# Patient Record
Sex: Male | Born: 1950
Health system: Southern US, Community
[De-identification: ages and names within clinical notes are randomized; demographics above are authoritative.]

## PROBLEM LIST (undated history)

## (undated) DIAGNOSIS — B20 Human immunodeficiency virus [HIV] disease: Secondary | ICD-10-CM

## (undated) DIAGNOSIS — E119 Type 2 diabetes mellitus without complications: Secondary | ICD-10-CM

## (undated) DIAGNOSIS — E7800 Pure hypercholesterolemia, unspecified: Secondary | ICD-10-CM

## (undated) DIAGNOSIS — I4891 Unspecified atrial fibrillation: Secondary | ICD-10-CM

## (undated) DIAGNOSIS — I10 Essential (primary) hypertension: Secondary | ICD-10-CM

## (undated) DIAGNOSIS — Z21 Asymptomatic human immunodeficiency virus [HIV] infection status: Secondary | ICD-10-CM

## (undated) DIAGNOSIS — I2510 Atherosclerotic heart disease of native coronary artery without angina pectoris: Secondary | ICD-10-CM

## (undated) HISTORY — PX: CIRCUMCISION: SUR203

## (undated) HISTORY — PX: CARDIAC SURGERY: SHX584

## (undated) HISTORY — PX: CORONARY ARTERY BYPASS GRAFT: SHX141

## (undated) HISTORY — DX: Unspecified atrial fibrillation: I48.91

## (undated) HISTORY — DX: Atherosclerotic heart disease of native coronary artery without angina pectoris: I25.10

---

## 1999-06-02 ENCOUNTER — Encounter (INDEPENDENT_AMBULATORY_CARE_PROVIDER_SITE_OTHER): Payer: Self-pay | Admitting: *Deleted

## 1999-06-02 ENCOUNTER — Inpatient Hospital Stay (HOSPITAL_COMMUNITY): Admission: EM | Admit: 1999-06-02 | Discharge: 1999-06-16 | Payer: Self-pay | Admitting: *Deleted

## 1999-06-02 LAB — CONVERTED CEMR LAB
CD4 Count: 40 microliters
CD4 T Cell Abs: 40

## 1999-06-03 ENCOUNTER — Encounter: Payer: Self-pay | Admitting: *Deleted

## 1999-06-03 ENCOUNTER — Encounter: Payer: Self-pay | Admitting: Thoracic Surgery (Cardiothoracic Vascular Surgery)

## 1999-06-04 ENCOUNTER — Encounter: Payer: Self-pay | Admitting: Thoracic Surgery (Cardiothoracic Vascular Surgery)

## 1999-06-05 ENCOUNTER — Encounter: Payer: Self-pay | Admitting: Thoracic Surgery (Cardiothoracic Vascular Surgery)

## 1999-06-07 ENCOUNTER — Encounter: Payer: Self-pay | Admitting: Thoracic Surgery (Cardiothoracic Vascular Surgery)

## 1999-06-09 ENCOUNTER — Encounter (HOSPITAL_COMMUNITY): Payer: Self-pay | Admitting: Dentistry

## 1999-06-09 ENCOUNTER — Encounter: Payer: Self-pay | Admitting: Thoracic Surgery (Cardiothoracic Vascular Surgery)

## 1999-06-12 ENCOUNTER — Encounter: Payer: Self-pay | Admitting: Cardiothoracic Surgery

## 1999-06-15 ENCOUNTER — Encounter: Payer: Self-pay | Admitting: Internal Medicine

## 1999-07-01 ENCOUNTER — Encounter (HOSPITAL_COMMUNITY): Admission: RE | Admit: 1999-07-01 | Discharge: 1999-09-29 | Payer: Self-pay | Admitting: Dentistry

## 1999-08-02 ENCOUNTER — Encounter: Admission: RE | Admit: 1999-08-02 | Discharge: 1999-08-02 | Payer: Self-pay | Admitting: Infectious Diseases

## 1999-08-02 ENCOUNTER — Ambulatory Visit (HOSPITAL_COMMUNITY): Admission: RE | Admit: 1999-08-02 | Discharge: 1999-08-02 | Payer: Self-pay | Admitting: Infectious Diseases

## 1999-08-18 ENCOUNTER — Encounter: Admission: RE | Admit: 1999-08-18 | Discharge: 1999-08-18 | Payer: Self-pay | Admitting: Infectious Diseases

## 1999-09-29 ENCOUNTER — Encounter: Admission: RE | Admit: 1999-09-29 | Discharge: 1999-09-29 | Payer: Self-pay | Admitting: Infectious Diseases

## 1999-09-29 ENCOUNTER — Ambulatory Visit (HOSPITAL_COMMUNITY): Admission: RE | Admit: 1999-09-29 | Discharge: 1999-09-29 | Payer: Self-pay | Admitting: Infectious Diseases

## 1999-12-01 ENCOUNTER — Encounter: Admission: RE | Admit: 1999-12-01 | Discharge: 1999-12-01 | Payer: Self-pay | Admitting: Infectious Diseases

## 2000-01-06 ENCOUNTER — Encounter: Admission: RE | Admit: 2000-01-06 | Discharge: 2000-01-06 | Payer: Self-pay | Admitting: Infectious Diseases

## 2000-01-16 ENCOUNTER — Ambulatory Visit (HOSPITAL_COMMUNITY): Admission: RE | Admit: 2000-01-16 | Discharge: 2000-01-16 | Payer: Self-pay | Admitting: Infectious Diseases

## 2000-01-16 ENCOUNTER — Encounter: Admission: RE | Admit: 2000-01-16 | Discharge: 2000-01-16 | Payer: Self-pay | Admitting: Infectious Diseases

## 2000-02-09 ENCOUNTER — Encounter: Admission: RE | Admit: 2000-02-09 | Discharge: 2000-02-09 | Payer: Self-pay | Admitting: Infectious Diseases

## 2000-06-19 ENCOUNTER — Encounter: Admission: RE | Admit: 2000-06-19 | Discharge: 2000-06-19 | Payer: Self-pay | Admitting: Infectious Diseases

## 2000-06-19 ENCOUNTER — Ambulatory Visit (HOSPITAL_COMMUNITY): Admission: RE | Admit: 2000-06-19 | Discharge: 2000-06-19 | Payer: Self-pay | Admitting: Infectious Diseases

## 2000-07-03 ENCOUNTER — Encounter: Admission: RE | Admit: 2000-07-03 | Discharge: 2000-07-03 | Payer: Self-pay | Admitting: Infectious Diseases

## 2000-07-05 ENCOUNTER — Encounter: Admission: RE | Admit: 2000-07-05 | Discharge: 2000-07-05 | Payer: Self-pay | Admitting: Infectious Diseases

## 2000-09-20 ENCOUNTER — Encounter: Admission: RE | Admit: 2000-09-20 | Discharge: 2000-09-20 | Payer: Self-pay | Admitting: Infectious Diseases

## 2000-09-20 ENCOUNTER — Ambulatory Visit (HOSPITAL_COMMUNITY): Admission: RE | Admit: 2000-09-20 | Discharge: 2000-09-20 | Payer: Self-pay | Admitting: Infectious Diseases

## 2000-10-11 ENCOUNTER — Encounter: Admission: RE | Admit: 2000-10-11 | Discharge: 2000-10-11 | Payer: Self-pay | Admitting: Infectious Diseases

## 2001-02-07 ENCOUNTER — Encounter: Admission: RE | Admit: 2001-02-07 | Discharge: 2001-02-07 | Payer: Self-pay | Admitting: Infectious Diseases

## 2001-03-21 ENCOUNTER — Ambulatory Visit (HOSPITAL_COMMUNITY): Admission: RE | Admit: 2001-03-21 | Discharge: 2001-03-21 | Payer: Self-pay | Admitting: Infectious Diseases

## 2001-03-21 ENCOUNTER — Encounter: Admission: RE | Admit: 2001-03-21 | Discharge: 2001-03-21 | Payer: Self-pay | Admitting: Infectious Diseases

## 2001-04-04 ENCOUNTER — Encounter: Admission: RE | Admit: 2001-04-04 | Discharge: 2001-04-04 | Payer: Self-pay | Admitting: Infectious Diseases

## 2001-06-20 ENCOUNTER — Encounter: Admission: RE | Admit: 2001-06-20 | Discharge: 2001-06-20 | Payer: Self-pay | Admitting: Internal Medicine

## 2001-06-20 ENCOUNTER — Ambulatory Visit (HOSPITAL_COMMUNITY): Admission: RE | Admit: 2001-06-20 | Discharge: 2001-06-20 | Payer: Self-pay | Admitting: Infectious Diseases

## 2002-04-16 ENCOUNTER — Encounter: Admission: RE | Admit: 2002-04-16 | Discharge: 2002-04-16 | Payer: Self-pay | Admitting: Infectious Diseases

## 2002-05-01 ENCOUNTER — Encounter: Admission: RE | Admit: 2002-05-01 | Discharge: 2002-05-01 | Payer: Self-pay | Admitting: Infectious Diseases

## 2002-10-09 ENCOUNTER — Encounter: Admission: RE | Admit: 2002-10-09 | Discharge: 2002-10-09 | Payer: Self-pay | Admitting: Infectious Diseases

## 2002-10-09 ENCOUNTER — Encounter: Payer: Self-pay | Admitting: Infectious Diseases

## 2002-10-09 ENCOUNTER — Ambulatory Visit (HOSPITAL_COMMUNITY): Admission: RE | Admit: 2002-10-09 | Discharge: 2002-10-09 | Payer: Self-pay | Admitting: Infectious Diseases

## 2003-03-13 ENCOUNTER — Encounter: Admission: RE | Admit: 2003-03-13 | Discharge: 2003-03-13 | Payer: Self-pay | Admitting: Infectious Diseases

## 2003-03-27 ENCOUNTER — Encounter: Admission: RE | Admit: 2003-03-27 | Discharge: 2003-03-27 | Payer: Self-pay | Admitting: Infectious Diseases

## 2003-06-25 ENCOUNTER — Encounter: Admission: RE | Admit: 2003-06-25 | Discharge: 2003-06-25 | Payer: Self-pay | Admitting: Infectious Diseases

## 2003-06-25 ENCOUNTER — Ambulatory Visit (HOSPITAL_COMMUNITY): Admission: RE | Admit: 2003-06-25 | Discharge: 2003-06-25 | Payer: Self-pay | Admitting: Infectious Diseases

## 2003-07-09 ENCOUNTER — Encounter: Admission: RE | Admit: 2003-07-09 | Discharge: 2003-07-09 | Payer: Self-pay | Admitting: Infectious Diseases

## 2003-10-08 ENCOUNTER — Ambulatory Visit (HOSPITAL_COMMUNITY): Admission: RE | Admit: 2003-10-08 | Discharge: 2003-10-08 | Payer: Self-pay | Admitting: Infectious Diseases

## 2003-10-08 ENCOUNTER — Encounter: Admission: RE | Admit: 2003-10-08 | Discharge: 2003-10-08 | Payer: Self-pay | Admitting: Infectious Diseases

## 2004-04-07 ENCOUNTER — Ambulatory Visit (HOSPITAL_COMMUNITY): Admission: RE | Admit: 2004-04-07 | Discharge: 2004-04-07 | Payer: Self-pay | Admitting: Infectious Diseases

## 2004-04-07 ENCOUNTER — Ambulatory Visit: Payer: Self-pay | Admitting: Internal Medicine

## 2004-04-21 ENCOUNTER — Ambulatory Visit: Payer: Self-pay | Admitting: Infectious Diseases

## 2004-07-11 ENCOUNTER — Ambulatory Visit: Payer: Self-pay | Admitting: Infectious Diseases

## 2004-08-08 ENCOUNTER — Ambulatory Visit: Payer: Self-pay | Admitting: Infectious Diseases

## 2004-08-08 ENCOUNTER — Ambulatory Visit (HOSPITAL_COMMUNITY): Admission: RE | Admit: 2004-08-08 | Discharge: 2004-08-08 | Payer: Self-pay | Admitting: Infectious Diseases

## 2004-09-01 ENCOUNTER — Ambulatory Visit: Payer: Self-pay | Admitting: Infectious Diseases

## 2004-12-16 ENCOUNTER — Ambulatory Visit (HOSPITAL_COMMUNITY): Admission: RE | Admit: 2004-12-16 | Discharge: 2004-12-16 | Payer: Self-pay | Admitting: Infectious Diseases

## 2004-12-16 ENCOUNTER — Ambulatory Visit: Payer: Self-pay | Admitting: Infectious Diseases

## 2005-02-02 ENCOUNTER — Ambulatory Visit: Payer: Self-pay | Admitting: Infectious Diseases

## 2005-05-03 ENCOUNTER — Ambulatory Visit: Payer: Self-pay | Admitting: Infectious Diseases

## 2005-05-03 ENCOUNTER — Encounter: Admission: RE | Admit: 2005-05-03 | Discharge: 2005-05-03 | Payer: Self-pay | Admitting: Infectious Diseases

## 2005-05-18 ENCOUNTER — Ambulatory Visit: Payer: Self-pay | Admitting: Infectious Diseases

## 2005-08-09 ENCOUNTER — Encounter (INDEPENDENT_AMBULATORY_CARE_PROVIDER_SITE_OTHER): Payer: Self-pay | Admitting: *Deleted

## 2005-08-09 ENCOUNTER — Ambulatory Visit: Payer: Self-pay | Admitting: Infectious Diseases

## 2005-08-09 ENCOUNTER — Encounter: Admission: RE | Admit: 2005-08-09 | Discharge: 2005-08-09 | Payer: Self-pay | Admitting: Infectious Diseases

## 2005-10-19 ENCOUNTER — Ambulatory Visit: Payer: Self-pay | Admitting: Infectious Diseases

## 2005-11-09 ENCOUNTER — Encounter: Admission: RE | Admit: 2005-11-09 | Discharge: 2005-11-09 | Payer: Self-pay | Admitting: Infectious Diseases

## 2005-11-09 ENCOUNTER — Encounter (INDEPENDENT_AMBULATORY_CARE_PROVIDER_SITE_OTHER): Payer: Self-pay | Admitting: *Deleted

## 2005-11-09 ENCOUNTER — Ambulatory Visit: Payer: Self-pay | Admitting: Infectious Diseases

## 2005-11-09 LAB — CONVERTED CEMR LAB: CD4 Count: 440 microliters

## 2005-11-30 ENCOUNTER — Ambulatory Visit: Payer: Self-pay | Admitting: Infectious Diseases

## 2006-03-03 DIAGNOSIS — B182 Chronic viral hepatitis C: Secondary | ICD-10-CM | POA: Insufficient documentation

## 2006-03-03 DIAGNOSIS — I2510 Atherosclerotic heart disease of native coronary artery without angina pectoris: Secondary | ICD-10-CM

## 2006-03-03 DIAGNOSIS — B20 Human immunodeficiency virus [HIV] disease: Secondary | ICD-10-CM

## 2006-03-03 DIAGNOSIS — E119 Type 2 diabetes mellitus without complications: Secondary | ICD-10-CM | POA: Insufficient documentation

## 2006-03-03 DIAGNOSIS — F17200 Nicotine dependence, unspecified, uncomplicated: Secondary | ICD-10-CM

## 2006-03-14 ENCOUNTER — Encounter: Payer: Self-pay | Admitting: Infectious Diseases

## 2006-03-27 ENCOUNTER — Encounter (INDEPENDENT_AMBULATORY_CARE_PROVIDER_SITE_OTHER): Payer: Self-pay | Admitting: *Deleted

## 2006-03-27 ENCOUNTER — Ambulatory Visit: Payer: Self-pay | Admitting: Infectious Diseases

## 2006-03-27 ENCOUNTER — Encounter: Admission: RE | Admit: 2006-03-27 | Discharge: 2006-03-27 | Payer: Self-pay | Admitting: Infectious Diseases

## 2006-03-27 LAB — CONVERTED CEMR LAB
Basophils Absolute: 0 10*3/uL (ref 0.0–0.1)
CD4 Count: 310 microliters
CO2: 27 meq/L (ref 19–32)
Calcium: 9.2 mg/dL (ref 8.4–10.5)
Glucose, Bld: 152 mg/dL — ABNORMAL HIGH (ref 70–99)
HCT: 39.7 % (ref 39.0–52.0)
Hemoglobin, Urine: NEGATIVE
Hemoglobin: 14 g/dL (ref 13.0–17.0)
Leukocytes, UA: NEGATIVE
Lymphocytes Relative: 40 % (ref 12–46)
Lymphs Abs: 1 10*3/uL (ref 0.7–3.3)
MCHC: 35.3 g/dL (ref 30.0–36.0)
MCV: 112.5 fL — ABNORMAL HIGH (ref 78.0–100.0)
Monocytes Absolute: 0.2 10*3/uL (ref 0.2–0.7)
Neutrophils Relative %: 38 % — ABNORMAL LOW (ref 43–77)
Platelets: 144 10*3/uL — ABNORMAL LOW (ref 150–400)
Potassium: 4.1 meq/L (ref 3.5–5.3)
Protein, ur: NEGATIVE mg/dL
RBC: 3.53 M/uL — ABNORMAL LOW (ref 4.22–5.81)
RDW: 13.6 % (ref 11.5–14.0)
Sodium: 141 meq/L (ref 135–145)
Specific Gravity, Urine: 1.025 (ref 1.005–1.03)
Total Bilirubin: 0.8 mg/dL (ref 0.3–1.2)
Total Protein: 7.4 g/dL (ref 6.0–8.3)
Urine Glucose: NEGATIVE mg/dL
Urobilinogen, UA: 0.2 (ref 0.0–1.0)
VLDL: 26 mg/dL (ref 0–40)

## 2006-04-12 ENCOUNTER — Ambulatory Visit: Payer: Self-pay | Admitting: Infectious Diseases

## 2006-04-16 ENCOUNTER — Encounter (INDEPENDENT_AMBULATORY_CARE_PROVIDER_SITE_OTHER): Payer: Self-pay | Admitting: *Deleted

## 2006-04-16 LAB — CONVERTED CEMR LAB

## 2006-04-29 ENCOUNTER — Encounter (INDEPENDENT_AMBULATORY_CARE_PROVIDER_SITE_OTHER): Payer: Self-pay | Admitting: *Deleted

## 2006-05-02 ENCOUNTER — Telehealth: Payer: Self-pay | Admitting: Infectious Diseases

## 2006-06-29 ENCOUNTER — Encounter: Payer: Self-pay | Admitting: Infectious Diseases

## 2006-09-06 ENCOUNTER — Encounter (INDEPENDENT_AMBULATORY_CARE_PROVIDER_SITE_OTHER): Payer: Self-pay | Admitting: *Deleted

## 2006-11-06 ENCOUNTER — Telehealth: Payer: Self-pay | Admitting: Infectious Diseases

## 2006-12-20 ENCOUNTER — Telehealth: Payer: Self-pay | Admitting: Infectious Diseases

## 2007-01-24 ENCOUNTER — Encounter: Payer: Self-pay | Admitting: Infectious Diseases

## 2007-01-29 ENCOUNTER — Encounter: Payer: Self-pay | Admitting: Infectious Diseases

## 2007-02-20 ENCOUNTER — Encounter (INDEPENDENT_AMBULATORY_CARE_PROVIDER_SITE_OTHER): Payer: Self-pay | Admitting: *Deleted

## 2007-05-15 ENCOUNTER — Encounter (INDEPENDENT_AMBULATORY_CARE_PROVIDER_SITE_OTHER): Payer: Self-pay | Admitting: *Deleted

## 2007-05-15 ENCOUNTER — Ambulatory Visit: Payer: Self-pay | Admitting: Infectious Diseases

## 2007-05-15 ENCOUNTER — Encounter: Admission: RE | Admit: 2007-05-15 | Discharge: 2007-05-15 | Payer: Self-pay | Admitting: Infectious Diseases

## 2007-05-15 LAB — CONVERTED CEMR LAB
Alkaline Phosphatase: 59 units/L (ref 39–117)
Basophils Absolute: 0 10*3/uL (ref 0.0–0.1)
Basophils Relative: 0 % (ref 0–1)
Bilirubin Urine: NEGATIVE
Chloride: 107 meq/L (ref 96–112)
Eosinophils Relative: 3 % (ref 0–5)
HDL: 74 mg/dL (ref >39)
Hemoglobin, Urine: NEGATIVE
Hemoglobin: 12 g/dL — ABNORMAL LOW (ref 13.0–17.0)
Ketones, ur: NEGATIVE mg/dL
LDL Cholesterol: 69 mg/dL (ref 0–99)
Lymphocytes Relative: 34 % (ref 12–46)
Lymphs Abs: 0.9 10*3/uL (ref 0.7–4.0)
MCV: 113.3 fL — ABNORMAL HIGH (ref 78.0–100.0)
Platelets: 108 10*3/uL — ABNORMAL LOW (ref 150–400)
Potassium: 4.1 meq/L (ref 3.5–5.3)
Protein, ur: NEGATIVE mg/dL
RBC: 3.01 M/uL — ABNORMAL LOW (ref 4.22–5.81)
RDW: 13.6 % (ref 11.5–15.5)
Sodium: 141 meq/L (ref 135–145)
Total Bilirubin: 0.9 mg/dL (ref 0.3–1.2)
Triglycerides: 29 mg/dL (ref <150)
Urobilinogen, UA: 1 (ref 0.0–1.0)
VLDL: 6 mg/dL (ref 0–40)
WBC: 2.6 10*3/uL — ABNORMAL LOW (ref 4.0–10.5)

## 2007-06-04 ENCOUNTER — Encounter (INDEPENDENT_AMBULATORY_CARE_PROVIDER_SITE_OTHER): Payer: Self-pay | Admitting: *Deleted

## 2007-07-16 ENCOUNTER — Telehealth: Payer: Self-pay | Admitting: Infectious Diseases

## 2007-07-17 ENCOUNTER — Telehealth: Payer: Self-pay | Admitting: Infectious Diseases

## 2007-09-11 ENCOUNTER — Encounter: Payer: Self-pay | Admitting: Infectious Diseases

## 2007-10-24 ENCOUNTER — Ambulatory Visit: Payer: Self-pay | Admitting: Infectious Diseases

## 2007-10-24 LAB — CONVERTED CEMR LAB
AST: 38 units/L — ABNORMAL HIGH (ref 0–37)
Albumin: 4.1 g/dL (ref 3.5–5.2)
Alkaline Phosphatase: 54 units/L (ref 39–117)
BUN: 21 mg/dL (ref 6–23)
Basophils Absolute: 0 10*3/uL (ref 0.0–0.1)
CO2: 27 meq/L (ref 19–32)
Calcium: 9.4 mg/dL (ref 8.4–10.5)
Cholesterol: 182 mg/dL (ref 0–200)
Creatinine, Ser: 1.02 mg/dL (ref 0.40–1.50)
Eosinophils Absolute: 0.1 10*3/uL (ref 0.0–0.7)
Eosinophils Relative: 5 % (ref 0–5)
Glucose, Bld: 82 mg/dL (ref 70–99)
LDL Cholesterol: 71 mg/dL (ref 0–99)
Leukocytes, UA: NEGATIVE
Lymphocytes Relative: 46 % (ref 12–46)
MCHC: 36.1 g/dL — ABNORMAL HIGH (ref 30.0–36.0)
MCV: 112.5 fL — ABNORMAL HIGH (ref 78.0–100.0)
Monocytes Absolute: 0.3 10*3/uL (ref 0.1–1.0)
Neutro Abs: 0.9 10*3/uL — ABNORMAL LOW (ref 1.7–7.7)
Protein, ur: NEGATIVE mg/dL
Sodium: 141 meq/L (ref 135–145)
Total Bilirubin: 0.7 mg/dL (ref 0.3–1.2)
Total Protein: 7.3 g/dL (ref 6.0–8.3)
VLDL: 15 mg/dL (ref 0–40)
WBC: 2.5 10*3/uL — ABNORMAL LOW (ref 4.0–10.5)
pH: 6 (ref 5.0–8.0)

## 2008-02-19 ENCOUNTER — Encounter: Payer: Self-pay | Admitting: Infectious Diseases

## 2008-02-26 ENCOUNTER — Encounter: Payer: Self-pay | Admitting: Infectious Diseases

## 2008-05-06 ENCOUNTER — Encounter (INDEPENDENT_AMBULATORY_CARE_PROVIDER_SITE_OTHER): Payer: Self-pay | Admitting: *Deleted

## 2008-06-02 ENCOUNTER — Encounter (INDEPENDENT_AMBULATORY_CARE_PROVIDER_SITE_OTHER): Payer: Self-pay | Admitting: *Deleted

## 2008-06-05 ENCOUNTER — Encounter: Payer: Self-pay | Admitting: Infectious Diseases

## 2008-06-23 ENCOUNTER — Telehealth: Payer: Self-pay | Admitting: Infectious Diseases

## 2008-06-29 ENCOUNTER — Encounter: Payer: Self-pay | Admitting: Infectious Diseases

## 2008-08-20 ENCOUNTER — Ambulatory Visit: Payer: Self-pay | Admitting: Infectious Diseases

## 2008-08-20 LAB — CONVERTED CEMR LAB
Alkaline Phosphatase: 64 units/L (ref 39–117)
Basophils Absolute: 0 10*3/uL (ref 0.0–0.1)
Basophils Relative: 0 % (ref 0–1)
Calcium: 8.9 mg/dL (ref 8.4–10.5)
Chloride: 107 meq/L (ref 96–112)
Cholesterol: 177 mg/dL (ref 0–200)
Eosinophils Absolute: 0.2 10*3/uL (ref 0.0–0.7)
HCT: 38.2 % — ABNORMAL LOW (ref 39.0–52.0)
HDL: 97 mg/dL (ref >39)
HIV 1 RNA Quant: <48 copies/mL (ref <48)
LDL Cholesterol: 50 mg/dL (ref 0–99)
Lymphocytes Relative: 33 % (ref 12–46)
Lymphs Abs: 0.8 10*3/uL (ref 0.7–4.0)
Monocytes Absolute: 0.2 10*3/uL (ref 0.1–1.0)
Monocytes Relative: 8 % (ref 3–12)
Neutro Abs: 1.3 10*3/uL — ABNORMAL LOW (ref 1.7–7.7)
Neutrophils Relative %: 52 % (ref 43–77)
RBC: 3.45 M/uL — ABNORMAL LOW (ref 4.22–5.81)
RDW: 13.8 % (ref 11.5–15.5)
Total CHOL/HDL Ratio: 1.8

## 2008-09-03 ENCOUNTER — Ambulatory Visit: Payer: Self-pay | Admitting: Infectious Diseases

## 2008-12-16 ENCOUNTER — Telehealth (INDEPENDENT_AMBULATORY_CARE_PROVIDER_SITE_OTHER): Payer: Self-pay | Admitting: *Deleted

## 2008-12-16 ENCOUNTER — Encounter: Payer: Self-pay | Admitting: Infectious Diseases

## 2008-12-18 ENCOUNTER — Encounter: Payer: Self-pay | Admitting: Infectious Diseases

## 2008-12-28 ENCOUNTER — Encounter: Payer: Self-pay | Admitting: Infectious Diseases

## 2009-03-24 ENCOUNTER — Ambulatory Visit: Payer: Self-pay | Admitting: Infectious Diseases

## 2009-03-24 LAB — CONVERTED CEMR LAB
Albumin: 3.8 g/dL (ref 3.5–5.2)
Alkaline Phosphatase: 65 units/L (ref 39–117)
Basophils Absolute: 0 10*3/uL (ref 0.0–0.1)
Basophils Relative: 1 % (ref 0–1)
Eosinophils Absolute: 0.1 10*3/uL (ref 0.0–0.7)
HCT: 38.9 % — ABNORMAL LOW (ref 39.0–52.0)
HIV 1 RNA Quant: 376 copies/mL — ABNORMAL HIGH (ref <48)
HIV-1 RNA Quant, Log: 2.58 — ABNORMAL HIGH (ref <1.68)
Lymphocytes Relative: 35 % (ref 12–46)
Lymphs Abs: 1.5 10*3/uL (ref 0.7–4.0)
MCHC: 35.5 g/dL (ref 30.0–36.0)
MCV: 112.1 fL — ABNORMAL HIGH (ref >=78.0)
Monocytes Absolute: 0.5 10*3/uL (ref 0.1–1.0)
Monocytes Relative: 11 % (ref 3–12)
Neutro Abs: 2.1 10*3/uL (ref 1.7–7.7)
Platelets: 137 10*3/uL — ABNORMAL LOW (ref 150–400)
RDW: 13.4 % (ref 11.5–15.5)
WBC: 4.1 10*3/uL (ref 4.0–10.5)

## 2009-04-27 ENCOUNTER — Encounter (INDEPENDENT_AMBULATORY_CARE_PROVIDER_SITE_OTHER): Payer: Self-pay | Admitting: *Deleted

## 2009-06-29 ENCOUNTER — Encounter: Payer: Self-pay | Admitting: Infectious Diseases

## 2009-07-22 ENCOUNTER — Telehealth (INDEPENDENT_AMBULATORY_CARE_PROVIDER_SITE_OTHER): Payer: Self-pay | Admitting: *Deleted

## 2009-07-27 ENCOUNTER — Telehealth (INDEPENDENT_AMBULATORY_CARE_PROVIDER_SITE_OTHER): Payer: Self-pay | Admitting: *Deleted

## 2009-08-14 ENCOUNTER — Encounter: Payer: Self-pay | Admitting: Infectious Diseases

## 2009-09-03 ENCOUNTER — Encounter: Payer: Self-pay | Admitting: Infectious Diseases

## 2009-09-14 ENCOUNTER — Ambulatory Visit: Payer: Self-pay | Admitting: Infectious Diseases

## 2009-09-14 LAB — CONVERTED CEMR LAB
Albumin: 4.1 g/dL (ref 3.5–5.2)
Basophils Relative: 0 % (ref 0–1)
Calcium: 9.2 mg/dL (ref 8.4–10.5)
LDL Cholesterol: 81 mg/dL (ref 0–99)
Lymphocytes Relative: 50 % — ABNORMAL HIGH (ref 12–46)
Monocytes Relative: 13 % — ABNORMAL HIGH (ref 3–12)
Neutro Abs: 0.7 10*3/uL — ABNORMAL LOW (ref 1.7–7.7)
Potassium: 4.1 meq/L (ref 3.5–5.3)
Sodium: 140 meq/L (ref 135–145)
Total Bilirubin: 0.8 mg/dL (ref 0.3–1.2)
Total CHOL/HDL Ratio: 2
Total Protein: 7.2 g/dL (ref 6.0–8.3)
Triglycerides: 89 mg/dL (ref <150)
VLDL: 18 mg/dL (ref 0–40)
WBC: 2.4 10*3/uL — ABNORMAL LOW (ref 4.0–10.5)

## 2009-09-21 ENCOUNTER — Emergency Department (HOSPITAL_COMMUNITY): Admission: EM | Admit: 2009-09-21 | Discharge: 2009-09-21 | Payer: Self-pay | Admitting: Emergency Medicine

## 2009-11-17 ENCOUNTER — Encounter: Payer: Self-pay | Admitting: Infectious Diseases

## 2009-11-25 ENCOUNTER — Ambulatory Visit: Payer: Self-pay | Admitting: Infectious Diseases

## 2009-12-07 ENCOUNTER — Encounter (INDEPENDENT_AMBULATORY_CARE_PROVIDER_SITE_OTHER): Payer: Self-pay | Admitting: *Deleted

## 2010-01-05 ENCOUNTER — Telehealth: Payer: Self-pay | Admitting: Infectious Diseases

## 2010-01-05 ENCOUNTER — Telehealth (INDEPENDENT_AMBULATORY_CARE_PROVIDER_SITE_OTHER): Payer: Self-pay | Admitting: *Deleted

## 2010-01-06 ENCOUNTER — Telehealth (INDEPENDENT_AMBULATORY_CARE_PROVIDER_SITE_OTHER): Payer: Self-pay | Admitting: *Deleted

## 2010-01-11 ENCOUNTER — Encounter (INDEPENDENT_AMBULATORY_CARE_PROVIDER_SITE_OTHER): Payer: Self-pay | Admitting: *Deleted

## 2010-01-27 ENCOUNTER — Telehealth (INDEPENDENT_AMBULATORY_CARE_PROVIDER_SITE_OTHER): Payer: Self-pay | Admitting: *Deleted

## 2010-01-27 ENCOUNTER — Ambulatory Visit: Payer: Self-pay | Admitting: Infectious Diseases

## 2010-03-22 NOTE — Progress Notes (Signed)
Summary: pt. requesting "diabetic shoe" referral, needs foot exam & A1C  Phone Note Call from Patient Call back at Home Phone [phone]   Caller: Patient Call For: Lina Sayre MD Reason for Call: Talk to Nurse Summary of Call: Requesting "diabetic shoes" form to be completed by Dr. Maurice March.  Form from [employer] indicates that the pt. needs a diabetic foot exam that indicates the needed for the form.  RN attempted to call the pt., no answer and no way to leave a message. Jennet Maduro RN  January 05, 2010 12:33 PM RN phoned pt.  Pt. made appt to come to see the RN for a diabetic foot exam.  Needs to have A1C drawn and medication review during visit. Jennet Maduro RN  January 05, 2010 2:38 PM

## 2010-03-22 NOTE — Progress Notes (Signed)
Summary: refill/mld  Phone Note Refill Request Message from:  Fax from Pharmacy  Refills Requested: Medication #1:  METOPROLOL TARTRATE 50 MG TABS two times a day   Last Refilled: 07/09/2009 Received a fax refill request from [facility].   Next Appointment Scheduled: None scheduled Initial call taken by: Paulo Fruit  BS,CPht II,MPH,  July 27, 2009 8:52 AM    Prescriptions: METOPROLOL TARTRATE 50 MG TABS (METOPROLOL TARTRATE) two times a day  #60 x 1   Entered by:   Paulo Fruit  BS,CPht II,MPH   Authorized by:   Lina Sayre MD   Signed by:   Paulo Fruit  BS,CPht II,MPH on 07/27/2009   Method used:   Electronically to        [facility] (retail)       [address]       [place], [place]  [address]       Ph: [phone]       Fax: [phone]   RxID:   [id number]  Paulo Fruit  BS,CPht II,MPH  July 27, 2009 8:53 AM

## 2010-03-22 NOTE — Miscellaneous (Signed)
Summary: [facility]: Diabetes Testing Supplies  [facility]: Diabetes Testing Supplies   Imported By: Florinda Marker 12/08/2009 10:47:57  _____________________________________________________________________  External Attachment:    Type:   Image     Comment:   External Document

## 2010-03-22 NOTE — Miscellaneous (Signed)
  Clinical Lists Changes  Observations: Added new observation of YEARAIDSPOS: 2006  (01/11/2010 16:25) Added new observation of HIV STATUS: CDC-defined AIDS  (01/11/2010 16:25)

## 2010-03-22 NOTE — Progress Notes (Signed)
Summary: Pt. needs to come to 11/22 appt. for Diabetic foot exam  Phone Note Call from Patient Call back at Home Phone [phone]   Caller: Patient Details for Reason: [phone] Summary of Call: Patient states that he is returning a phone call concerning paperwork that was to be filled out. phone number left was [phone] Initial call taken by: Jimmy Footman, CMA,  January 06, 2010 12:11 PM  Follow-up for Phone Call        Pt. needs to come to the scheduled appt. on 01/11/10 to have a diabetic foot assessment completed prior to the paperwork being completed.  Jennet Maduro RN  January 07, 2010 11:18 AM

## 2010-03-22 NOTE — Medication Information (Signed)
Summary: [facility]: RX  [facility]: RX   Imported By: Florinda Marker 08/26/2009 15:14:04  _____________________________________________________________________  External Attachment:    Type:   Image     Comment:   External Document

## 2010-03-22 NOTE — Progress Notes (Signed)
Summary: refill  Phone Note From Pharmacy   Reason for Call: Needs renewal Initial call taken by: Jennet Maduro RN,  January 05, 2010 10:54 AM    Prescriptions: VIAGRA 100 MG  TABS (SILDENAFIL CITRATE)   #1 x 11   Entered by:   Jennet Maduro RN   Authorized by:   Lina Sayre MD   Signed by:   Jennet Maduro RN on 01/05/2010   Method used:   Telephoned to ...       [facility] (retail)       [address]       [place], [place]  [address]       Ph: [phone]       Fax: [phone]   RxID:   [id number]

## 2010-03-22 NOTE — Medication Information (Signed)
Summary: [facility]: RX  [facility]: RX   Imported By: Florinda Marker 07/06/2009 15:33:38  _____________________________________________________________________  External Attachment:    Type:   Image     Comment:   External Document

## 2010-03-22 NOTE — Miscellaneous (Signed)
Summary: medications updated  Clinical Lists Changes  Medications: Added new medication of VERAPAMIL HCL CR 200 MG [id number] (VERAPAMIL HCL) Take 1 capsule by mouth once a day

## 2010-03-22 NOTE — Miscellaneous (Signed)
Summary: Orders Update - labs  Clinical Lists Changes  Orders: Added new Test order of T-Hgb A1C (in-house) [phone]) - Signed Added new Test order of T-Lipid Profile ([id number]) - Signed Added new Test order of T-CBC w/Diff ([id number]) - Signed Added new Test order of T-CD4SP [facility]) (CD4SP) - Signed Added new Test order of T-Comprehensive Metabolic Panel [phone]) - Signed Added new Test order of T-HIV Viral Load [phone]) - Signed Added new Test order of T-RPR (Syphilis) [phone]) - Signed     Process Orders Check Orders Results:     [facility]: Check successful Order queued for requisitioning for [facility]: September 03, 2009 11:59 AM  Tests Sent for requisitioning (September 03, 2009 11:59 AM):     09/07/2009: [facility] -- T-Lipid Profile [phone] (signed)     09/07/2009: [facility] -- T-CBC w/Diff [[id number]] (signed)     09/07/2009: [facility] -- T-Comprehensive Metabolic Panel [[id number]] (signed)     09/07/2009: [facility] -- T-HIV Viral Load [phone] (signed)     09/07/2009: [facility] -- T-RPR (Syphilis) [phone] (signed)   Appended Document: Orders Update - labs  Laboratory Results   Blood Tests   Date/Time Received: Mariea Clonts  September 14, 2009 3:42 PM  Date/Time Reported: Mariea Clonts  September 14, 2009 3:42 PM   HGBA1C: 5.5%   (Normal Range: Non-Diabetic - 3-6%   Control Diabetic - 6-8%)

## 2010-03-22 NOTE — Progress Notes (Signed)
Summary: refill/mld  Phone Note Call from Patient   Caller: Patient Reason for Call: Refill Medication Summary of Call: patient request refills on combivir to [facility] [place]  adap/spap Initial call taken by: Paulo Fruit  BS,CPht II,MPH,  July 22, 2009 8:49 AM    Prescriptions: COMBIVIR 150-300 MG  TABS (LAMIVUDINE-ZIDOVUDINE) Take 1 tablet by mouth two times a day  #60 x 11   Entered by:   Paulo Fruit  BS,CPht II,MPH   Authorized by:   Lina Sayre MD   Signed by:   Paulo Fruit  BS,CPht II,MPH on 07/22/2009   Method used:   Electronically to        [facility] (retail)       [address]       [place], [place]  [address]       Ph: [phone]       Fax: [phone]   RxID:   [id number]  Paulo Fruit  BS,CPht II,MPH  July 22, 2009 8:50 AM

## 2010-03-22 NOTE — Progress Notes (Signed)
Summary: Diabetic Foot Exam needed for Diabetic Shoe application  Phone Note Outgoing Call   Call placed by: Jennet Maduro RN,  January 27, 2010 9:30 AM Call placed to: Patient Action Taken: Phone Call Completed, Appt scheduled Summary of Call: Pt. needs a Diabetic Foot Exam completed for his application for Diabetic Shoes.  Appt. made for Tuesday, Dec. 13 @ 4:00 pm.  Jennet Maduro RN  January 27, 2010 9:32 AM

## 2010-03-22 NOTE — Miscellaneous (Signed)
Summary: clinical update/ryan white N[place]ADAP appr til 05/21/10  Clinical Lists Changes  Observations: Added new observation of AIDSDAP: Yes 2011 (04/27/2009 12:28)

## 2010-03-22 NOTE — Assessment & Plan Note (Signed)
Summary: Anthony Ferguson   CC:  f/u .  Preventive Screening-Counseling & Management  Alcohol-Tobacco     Alcohol drinks/day: 0     Smoking Status: current     Smoking Cessation Counseling: yes     Smoke Cessation Stage: precontemplative     Packs/Day: 0.5     Year Started: 1967  Caffeine-Diet-Exercise     Caffeine use/day: no     Does Patient Exercise: yes     Type of exercise: walking, weights     Exercise (avg: min/session): >60     Times/week: 7   Current Allergies (reviewed today): No known allergies  Vital Signs:  Patient profile:   60 year old male Height:      75 inches (190.50 cm) Weight:      184.25 pounds (83.75 kg) BMI:     23.11 Temp:     98.6 degrees F (37.00 degrees C) oral Pulse rate:   64 / minute BP sitting:   159 / 92  (left arm)  Vitals Entered By: Starleen Arms CMA (November 25, 2009 11:31 AM) CC: f/u  Is Patient Diabetic? Yes Did you bring your meter with you today? No Pain Assessment Patient in pain? no      Nutritional Status BMI of 19 -24 = normal Nutritional Status Detail nl  Does patient need assistance? Functional Status Self care Ambulation Normal    Other Orders: Influenza Vaccine MCR ([address]) Est. Patient Level III ([address]) Future Orders: T-Comprehensive Metabolic Panel ([id number]) ... 03/28/2010 T-CBC w/Diff ([id number]) ... 03/28/2010 T-CD4SP ([facility]) (CD4SP) ... 03/28/2010 T-HIV Viral Load [phone]) ... 03/28/2010 T-Hgb A1C (in-house) [phone]) ... 03/28/2010  Patient Instructions: 1)  Please schedule a follow-up appointment in 4 months. 2)  Be sure to return for lab work one (1) week before your next appointment as scheduled.   Influenza Vaccine    Vaccine Type: Fluvax MCR    Site: right deltoid    Mfr: Novartis    Dose: 0.5 ml    Route: IM    Given by: Kathi Simpers CMA(AAMA)    Exp. Date: 05/22/2010    Lot #: [id number]    VIS given: 09/14/09 version given November 25, 2009.  Flu Vaccine Consent Questions    Do  you have a history of severe allergic reactions to this vaccine? no    Any prior history of allergic reactions to egg and/or gelatin? no    Do you have a sensitivity to the preservative Thimersol? no    Do you have a past history of Guillan-Barre Syndrome? no    Do you currently have an acute febrile illness? no    Have you ever had a severe reaction to latex? no    Vaccine information given and explained to patient? yes   Process Orders Check Orders Results:     [facility]: Check successful Tests Sent for requisitioning (December 01, 2009 4:50 PM):     03/28/2010: [facility] -- T-Comprehensive Metabolic Panel [[id number]] (signed)     03/28/2010: [facility] -- T-CBC w/Diff [[id number]] (signed)     03/28/2010: [facility] -- T-HIV Viral Load [phone] (signed)

## 2010-03-31 ENCOUNTER — Emergency Department (HOSPITAL_COMMUNITY)
Admission: EM | Admit: 2010-03-31 | Discharge: 2010-03-31 | Disposition: A | Discharge status: Home / Self Care | Payer: Medicare HMO | Attending: Emergency Medicine | Admitting: Emergency Medicine

## 2010-03-31 ENCOUNTER — Emergency Department (HOSPITAL_COMMUNITY): Payer: Medicare HMO

## 2010-03-31 DIAGNOSIS — X58XXXA Exposure to other specified factors, initial encounter: Secondary | ICD-10-CM | POA: Insufficient documentation

## 2010-03-31 DIAGNOSIS — I2510 Atherosclerotic heart disease of native coronary artery without angina pectoris: Secondary | ICD-10-CM | POA: Insufficient documentation

## 2010-03-31 DIAGNOSIS — S335XXA Sprain of ligaments of lumbar spine, initial encounter: Secondary | ICD-10-CM | POA: Insufficient documentation

## 2010-03-31 DIAGNOSIS — E119 Type 2 diabetes mellitus without complications: Secondary | ICD-10-CM | POA: Insufficient documentation

## 2010-03-31 DIAGNOSIS — I10 Essential (primary) hypertension: Secondary | ICD-10-CM | POA: Insufficient documentation

## 2010-03-31 DIAGNOSIS — Z79899 Other long term (current) drug therapy: Secondary | ICD-10-CM | POA: Insufficient documentation

## 2010-03-31 DIAGNOSIS — Z21 Asymptomatic human immunodeficiency virus [HIV] infection status: Secondary | ICD-10-CM | POA: Insufficient documentation

## 2010-04-23 ENCOUNTER — Emergency Department (HOSPITAL_COMMUNITY)
Admission: EM | Admit: 2010-04-23 | Discharge: 2010-04-24 | Disposition: A | Discharge status: Home / Self Care | Payer: Medicare HMO | Attending: Emergency Medicine | Admitting: Emergency Medicine

## 2010-04-23 ENCOUNTER — Emergency Department (HOSPITAL_COMMUNITY): Payer: Medicare HMO

## 2010-04-23 DIAGNOSIS — I4891 Unspecified atrial fibrillation: Secondary | ICD-10-CM | POA: Insufficient documentation

## 2010-04-23 DIAGNOSIS — Z79899 Other long term (current) drug therapy: Secondary | ICD-10-CM | POA: Insufficient documentation

## 2010-04-23 DIAGNOSIS — F17200 Nicotine dependence, unspecified, uncomplicated: Secondary | ICD-10-CM | POA: Insufficient documentation

## 2010-04-23 DIAGNOSIS — E119 Type 2 diabetes mellitus without complications: Secondary | ICD-10-CM | POA: Insufficient documentation

## 2010-04-23 DIAGNOSIS — I2510 Atherosclerotic heart disease of native coronary artery without angina pectoris: Secondary | ICD-10-CM | POA: Insufficient documentation

## 2010-04-23 DIAGNOSIS — I10 Essential (primary) hypertension: Secondary | ICD-10-CM | POA: Insufficient documentation

## 2010-04-23 DIAGNOSIS — F1420 Cocaine dependence, uncomplicated: Secondary | ICD-10-CM | POA: Insufficient documentation

## 2010-04-23 LAB — POCT CARDIAC MARKERS: Myoglobin, poc: 321 ng/mL (ref 12–200)

## 2010-04-23 LAB — CBC
HCT: 41.4 % (ref 39.0–52.0)
Hemoglobin: 15.2 g/dL (ref 13.0–17.0)
MCHC: 36.7 g/dL — ABNORMAL HIGH (ref 30.0–36.0)
MCV: 108.1 fL — ABNORMAL HIGH (ref 78.0–100.0)
RBC: 3.83 MIL/uL — ABNORMAL LOW (ref 4.22–5.81)
RDW: 13 % (ref 11.5–15.5)
WBC: 4.7 10*3/uL (ref 4.0–10.5)

## 2010-04-23 LAB — BASIC METABOLIC PANEL
CO2: 24 mEq/L (ref 19–32)
Calcium: 9.7 mg/dL (ref 8.4–10.5)
Chloride: 99 mEq/L (ref 96–112)
Creatinine, Ser: 1.09 mg/dL (ref 0.4–1.5)
GFR calc non Af Amer: >60 mL/min (ref >60)
Glucose, Bld: 182 mg/dL — ABNORMAL HIGH (ref 70–99)
Potassium: 3.7 mEq/L (ref 3.5–5.1)

## 2010-04-23 LAB — RAPID URINE DRUG SCREEN, HOSP PERFORMED
Barbiturates: NOT DETECTED
Benzodiazepines: NOT DETECTED
Opiates: NOT DETECTED

## 2010-04-23 LAB — DIFFERENTIAL
Basophils Absolute: 0 10*3/uL (ref 0.0–0.1)
Lymphocytes Relative: 34 % (ref 12–46)
Lymphs Abs: 1.6 10*3/uL (ref 0.7–4.0)
Monocytes Relative: 13 % — ABNORMAL HIGH (ref 3–12)

## 2010-04-27 ENCOUNTER — Encounter (INDEPENDENT_AMBULATORY_CARE_PROVIDER_SITE_OTHER): Payer: Self-pay | Admitting: *Deleted

## 2010-05-03 NOTE — Miscellaneous (Signed)
  Clinical Lists Changes  Observations: Added new observation of AIDSDAP: PENDING APPROVAL 2012 (04/27/2010 15:36) Added new observation of PCTFPL: 106.08  (04/27/2010 15:36) Added new observation of HOUSEINCOME: [address]  (04/27/2010 15:36) Added new observation of FINASSESSDT: 04/27/2010  (04/27/2010 15:36)

## 2010-05-07 LAB — T-HELPER CELL (CD4) - (RCID CLINIC ONLY): CD4 % Helper T Cell: 37 % (ref 33–55)

## 2010-05-11 LAB — T-HELPER CELL (CD4) - (RCID CLINIC ONLY): CD4 T Cell Abs: 530 uL (ref 400–2700)

## 2010-05-24 ENCOUNTER — Other Ambulatory Visit (INDEPENDENT_AMBULATORY_CARE_PROVIDER_SITE_OTHER): Payer: Medicare Other | Admitting: *Deleted

## 2010-05-24 ENCOUNTER — Other Ambulatory Visit: Payer: Self-pay | Admitting: *Deleted

## 2010-05-24 DIAGNOSIS — I2510 Atherosclerotic heart disease of native coronary artery without angina pectoris: Secondary | ICD-10-CM

## 2010-05-24 MED ORDER — PRAVASTATIN SODIUM 20 MG PO TABS: 20.0000 mg | ORAL_TABLET | Freq: Every day | ORAL | Status: DC

## 2010-05-24 MED ORDER — VERAPAMIL HCL 200 MG PO CP24: 200.0000 mg | ORAL_CAPSULE | Freq: Every day | ORAL | Status: DC

## 2010-05-29 LAB — T-HELPER CELL (CD4) - (RCID CLINIC ONLY): CD4 T Cell Abs: 330 uL — ABNORMAL LOW (ref 400–2700)

## 2010-06-02 ENCOUNTER — Other Ambulatory Visit (INDEPENDENT_AMBULATORY_CARE_PROVIDER_SITE_OTHER): Payer: Medicare Other | Admitting: *Deleted

## 2010-06-02 DIAGNOSIS — I2510 Atherosclerotic heart disease of native coronary artery without angina pectoris: Secondary | ICD-10-CM

## 2010-06-02 MED ORDER — VERAPAMIL HCL 200 MG PO CP24: 200.0000 mg | ORAL_CAPSULE | Freq: Every day | ORAL | Status: DC

## 2010-07-05 ENCOUNTER — Other Ambulatory Visit (INDEPENDENT_AMBULATORY_CARE_PROVIDER_SITE_OTHER): Payer: Medicare Other | Admitting: Infectious Diseases

## 2010-07-05 DIAGNOSIS — B20 Human immunodeficiency virus [HIV] disease: Secondary | ICD-10-CM

## 2010-07-08 NOTE — Consult Note (Signed)
[place]  Patient:    Anthony Ferguson, Anthony Ferguson                     MRN: [id number] Proc. Date: 06/06/99 Adm. Date:  [id number] Attending:  Charlett Lango CC:         Salvatore Decent. Dorris Fetch, M.D.                          Consultation Report  DATE OF BIRTH:  [birth year]  REFERRING PHYSICIANS:  Salvatore Decent. Dorris Fetch, M.D.  HISTORY OF PRESENT ILLNESS:  Anthony Ferguson is a 60 year old black male referred by Salvatore Decent. Dorris Fetch, M.D. for a dental consultation. The patient was admitted with acute MI and subsequently underwent a coronary artery bypass graft on June 03, 1999, with Salvatore Decent. Dorris Fetch, M.D. The patient has experienced a fever of unknown origin, and dental consultation was requested to evaluate the patients dentition as a source of the fever of unknown origin.  PAST MEDICAL HISTORY:  1. Coronary artery disease.     a. History of acute MI and reason for this admission on June 02, 1999.     b. 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 Dorris Fetch, M.D.  2. Diabetes mellitus type 2.  3. History of hypertension.  ALLERGIES/ADVERSE DRUG REACTIONS:  None known.  CURRENT MEDICATIONS: (Per MAR).  1. Regular insulin per sliding scale.  2. Glucotrol XL 10 mg daily.  3. Dulcolax 10 mg daily.  4. Docusate sodium 200 mg daily.  5. Lasix 40 mg daily.  6. Lopressor 25 mg every 12 hours.  7. Chlorhexidine rinse 30 ml twice daily.  8. Enteric-coated aspirin 325 mg daily.  9. K-Dur 20 mEq daily. 10. Vancomycin 1.5 g IV every 12 hours. 11. Cipro IV every 12 hours. 12. Flagyl 500 mg IV every 8 hours.  SOCIAL HISTORY:  The patient with a history of smoking one half pack per day. The patient drinks beer primarily on the weekends.  FAMILY HISTORY:  Noncontributory.  FUNCTIONAL ASSESSMENT:  The patient was independent for all ADLs prior to this admission.  REVIEW OF SYSTEMS:  (Reviewed from the  history and physical/chart - this admission).  DENTAL HISTORY:  CHIEF COMPLAINT:  Dental consultation was requested for the evaluation of dentition as a source of the fever of unknown origin.  HISTORY OF PRESENT ILLNESS:  The patient was admitted with acute MI and subsequently underwent a coronary artery bypass graft heart surgery with Viviann Spare C. Dorris Fetch, M.D. The patient has developed a fever of unknown origin, and consultation was requested to rule out the dentition as a source of the fever of unknown origin.  The patient currently denies toothaches, swellings, or abscess formation. The patient last saw a dentist "several years ago" by patient report. The patient does note that he has "bad teeth".  DENTAL EXAMINATION:  GENERAL:  The patient is a tall, well-developed, well-nourished, black male in no acute distress.  HEAD/NECK:  There is no palpable lymphadenopathy. The patient denies acute TMJ symptoms at this time.  INTRAORAL:  The patient has normal saliva. There is no evidence of soft tissue pathology noted. The patient has bilateral mandibular lingual tori present.  PERIODONTAL:  The patient has chronic periodontal disease with plaque and calculus accumulation/accretions, gingival recession, and bone loss.  DENTITION:  There are multiple missing teeth and root segments which  are noted. I would need a panoramic x-ray and dental x-rays to identify the exact tooth numbers present/missing.  DENTAL CARIES:  There are multiple dental caries which are noted.  ENDODONTIC:  There is no history of acute pulpitis symptoms. I would need dental x-rays/panoramic x-ray to rule out periapical pathology.  CROWN AND BRIDGE:  There is no history of crown or bridge restorations.  PROSTHODONTIC:  The patient denies the presence of dentures.  OCCLUSION:  The patient has a poor occlusal scheme secondary to multiple missing teeth, multiple root segments, multiple dental caries, lack  of replacement of the missing teeth with dental prostheses, as well as supraeruption and drifting of the unopposed teeth into the edentulous areas.  RADIOGRAPHIC INTERPRETATION:  (I suggest a panoramic x-ray as soon as the patient can stand to allow the x-ray to be taken in radiology.)  ASSESSMENT:  1. Fever of unknown origin with a need to rule out dental etiology. The     patient currently has no history of toothaches, swellings, or abscess     formation. There is no evidence of intraoral purulence. There is no     palpable lymphadenopathy. Although dental etiology is still possible,     acute dental origin is doubted at this time. I will obtain a panoramic     x-ray to evaluate the dentition more closely and rule out periapical     pathology.  2. Plaque and calculus accumulation/accretions.  3. Rampant dental caries.  4. Chronic periodontal disease with bone loss.  5. Gingival recession.  6. Multiple missing teeth.  7. Multiple root segments.  8. Supraeruption and drifting of the unopposed teeth into the edentulous     areas.  9. Lack of replacement of the missing teeth with dental prostheses. 10. Poor occlusal scheme secondary to #6 through 9 as above. 11. Presence of bilateral mandibular lingual tori.  PLAN/RECOMMENDATIONS:  1. We will obtain a panoramic x-ray as soon as the patient can stand and     allow the panoramics to be taken in the department of radiology. They     will need to rule out impacted teeth, retained root tips, and dental     pathology.  2. Discussion of findings with Viviann Spare C. Dorris Fetch, M.D. concerning the     possibility of dental etiology for the fever of unknown origin. We will     then discuss the ability/stability of the patient to undergo dental     procedures as indicated.  3. Continue the chlorhexidine rinses as prescribed.  4. Discussion with the patient for the need for oral hygiene and with a     subsequent need for dental treatment in the  future.   5. We will discuss the risks, benefits, and complications of various     treatment options with the patient as indicated. DD:  06/09/99 TD:  06/09/99 Job: [id number]

## 2010-07-08 NOTE — Op Note (Signed)
[place]. [facility]  Patient:    WAYLYN, TENBRINK                     MRN: [id number] Proc. Date: 06/02/60 Adm. Date:  [id number] Attending:  Darlin Priestly CC:         Madaline Savage, M.D.                           Operative Report  PREOPERATIVE DIAGNOSIS:  Three vessel coronary disease, status post myocardial infarction, and retained undeployed intracoronary stent.  POSTOPERATIVE DIAGNOSIS:  Three vessel coronary disease, status post myocardial  infarction, and retained undeployed intracoronary stent.  PROCEDURE:  Median sternotomy, extracorporeal circulation, removal of stent from proximal right coronary artery, coronary artery bypass grafting x 6 (LIMA to LAD, sequential saphenous vein graft to first and second diagonal, sequential saphenous vein graft to OM1 and posterolateral branch of RCA, saphenous vein graft to posterior descending).  SURGEON:  Salvatore Decent. Dorris Fetch, M.D.  ASSISTANT:  Areta Haber, P.A.  ANESTHESIA:  General.  FINDINGS:  Undeployed stent in the proximal right coronary, heavily diseased and calcified proximal right coronary artery, heavily calcified proximal left-sided  coronary artery as well.  All coronaries diffusely diseased, but good targets inside anastomoses.  CLINICAL NOTE:  The patient is a 60 year old diabetic gentleman who presented with an acute evolving inferior myocardial infarction.  He was taken emergently to the cath lab by Madaline Savage, M.D.  PTCA was performed of the right coronary artery.  There was some immediate restenosis and an attempt was made to place a  stent.  This was a very tortuous vessel and the stent became dislodged from the  catheter and was retained in the proximal right coronary between two very tortuous portions of the vessel.  It was felt that the risks of perforation or other injury to the coronary from trying to extract the stent percutaneously out weighed  the  benefits in attempting that.  The patient was treated with heparin and intravenous 2B3A inhibitors to prevent thrombosis around the stent.  The patient was referred for coronary artery bypass grafting because of severe three vessel coronary disease.  The indications, risks, benefits, and alternatives of the procedures were discussed in detail with the patient.  He understood the urgent nature of the procedure secondary to the retained stent, and an attempt would be made to extract the stent if possible, but no guarantee that that would be feasible.  The patient understood the risks and agreed to proceed.  DESCRIPTION OF PROCEDURE:  The patient was brought to the preoperative holding rea on June 03, 1999.  Lines were placed to monitor arterial, central venous, and pulmonary arterial pressure.  EKG leads were placed for continuous telemetry. he patient was taken to the operating room, anesthetized, and intubated.  A Foley catheter was placed.  Intravenous antibiotics were administered.  The chest, abdomen, and legs were prepped and draped in the usual fashion.  A median sternotomy was performed.  Simultaneously, an incision was made in the  medial aspect of the right leg, and the greater saphenous vein was harvested from the ankle to the mid thigh.  The left internal mammary artery was harvested in he standard fashion.  It was a good quality target vessel with many intrapleural adhesions.  The patient was fully heparinized prior to dividing the distal end f the mammary artery.  There was good flow  through the cut end of the vessel. The mammary was placed in a papaverine soaked sponge and placed into the left pleural space.  The pericardium was opened.  The ascending aorta was palpated.  There was no palpable atherosclerotic disease and it was a normal size.  The aorta was cannulated via concentric 2-0 Ethibond non-pledgeted pursestring sutures.  A dual stage venous  cannula was placed via pursestring suture in the right atrial appendage.  Cardiopulmonary bypass was instituted, and the patient was cooled to 32 degrees Celsius.  The coronary arteries were inspected and the anastomotic sites were chosen.  The conduits were inspected and cut to length.  A foam pad was placed in the pericardium to protect the left phrenic nerve.  A temperature probe was placed in the myocardial septum, and a cardioplegia cannula was placed in the ascending aorta.  The aorta was cross-clamped.  The left ventricle was emptied via the aortic root vent.  Cardiac arrest then was achieved with a combination of cold antegrade blood cardioplegia and topical ice saline.  650 cc of cardioplegia was administered. The myocardial septal temperature was 10 degrees Celsius.  The right coronary artery was identified proximally in the area between the two  very tortuous portions of the artery was identified.  An arteriotomy was made at the distal extent of this.  This was a heavily calcified vessel in this region.  The stent was easily seen and was easily extracted from the artery at this location.   The arteriotomy then was closed with a running 6-0 Prolene suture.  After giving additional cardioplegia, a vein graft was placed end-to-side to the posterior descending branch of the right coronary artery.  The posterior descending was a 1.5 mm vessel.  It was good quality at the site of the anastomosis. There was a tight stenosis just proximal to the site of the anastomosis.  The vein graft was relatively large caliber, but of good quality.  The anastomosis was performed with a running 7-0 Prolene suture.  There was good flow through the anastomosis. Cardioplegia was administered down the vein graft and there was good hemostasis of the anastomosis.  Next, a reverse saphenous vein graft was placed sequentially to the anterolateral first obtuse marginal branch.  This was a  large vessel which bifurcated the majority of the anterolateral wall.  There was calcification proximally.  It was partially intramyocardial at the site where the anastomosis was performed.  A  side-to-side anastomosis was performed to this vessel off a large branch of the  vein graft using a running 7-0 Prolene suture.  There was good flow through this vein graft at the completion of this anastomosis.  The distal end of the same segment of saphenous vein then was anastomosed end-to-side from the posterolateral branch of the right coronary.  The posterolateral branch was a dominant vessel along the posterolateral wall.  The vein graft was of fair quality at its distal end.  The anastomosis was performed with a running 7-0 Prolene suture.  It was probed proximally and distally prior to tying the suture to ensure patency.  At the completion of this anastomosis, cardioplegia was administered down the vein grafts, and could be seen in both target vessels, and there was good hemostasis at both  anastomoses.  There was some bleeding from the myocardium at the site of the OM1 anastomosis.  Next, a reverse saphenous vein graft was placed sequentially to the first and second diagonal branches.  These arose as a common  trunk and it bifurcated. There was a tight stenosis at the bifurcation and into the first diagonal branch which ran parallel to the LAD.  The second diagonal branch ran parallel for a short distance and then branched laterally.  _____ anastomoses were performed with a running 7-0 Prolene suture, and a side-to-side anastomosis was performed to the  first diagonal and end-to-side to the second diagonal.  Again, there was good flow through the vein graft and there was good hemostasis of both anastomoses when cardioplegia was infused.  Next, the left internal mammary artery was brought through a window in the pericardium anterior to the left phrenic nerve.  The distal end  was spatulated n preparation for the anastomosis.  It was anastomosed end-to-side to the distal AD using a running 8-0 Prolene suture.  The mammary was 2 mm good quality conduit ith excellent flow.  The LAD was a 1.8 mm good quality vessel at the site of the anastomosis.  However, there was some palpable disease proximally and distally o the anastomosis.  A 1.5 mm probe did pass in both directions.  At the completion of the mammary to LAD anastomosis, the bulldog clamp was removed from the mammary artery, and immediate and rapid septal rewarming was noted.  Lidocaine was administered.  The anastomosis was inspected for hemostasis which was excellent. The mammary and pedicle was tacked to the epicardial surface of the heart with  6-0 Prolene sutures.  The aortic cross clamp was removed.  Total cross clamp time was 81 minutes.  A single defibrillation at 20 joules was required.  The partial occlusion clamp was placed on the ascending aorta.  The cardioplegia cannula was removed.  The vein  grafts were cut to length and the proximal anastomoses were performed with 4.4 m punch aortotomies with running 6-0 Prolene sutures.  At the completion of the final proximal anastomosis and before tying the suture, the patient was placed in Trendelenburg position.  As the partial clamp was removed, air was allowed to vent prior to tying the suture.  Air was then aspirated from each of the vein grafts. The bulldog clamps were removed and flow was restored.  All proximal and distal  anastomoses were inspected for hemostasis.  Epicardial pacing wires were placed on the right ventricle and right atrium.  After the patient had been rewarmed to  37 degrees Celsius, he was weaned from cardiopulmonary bypass in sinus rhythm with no inotropic support.  Total bypass time was 140 minutes.  A test dose of Protamine was administered and was well-tolerated.  The atrial and aortic cannulae were  removed.  There was good hemostasis at both cannulation sites. The remainder of Protamine was administered without incident.  The chest was irrigated with 1 L of warm normal saline containing 1 g of vancomycin.  The pericardium was closed with interrupted 3-0 silk sutures.  A left pleural and two mediastinal chest tubes were placed through separate subcostal incisions.  The sternum was closed with interrupted stainless steel wires.  The pectoralis fascia was closed with running #1 Vicryl suture.  Both the chest and the legs, the skin and subcutaneous tissue was closed with a running 2-0 Vicryl suture, and the skin was closed with a 3-0 Vicryl subcuticular suture.  All sponge, needle, and instrument counts were correct at the end of the procedure, and there were no intraoperative complications. DD:  06/03/99 TD:  06/03/99 Job: [id number]

## 2010-07-08 NOTE — Discharge Summary (Signed)
[place]. [facility]  Patient:    Anthony Ferguson, Anthony Ferguson                     MRN: [id number] Adm. Date:  [id number] Disc. Date: [id number] Attending:  Phifer, Harriett Sine [place] Dictator:   Dara Hoyer, M.D. CC:         Alvester Morin, M.D.             Fransisco Hertz, M.D.             Salvatore Decent Dorris Fetch, M.D.             Cindra Eves, D.D.S.                           Discharge Summary  DATE OF BIRTH: 1951/02/15  DISCHARGE DIAGNOSES:  1. Acute inferior myocardial infarction, status post coronary artery bypass     graft x 6.  2. Fever of unknown origin.  3. Human immunodeficiency virus positive.  4. Hepatitis C positive.  5. Diabetes mellitus.  6. Hypertension.  7. Chronic alcohol abuse.  8. Lower extremity swelling.  9. Lumbar degenerative joint disease. 10. Poor dental hygiene.  DISCHARGE MEDICATIONS:  1. Combivir 1 tablet p.o. q.d.  2. Septa DS 1 tablet p.o. q.d.  3. Nevirapine 1 tablet p.o. q.d. x 2 weeks and then 1 tablet p.o. b.i.d.  4. Metoprolol 1 tablet p.o. b.i.d.  5. Lasix 1 tablet p.o. q.d.  6. Glucotrol XL 1 tablet p.o. q.a.m.  7. K-Dur 20 mEq 1 tablet p.o. q.d.  8. Chlorhexidine mouthwash, rinse with 1 tablespoonful (15 ml) b.i.d.  HOSPITAL FOLLOW-UPS:  1. Follow up at [facility] with Dr. Lina Sayre on Jul 01, 1999.  2. Dental Medicine Clinic follow-up (will call patient for appointment time).  3. Cardiovascular and thoracic surgeons follow-up on Wednesday, Jul 06, 1999.  4. The patient was instructed to go to the [facility]     the day after discharge and have his prescriptions filled at a cost of $3     per prescription.  5. The patient is instructed to call the [facility] or     alcohol and drug services for substance abuse counseling appointment.  6. The patient is encouraged to call Alcoholics Anonymous and attend     meetings.  7. The patient is instructed to hold  anti-retroviral therapy until social     work contacts the patient regarding funding for these prescriptions.  An     application for ADAP has already been completed by social work.  8. Hold K-Dur until his follow-up appointment with Dr. Lina Sayre, who will     recheck the potassium on follow-up and reinstate K-Dur therapy as-needed.  OPERATION/PROCEDURE:  1. On June 02, 1999 the patient underwent percutaneous transluminal coronary     angioplasty with attempted stent placement in the right coronary artery.  2. On June 03, 1999 the patient underwent coronary artery bypass graft x 6.  3. On June 09, 1999 the patient underwent chest CT which was negative for     infiltrate and showing substernal air and bibasilar atelectasis consistent     with status post sternotomy.  4. On June 15, 1999 the patient underwent CT of the abdomen and pelvis,     negative for lymphadenopathy.  CONSULTATIONS:  1. Salvatore Decent Dorris Fetch, M.D. (cardiothoracic surgery).  2. Fransisco Hertz,  M.D. (infectious disease).  3. Cindra Eves, D.D.S. (dentistry).  HISTORY OF PRESENT ILLNESS: This patient is a 60 year old black male with no prior history of coronary artery disease who developed chest pain the day of admission at 9 a.m.  The patient took aspirin at home at that time. Associated symptoms included diaphoresis, with no nausea and vomiting or shortness of breath.  He described his left anterior chest as aching and coming and going in intervals.  The patient presented to [facility] Department with ST elevations in 2, 3, and AVF, with reciprocal changes in anterior leads.  The patient was given IV heparin and nitroglycerin and integrelin, and then transported to [facility] for acute catheterization.  IV morphine 2 mg was also given at that time.  PAST MEDICAL HISTORY:  1. Diabetes.  2. Hypertension.  3. Tobacco abuse.  ALLERGIES: No known drug allergies.  FAMILY  HISTORY: Also significant for coronary artery disease.  SOCIAL HISTORY: Significant for tobacco use and chronic alcohol use for over 40 years.  MEDICATIONS: The patient reported being on Vioxx and some hypertension and diabetes medications which were unknown to him at the time.  PHYSICAL EXAMINATION:  VITAL SIGNS: Vital signs at [facility] included a blood pressure of 177/114, pulse 111, respiratory rate 20, and temperature 98.1 degrees.  GENERAL: Alert, slender black male, with pale dry skin.  NECK: No carotid bruits or JVD appreciated.  HEART: Without gallops or rubs.  CHEST: No rales were heard on lung examination.  EXTREMITIES: Without edema, with 3+ distal pulses.  ABDOMEN: Soft, with no organ enlargement.  LABORATORY DATA: Initial cardiac enzymes were measured at a CK of 166, CK-MB of 10.1, and troponin of 0.22.  Enzymes peaked on the evening of admission at CK of 1770, CK-MB of 144, and troponin I at 21.47.  Admission WBC was 3.5.  HOSPITAL COURSE: #1 - ACUTE INFERIOR MYOCARDIAL INFARCTION: The patient was taken for emergent catheterization on the day of admission and seen with a 100% proximal RCA occlusion and approximately 70% occlusion of the left anterior descending artery.  Attempt to place a stent in the RCA was unsuccessful secondary to tortuous anatomy and the undeployed stent was left in place.  The patient was taken to coronary artery bypass graft surgery on June 03, 1999 and had six bypasses placed.  The patients postoperative course from cardiologic perspective went well.  The patient tolerated rehabilitation well and was ambulating at the time of discharge freely and on a regular basis with no recurrent of chest pain, shortness of breath, or diaphoresis.  The patient was placed on metoprolol 25 mg q.12h.  #2 - FEVER OF UNKNOWN ORIGIN: The patient developed an increase of his  temperature within hours postoperatively on June 04, 1999 to a temperature  of 101 degrees.  The patient was started on vancomycin at that time.  The day after surgery his WBC was noted at 4.4 and suspicion was raised that the etiology may be secondary to the patients poor dental hygiene.  Cleocin was added to his antibiotic regimen.  The patient continued to spike fevers up to 103 degrees and on June 06, 1999 had Cipro and Flagyl added on to his antibiotic regimen.  Sputum cultures were sent on June 07, 1999, which were negative.  Blood cultures drawn on June 05, 1999 also showed no growth. Infectious disease was consulted on June 08, 1999 and a chest CT was obtained to evaluate the substernal wound as well as  ordering repeat blood cultures and an HIV test.  The CT of the chest showed no substernal abscess or infectious process.  The blood cultures grew out one out of two Staphylococcal species which was believed to be a contamination.  The HIV test proved to be positive, both by ELISA and confirmatory western blot.  A hepatitis and pancreatitis panel drawn at that time also showed that the patient was hepatitis C positive.  The chest CT obtained on June 08, 1999 also showed no pneumonia as well as the usual effusions and atelectasis as well as substernal air seen in a post sternotomy patient.  In light of the patients HIV positive status investigation of his continuing fevers was directed toward HIV related diseases.  The patients CD4 count was measured at 40.  Cultures were drawn for acid-fast bacilli and abdominal CT was obtained to evaluation for lymphadenopathy.  No lymphadenopathy was seen on either chest or abdominal CT, therefore lymphoma as etiologic condition for his fever or unknown origin was ruled out.  Suspicion of drug fever also arose, and once the likelihood of bacterial source ruled out Cipro and Flagyl were discontinued on June 11, 1999 and vancomycin was discontinued on June 12, 1999.  From June 13, 1999 until the time of the patients  discharge he remained afebrile.  Laboratories pending at the time of his discharge as part of this work-up include a blood culture drawn on June 15, 1999, a CMV, EIA, cryoglobulins, and Toxoplasma IgG and IgM.  The patient was also assayed for Cryptococcal antigen, which was negative.  #3 - HUMAN IMMUNODEFICIENCY VIRUS POSITIVE: The patient denied any history of intravenous drug use or other risk factors for HIV aside from having multiple sexual partners.  The patient was placed on anti-retroviral therapy and Septra for PCP prophylaxis.  AIDS education was also started with social work as well.  The patients CD4 count was measured at 40, and a viral load drawn was pending at discharge.  The patient is to follow up with Dr. Maurice March in infectious disease clinic as well as continuing to investigate sources of social support for HIV positive patients.  #4 - HEPATITIS C POSITIVE: During the course of his investigation for fever or unknown origin, the patient tested positive for hepatitis C.  The patient was counseled to avoid alcohol intake as this would increase his risk of cirrhosis and hepatocellular carcinoma.  The patient verbalized his understanding of this, and stated that he was open for substance abuse counseling as well as going to Alcoholics Anonymous.  The patient is to follow up with Dr. Maurice March in infectious disease regarding this condition.  #5 - DIABETES: The patient was placed on glipizide 10 mg q.a.m. on admission. The patient was euglycemic throughout the course of his hospitalization and also tested with an A1C on admission of 5.6.  The patient was discharged on the same medication and will follow up as-needed.  #6 - HYPERTENSION: On admission to the hospital the patient was placed on Lasix 40 mg q.d. as well as being started on metoprolol status post CABG.  The patients blood pressure was well controlled throughout the course of his hospitalization, and he was discharged on  the same medications.  #7 - LOWER EXTREMITY SWELLING: The patient was observed on his postoperative course to have developed some lower extremity swelling, with the right significantly larger than the left.  Dopplers were obtained on June 07, 1999 and showed no signs of DVT or superficial thrombosis.  Further  along in the patients postoperative course the swelling resolved without return.  #8 - POOR DENTAL HYGIENE: During the postoperative fever of unknown origin suspicion arose for a dental source of infection, secondary to the patients noticeably poor dental hygiene.  Dr. Cindra Eves was consulted and dental examination revealed multiple missing teeth, chronic periodontal disease, gingival recession, and bone loss.  There was no evidence of intraoral purulence.  An orthodontogram obtained on June 09, 1999 revealed dental caries with questionable periapical abscesses.  The assessment of the patient included assumption that although dental etiology is still possible acute dental origin was doubted as the source of fever of unknown origin. Chlorhexidine rinses were started and the patient was arranged to have follow-up with dentistry as-needed for further dental care.  #9 - LUMBAR DEGENERATIVE JOINT DISEASE: The patient on history reported chronic low back pain which he attributed to a "pinched nerve".  On June 09, 1999 chest CT incidentally showed severe lumbar degenerative joint disease. The patient was encouraged to follow up with primary care for management of this condition.  DISCHARGE LABORATORY DATA: WBC 3.2, hemoglobin 10.6, hematocrit 30.5, MCV 82.6, platelets 370,000.  Erythrocyte sedimentation rate 101.  PT 15.4, PTT 42, INR 1.4.  Sodium 135, potassium 3.7, chloride 102, CO2 28, glucose 99, BUN 11, creatinine 0.8.  Calcium 7.9, total protein 7.1, albumin 2.3, AST 62, ALT 29, ALP 62, total bilirubin 0.6, direct bilirubin 0.2, LDH 162.  Amylase 102. Hemoglobin A1C 5.6.   TSH 1.453.  HIV positive.  Hepatitis C antibody positive. RPR nonreactive.  CD4 equals 40. DD:  06/17/99 TD:  06/19/99 Job: [id number]

## 2010-08-09 ENCOUNTER — Other Ambulatory Visit (INDEPENDENT_AMBULATORY_CARE_PROVIDER_SITE_OTHER): Payer: Medicare HMO | Admitting: *Deleted

## 2010-08-09 DIAGNOSIS — B20 Human immunodeficiency virus [HIV] disease: Secondary | ICD-10-CM

## 2010-08-09 MED ORDER — LAMIVUDINE-ZIDOVUDINE 150-300 MG PO TABS: 1.0000 | ORAL_TABLET | Freq: Two times a day (BID) | ORAL | Status: DC

## 2010-08-09 MED ORDER — NEVIRAPINE 200 MG PO TABS: 200.0000 mg | ORAL_TABLET | Freq: Two times a day (BID) | ORAL | Status: DC

## 2010-08-29 ENCOUNTER — Other Ambulatory Visit: Payer: Self-pay | Admitting: *Deleted

## 2010-08-29 DIAGNOSIS — B20 Human immunodeficiency virus [HIV] disease: Secondary | ICD-10-CM

## 2010-08-29 MED ORDER — NEVIRAPINE 200 MG PO TABS: 200.0000 mg | ORAL_TABLET | Freq: Two times a day (BID) | ORAL | Status: DC

## 2010-08-29 MED ORDER — LAMIVUDINE-ZIDOVUDINE 150-300 MG PO TABS: 1.0000 | ORAL_TABLET | Freq: Two times a day (BID) | ORAL | Status: DC

## 2010-09-01 ENCOUNTER — Other Ambulatory Visit: Payer: Self-pay

## 2010-09-01 ENCOUNTER — Other Ambulatory Visit: Payer: Medicare HMO

## 2010-09-01 DIAGNOSIS — B20 Human immunodeficiency virus [HIV] disease: Secondary | ICD-10-CM

## 2010-09-13 ENCOUNTER — Ambulatory Visit: Payer: Medicare HMO | Admitting: Infectious Diseases

## 2010-09-15 ENCOUNTER — Ambulatory Visit: Payer: Medicare HMO | Admitting: Infectious Diseases

## 2010-09-19 ENCOUNTER — Other Ambulatory Visit (INDEPENDENT_AMBULATORY_CARE_PROVIDER_SITE_OTHER): Payer: Medicare HMO

## 2010-09-19 ENCOUNTER — Other Ambulatory Visit: Payer: Medicare Other | Admitting: Adult Health

## 2010-09-19 DIAGNOSIS — B20 Human immunodeficiency virus [HIV] disease: Secondary | ICD-10-CM

## 2010-09-19 DIAGNOSIS — Z113 Encounter for screening for infections with a predominantly sexual mode of transmission: Secondary | ICD-10-CM

## 2010-09-19 DIAGNOSIS — E119 Type 2 diabetes mellitus without complications: Secondary | ICD-10-CM

## 2010-09-19 DIAGNOSIS — Z79899 Other long term (current) drug therapy: Secondary | ICD-10-CM

## 2010-09-19 LAB — LIPID PANEL
LDL Cholesterol: 70 mg/dL (ref 0–99)
Triglycerides: 146 mg/dL (ref <150)

## 2010-09-19 LAB — RPR

## 2010-09-19 LAB — POCT GLYCOSYLATED HEMOGLOBIN (HGB A1C): Hemoglobin A1C: 6

## 2010-09-20 LAB — COMPLETE METABOLIC PANEL WITH GFR
ALT: 38 U/L (ref 0–53)
AST: 45 U/L — ABNORMAL HIGH (ref 0–37)
Albumin: 3.9 g/dL (ref 3.5–5.2)
Alkaline Phosphatase: 60 U/L (ref 39–117)
GFR, Est Non African American: >60 mL/min (ref >60)
Glucose, Bld: 196 mg/dL — ABNORMAL HIGH (ref 70–99)
Potassium: 3.7 mEq/L (ref 3.5–5.3)
Sodium: 139 mEq/L (ref 135–145)
Total Bilirubin: 0.8 mg/dL (ref 0.3–1.2)
Total Protein: 7.1 g/dL (ref 6.0–8.3)

## 2010-09-20 LAB — T-HELPER CELL (CD4) - (RCID CLINIC ONLY)
CD4 % Helper T Cell: 38 % (ref 33–55)
CD4 T Cell Abs: 520 uL (ref 400–2700)

## 2010-09-20 LAB — CBC WITH DIFFERENTIAL/PLATELET
Basophils Absolute: 0 10*3/uL (ref 0.0–0.1)
Basophils Relative: 0 % (ref 0–1)
Eosinophils Absolute: 0.2 10*3/uL (ref 0.0–0.7)
Hemoglobin: 13.3 g/dL (ref 13.0–17.0)
MCH: 41.4 pg — ABNORMAL HIGH (ref 26.0–34.0)
MCHC: 36.7 g/dL — ABNORMAL HIGH (ref 30.0–36.0)
Neutro Abs: 1.7 10*3/uL (ref 1.7–7.7)
Neutrophils Relative %: 48 % (ref 43–77)
Platelets: 129 10*3/uL — ABNORMAL LOW (ref 150–400)
RDW: 14.2 % (ref 11.5–15.5)

## 2010-09-23 ENCOUNTER — Other Ambulatory Visit: Payer: Self-pay | Admitting: *Deleted

## 2010-09-23 MED ORDER — GLIPIZIDE ER 10 MG PO TB24: 10.0000 mg | ORAL_TABLET | Freq: Every day | ORAL | Status: DC

## 2010-10-03 ENCOUNTER — Ambulatory Visit: Payer: Medicare HMO | Admitting: Adult Health

## 2010-11-14 LAB — T-HELPER CELL (CD4) - (RCID CLINIC ONLY): CD4 T Cell Abs: 330 — ABNORMAL LOW

## 2010-11-16 ENCOUNTER — Other Ambulatory Visit: Payer: Self-pay | Admitting: *Deleted

## 2010-11-16 DIAGNOSIS — I10 Essential (primary) hypertension: Secondary | ICD-10-CM

## 2010-11-16 MED ORDER — METOPROLOL TARTRATE 50 MG PO TABS: 50.0000 mg | ORAL_TABLET | Freq: Two times a day (BID) | ORAL | Status: DC

## 2010-11-22 ENCOUNTER — Ambulatory Visit: Payer: Medicare HMO

## 2010-11-23 LAB — T-HELPER CELL (CD4) - (RCID CLINIC ONLY): CD4 T Cell Abs: 390 — ABNORMAL LOW

## 2010-11-24 ENCOUNTER — Ambulatory Visit: Payer: Medicare HMO

## 2010-11-24 ENCOUNTER — Emergency Department (HOSPITAL_COMMUNITY): Payer: Medicare HMO

## 2010-11-24 ENCOUNTER — Encounter: Payer: Self-pay | Admitting: Emergency Medicine

## 2010-11-24 ENCOUNTER — Emergency Department (HOSPITAL_COMMUNITY)
Admission: EM | Admit: 2010-11-24 | Discharge: 2010-11-24 | Disposition: A | Discharge status: Home / Self Care | Payer: Medicare HMO | Attending: Emergency Medicine | Admitting: Emergency Medicine

## 2010-11-24 DIAGNOSIS — Z79899 Other long term (current) drug therapy: Secondary | ICD-10-CM | POA: Insufficient documentation

## 2010-11-24 DIAGNOSIS — S6990XA Unspecified injury of unspecified wrist, hand and finger(s), initial encounter: Secondary | ICD-10-CM

## 2010-11-24 DIAGNOSIS — W2209XA Striking against other stationary object, initial encounter: Secondary | ICD-10-CM | POA: Insufficient documentation

## 2010-11-24 DIAGNOSIS — R609 Edema, unspecified: Secondary | ICD-10-CM | POA: Insufficient documentation

## 2010-11-24 DIAGNOSIS — J3489 Other specified disorders of nose and nasal sinuses: Secondary | ICD-10-CM | POA: Insufficient documentation

## 2010-11-24 DIAGNOSIS — Z21 Asymptomatic human immunodeficiency virus [HIV] infection status: Secondary | ICD-10-CM | POA: Insufficient documentation

## 2010-11-24 DIAGNOSIS — S6980XA Other specified injuries of unspecified wrist, hand and finger(s), initial encounter: Secondary | ICD-10-CM | POA: Insufficient documentation

## 2010-11-24 HISTORY — DX: Human immunodeficiency virus (HIV) disease: B20

## 2010-11-24 HISTORY — DX: Essential (primary) hypertension: I10

## 2010-11-24 HISTORY — DX: Asymptomatic human immunodeficiency virus (hiv) infection status: Z21

## 2010-11-24 HISTORY — DX: Pure hypercholesterolemia, unspecified: E78.00

## 2010-11-24 MED ORDER — IBUPROFEN 600 MG PO TABS: 600.0000 mg | ORAL_TABLET | Freq: Four times a day (QID) | ORAL | Status: AC | PRN

## 2010-11-24 MED ORDER — OXYCODONE-ACETAMINOPHEN 5-325 MG PO TABS: 1.0000 | ORAL_TABLET | ORAL | Status: AC | PRN

## 2010-11-24 MED ORDER — OXYCODONE-ACETAMINOPHEN 5-325 MG PO TABS: 1.0000 | ORAL_TABLET | Freq: Four times a day (QID) | ORAL | Status: AC | PRN

## 2010-11-24 MED ORDER — CEPHALEXIN 500 MG PO CAPS: 500.0000 mg | ORAL_CAPSULE | Freq: Four times a day (QID) | ORAL | Status: AC

## 2010-11-24 MED ORDER — OXYCODONE-ACETAMINOPHEN 5-325 MG PO TABS
1.0000 | ORAL_TABLET | ORAL | Status: AC
  Administered 2010-11-24: 1 via ORAL
  Filled 2010-11-24: qty 1

## 2010-11-24 NOTE — ED Notes (Signed)
Pt c/o L thumb pain and swelling since last night. Caught in car door x 1 m onth ago and problems since.

## 2010-11-24 NOTE — ED Provider Notes (Signed)
History     CSN: [id number] Arrival date & time: 11/24/2010  1:54 PM  Chief Complaint  Patient presents with  . Hand Pain    (Consider location/radiation/quality/duration/timing/severity/associated sxs/prior treatment) HPI Comments: Jammed his left thumb between furniture and wall when carrying things yesterday.  Now with pain and swelling in left thumb between PIP joint and thumbnail. Denies numbness or fever.  Has not take anything for pain.   Patient is a 60 y.o. male presenting with hand pain. The history is provided by the patient.  Hand Pain This is a new problem. The current episode started yesterday. The problem has been gradually worsening. Associated symptoms include congestion and joint swelling. Pertinent negatives include no abdominal pain, chest pain, chills, nausea, numbness, rash, vomiting or weakness. Exacerbated by: Moving finger hurts. He has tried nothing for the symptoms.    Past Medical History  Diagnosis Date  . Myocardial infarct   . Diabetes mellitus   . Hypertension   . Hypercholesteremia   . HIV (human immunodeficiency virus infection)     Past Surgical History  Procedure Date  . Cardiac surgery     History reviewed. No pertinent family history.  History  Substance Use Topics  . Smoking status: Never Smoker   . Smokeless tobacco: Not on file  . Alcohol Use: No     used to      Review of Systems  Constitutional: Negative for chills, activity change and appetite change.  HENT: Positive for congestion.   Respiratory: Negative for chest tightness and shortness of breath.   Cardiovascular: Negative for chest pain and palpitations.  Gastrointestinal: Negative for nausea, vomiting and abdominal pain.  Musculoskeletal: Positive for joint swelling.  Skin: Negative for rash.  Neurological: Negative for weakness and numbness.    Allergies  Review of patient's allergies indicates no known allergies.  Home Medications   Current Outpatient Rx   Name Route Sig Dispense Refill  . ASPIRIN 325 MG PO TABS Oral Take 325 mg by mouth daily.      Marland Kitchen GLIPIZIDE 10 MG PO TB24 Oral Take 1 tablet (10 mg total) by mouth daily. 30 tablet 6  . GLUCOSE BLOOD VI STRP Other 1 each by Other route 3 (three) times daily. Use as instructed     . LAMIVUDINE-ZIDOVUDINE 150-300 MG PO TABS Oral Take 1 tablet by mouth 2 (two) times daily. 60 tablet 6  . LANCETS MISC Does not apply 1 Stick by Does not apply route 3 (three) times daily.      Marland Kitchen METOPROLOL TARTRATE 50 MG PO TABS Oral Take 1 tablet (50 mg total) by mouth 2 (two) times daily. 60 tablet 1    Must make appt before more refills  . NEVIRAPINE 200 MG PO TABS Oral Take 1 tablet (200 mg total) by mouth 2 (two) times daily. 60 tablet 6  . PRAVASTATIN SODIUM 20 MG PO TABS Oral Take 1 tablet (20 mg total) by mouth daily. 30 tablet 5  . SILDENAFIL CITRATE 50 MG PO TABS Oral Take 50 mg by mouth daily as needed.      Marland Kitchen VERAPAMIL HCL 200 MG PO CP24 Oral Take 1 capsule (200 mg total) by mouth at bedtime. 30 capsule 11    BP 149/90  Pulse 73  Temp(Src) 98.2 F (36.8 C) (Oral)  Resp 16  Ht 6\' 3"  (1.905 m)  Wt 186 lb (84.369 kg)  BMI 23.25 kg/m2  Physical Exam  Constitutional: He is oriented to person, place, and time.  No distress.  Cardiovascular: Normal rate, regular rhythm, S1 normal and S2 normal.  Exam reveals gallop and S3.   No murmur heard. Pulmonary/Chest: Effort normal and breath sounds normal. No respiratory distress. He has no wheezes. He has no rales (Coarse breath sounds bilateral bases).  Abdominal: Soft. Bowel sounds are normal. He exhibits no distension. There is no tenderness.  Neurological: He is alert and oriented to person, place, and time. He exhibits normal muscle tone.       Sensation intact in left hand Difficult to assess grip strength due to pain and swelling but ROM seems intact but arm and wrist strength intact   Skin: Skin is dry. He is not diaphoretic.       Erythematous and  swollen over left thumb. No laceration, felon, or other lesion.   Psychiatric: He has a normal mood and affect. His behavior is normal. Judgment and thought content normal.    ED Course  Procedures (including critical care time)  Labs Reviewed - No data to display No results found.   No diagnosis found.   MDM  This is a 61 YO M who had a traumatic injury to his thumb while moving furniture.  X-ray of thumb doesn't show any fracture or other concerning lesion. Will D/C home with oral analgesics and Keflex to cover for possible cellulitis with his being HIV-positive and significant erythema over his thumb.        Lucianne [place] Resident 11/24/10 1517

## 2010-11-24 NOTE — ED Provider Notes (Signed)
Medical screening examination/treatment/procedure(s) were conducted as a shared visit with residen(s) and myself.  I personally evaluated the patient during the encounter  Pt well appearing, no signs of felon or paronychia No bony injury   Joya Gaskins, MD 11/24/10 2220

## 2010-12-26 ENCOUNTER — Emergency Department (HOSPITAL_COMMUNITY)
Admission: EM | Admit: 2010-12-26 | Discharge: 2010-12-26 | Disposition: A | Discharge status: Home / Self Care | Payer: Medicare HMO | Attending: Emergency Medicine | Admitting: Emergency Medicine

## 2010-12-26 ENCOUNTER — Encounter (HOSPITAL_COMMUNITY): Payer: Self-pay

## 2010-12-26 DIAGNOSIS — Z21 Asymptomatic human immunodeficiency virus [HIV] infection status: Secondary | ICD-10-CM | POA: Insufficient documentation

## 2010-12-26 DIAGNOSIS — M545 Low back pain, unspecified: Secondary | ICD-10-CM | POA: Insufficient documentation

## 2010-12-26 DIAGNOSIS — E119 Type 2 diabetes mellitus without complications: Secondary | ICD-10-CM | POA: Insufficient documentation

## 2010-12-26 DIAGNOSIS — I10 Essential (primary) hypertension: Secondary | ICD-10-CM | POA: Insufficient documentation

## 2010-12-26 DIAGNOSIS — I252 Old myocardial infarction: Secondary | ICD-10-CM | POA: Insufficient documentation

## 2010-12-26 DIAGNOSIS — F17200 Nicotine dependence, unspecified, uncomplicated: Secondary | ICD-10-CM | POA: Insufficient documentation

## 2010-12-26 DIAGNOSIS — E7800 Pure hypercholesterolemia, unspecified: Secondary | ICD-10-CM | POA: Insufficient documentation

## 2010-12-26 DIAGNOSIS — Z7982 Long term (current) use of aspirin: Secondary | ICD-10-CM | POA: Insufficient documentation

## 2010-12-26 MED ORDER — IBUPROFEN 800 MG PO TABS: 800.0000 mg | ORAL_TABLET | Freq: Once | ORAL | Status: AC

## 2010-12-26 MED ORDER — HYDROCODONE-ACETAMINOPHEN 5-325 MG PO TABS: 1.0000 | ORAL_TABLET | ORAL | Status: AC | PRN

## 2010-12-26 MED ORDER — HYDROCODONE-ACETAMINOPHEN 5-325 MG PO TABS
1.0000 | ORAL_TABLET | Freq: Once | ORAL | Status: AC
  Administered 2010-12-26: 1 via ORAL
  Filled 2010-12-26: qty 1

## 2010-12-26 MED ORDER — IBUPROFEN 800 MG PO TABS
800.0000 mg | ORAL_TABLET | Freq: Once | ORAL | Status: AC
Start: 2010-12-26 — End: 2010-12-26
  Administered 2010-12-26: 800 mg via ORAL
  Filled 2010-12-26: qty 1

## 2010-12-26 NOTE — ED Notes (Signed)
Pt presents with right sided leg pain. Pt states pain is in posterior thigh and calf. Pt ambulated to triage with steady gate.

## 2010-12-26 NOTE — ED Notes (Signed)
Pt a/ox4. Resp even and unlabored. NAD at this time. D/C instructions and Rx x2 reviewed with pt. Pt verbalized understanding. Pt ambulated to lobby with steady gate. Pt waiting on friend to transport home.
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
## 2010-12-27 NOTE — ED Provider Notes (Signed)
Medical screening examination/treatment/procedure(s) were performed by non-physician practitioner and as supervising physician I was immediately available for consultation/collaboration.   Shelda Jakes, MD 12/27/10 [phone]

## 2011-01-04 ENCOUNTER — Telehealth: Payer: Self-pay

## 2011-01-04 NOTE — Telephone Encounter (Signed)
Received an email from Artis Delay at [facility] that patient has left VM on her phone regarding ADAP - he did not recertify for Summer Recertification and did not show for appt on 11/24/10 to renew ADAP - tried to call him at number Bella Kennedy had - [phone] and got continuous busy signal, then tried home phone and it rang and rang until it disconnected. Also tried mobile phone and left message on VM. Hopefully, that will get through and he will call back.

## 2011-01-17 ENCOUNTER — Ambulatory Visit: Payer: Medicare HMO

## 2011-01-19 ENCOUNTER — Other Ambulatory Visit: Payer: Self-pay | Admitting: *Deleted

## 2011-01-19 ENCOUNTER — Ambulatory Visit: Payer: Medicare HMO

## 2011-01-19 DIAGNOSIS — B20 Human immunodeficiency virus [HIV] disease: Secondary | ICD-10-CM

## 2011-01-19 MED ORDER — LAMIVUDINE-ZIDOVUDINE 150-300 MG PO TABS: 1.0000 | ORAL_TABLET | Freq: Two times a day (BID) | ORAL | Status: DC

## 2011-01-19 MED ORDER — NEVIRAPINE 200 MG PO TABS: 200.0000 mg | ORAL_TABLET | Freq: Two times a day (BID) | ORAL | Status: DC

## 2011-01-20 ENCOUNTER — Other Ambulatory Visit: Payer: Self-pay | Admitting: *Deleted

## 2011-01-20 DIAGNOSIS — I2510 Atherosclerotic heart disease of native coronary artery without angina pectoris: Secondary | ICD-10-CM

## 2011-01-20 DIAGNOSIS — B20 Human immunodeficiency virus [HIV] disease: Secondary | ICD-10-CM

## 2011-01-20 MED ORDER — PRAVASTATIN SODIUM 20 MG PO TABS: 20.0000 mg | ORAL_TABLET | Freq: Every day | ORAL | Status: DC

## 2011-01-23 ENCOUNTER — Other Ambulatory Visit (HOSPITAL_COMMUNITY): Payer: Self-pay | Admitting: Family Medicine

## 2011-01-23 ENCOUNTER — Ambulatory Visit (HOSPITAL_COMMUNITY)
Admission: RE | Admit: 2011-01-23 | Discharge: 2011-01-23 | Disposition: A | Discharge status: Home / Self Care | Payer: Medicare HMO | Source: Ambulatory Visit | Attending: Family Medicine | Admitting: Family Medicine

## 2011-01-23 DIAGNOSIS — M545 Low back pain, unspecified: Secondary | ICD-10-CM | POA: Insufficient documentation

## 2011-01-23 DIAGNOSIS — M5137 Other intervertebral disc degeneration, lumbosacral region: Secondary | ICD-10-CM | POA: Insufficient documentation

## 2011-01-23 DIAGNOSIS — R52 Pain, unspecified: Secondary | ICD-10-CM

## 2011-01-23 DIAGNOSIS — M51379 Other intervertebral disc degeneration, lumbosacral region without mention of lumbar back pain or lower extremity pain: Secondary | ICD-10-CM | POA: Insufficient documentation

## 2011-01-23 DIAGNOSIS — M79609 Pain in unspecified limb: Secondary | ICD-10-CM | POA: Insufficient documentation

## 2011-02-07 ENCOUNTER — Other Ambulatory Visit (HOSPITAL_COMMUNITY): Payer: Self-pay | Admitting: Family Medicine

## 2011-02-07 DIAGNOSIS — M545 Low back pain: Secondary | ICD-10-CM

## 2011-02-10 ENCOUNTER — Ambulatory Visit (HOSPITAL_COMMUNITY): Payer: Medicare HMO

## 2011-02-23 ENCOUNTER — Other Ambulatory Visit: Payer: Self-pay | Admitting: *Deleted

## 2011-02-23 NOTE — Telephone Encounter (Signed)
rec'd fax asking for viagra refill. Has not been seen in over a year. Denied. Faxed back with a note that he needed an appt first

## 2011-02-27 ENCOUNTER — Other Ambulatory Visit: Payer: Self-pay | Admitting: *Deleted

## 2011-02-27 DIAGNOSIS — I2510 Atherosclerotic heart disease of native coronary artery without angina pectoris: Secondary | ICD-10-CM

## 2011-02-27 MED ORDER — PRAVASTATIN SODIUM 20 MG PO TABS: 20.0000 mg | ORAL_TABLET | Freq: Every day | ORAL | Status: DC

## 2011-02-27 NOTE — Telephone Encounter (Signed)
Message left on pt's phone to call the Center for make and keep f/u appt.

## 2011-02-28 ENCOUNTER — Ambulatory Visit (HOSPITAL_COMMUNITY): Payer: Medicare HMO

## 2011-02-28 ENCOUNTER — Telehealth: Payer: Self-pay | Admitting: Licensed Clinical Social Worker

## 2011-02-28 NOTE — Telephone Encounter (Signed)
Patient called left message that he needed an appointment, I returned his call and he did not answer. I left him a message to return our call if he still needed an appointment,

## 2011-03-10 ENCOUNTER — Ambulatory Visit (HOSPITAL_COMMUNITY): Payer: Medicare HMO

## 2011-03-13 ENCOUNTER — Ambulatory Visit (HOSPITAL_COMMUNITY)
Admission: RE | Admit: 2011-03-13 | Discharge: 2011-03-13 | Disposition: A | Discharge status: Home / Self Care | Payer: Medicare HMO | Source: Ambulatory Visit | Attending: Family Medicine | Admitting: Family Medicine

## 2011-03-13 DIAGNOSIS — M5126 Other intervertebral disc displacement, lumbar region: Secondary | ICD-10-CM | POA: Insufficient documentation

## 2011-03-13 DIAGNOSIS — M545 Low back pain, unspecified: Secondary | ICD-10-CM | POA: Insufficient documentation

## 2011-03-13 DIAGNOSIS — M51379 Other intervertebral disc degeneration, lumbosacral region without mention of lumbar back pain or lower extremity pain: Secondary | ICD-10-CM | POA: Insufficient documentation

## 2011-03-13 DIAGNOSIS — M79609 Pain in unspecified limb: Secondary | ICD-10-CM | POA: Insufficient documentation

## 2011-03-13 DIAGNOSIS — M5137 Other intervertebral disc degeneration, lumbosacral region: Secondary | ICD-10-CM | POA: Insufficient documentation

## 2011-04-04 ENCOUNTER — Other Ambulatory Visit: Payer: Self-pay | Admitting: Licensed Clinical Social Worker

## 2011-04-04 DIAGNOSIS — I2510 Atherosclerotic heart disease of native coronary artery without angina pectoris: Secondary | ICD-10-CM

## 2011-04-04 MED ORDER — PRAVASTATIN SODIUM 20 MG PO TABS: 20.0000 mg | ORAL_TABLET | Freq: Every day | ORAL | Status: DC

## 2011-04-06 ENCOUNTER — Other Ambulatory Visit: Payer: Self-pay | Admitting: *Deleted

## 2011-04-06 NOTE — Telephone Encounter (Signed)
Pt requesting new glucose meter and diabetic supply company, [employer].  Faxed back completed request to company after speaking to patient.

## 2011-04-12 ENCOUNTER — Other Ambulatory Visit: Payer: Self-pay | Admitting: Infectious Diseases

## 2011-04-12 DIAGNOSIS — E119 Type 2 diabetes mellitus without complications: Secondary | ICD-10-CM

## 2011-04-13 ENCOUNTER — Ambulatory Visit: Payer: Medicare HMO

## 2011-04-13 ENCOUNTER — Other Ambulatory Visit: Payer: Medicare HMO

## 2011-04-18 ENCOUNTER — Other Ambulatory Visit: Payer: Medicare Other

## 2011-04-18 DIAGNOSIS — E119 Type 2 diabetes mellitus without complications: Secondary | ICD-10-CM

## 2011-04-18 DIAGNOSIS — B20 Human immunodeficiency virus [HIV] disease: Secondary | ICD-10-CM

## 2011-04-18 DIAGNOSIS — Z113 Encounter for screening for infections with a predominantly sexual mode of transmission: Secondary | ICD-10-CM

## 2011-04-19 LAB — CBC WITH DIFFERENTIAL/PLATELET
Eosinophils Absolute: 0.1 10*3/uL (ref 0.0–0.7)
Eosinophils Relative: 3 % (ref 0–5)
Hemoglobin: 13.7 g/dL (ref 13.0–17.0)
Lymphs Abs: 1.4 10*3/uL (ref 0.7–4.0)
MCH: 39.1 pg — ABNORMAL HIGH (ref 26.0–34.0)
MCV: 113.1 fL — ABNORMAL HIGH (ref 78.0–100.0)
Monocytes Absolute: 0.3 10*3/uL (ref 0.1–1.0)
Monocytes Relative: 10 % (ref 3–12)
RBC: 3.5 MIL/uL — ABNORMAL LOW (ref 4.22–5.81)

## 2011-04-19 LAB — COMPLETE METABOLIC PANEL WITH GFR
CO2: 28 mEq/L (ref 19–32)
Creat: 0.8 mg/dL (ref 0.50–1.35)
GFR, Est African American: >89 mL/min
GFR, Est Non African American: >89 mL/min
Glucose, Bld: 120 mg/dL — ABNORMAL HIGH (ref 70–99)
Total Bilirubin: 0.6 mg/dL (ref 0.3–1.2)
Total Protein: 7.3 g/dL (ref 6.0–8.3)

## 2011-04-19 LAB — GC/CHLAMYDIA PROBE AMP, URINE: Chlamydia, Swab/Urine, PCR: NEGATIVE

## 2011-04-19 LAB — HEMOGLOBIN A1C: Hgb A1c MFr Bld: 6.2 % — ABNORMAL HIGH (ref <5.7)

## 2011-04-20 LAB — HIV-1 RNA QUANT-NO REFLEX-BLD: HIV-1 RNA Quant, Log: <1.3 {Log} (ref <1.30)

## 2011-04-27 ENCOUNTER — Ambulatory Visit: Payer: Medicare HMO | Admitting: Infectious Diseases

## 2011-04-27 ENCOUNTER — Ambulatory Visit: Payer: Medicare HMO

## 2011-05-10 ENCOUNTER — Ambulatory Visit: Payer: Medicare HMO

## 2011-05-23 ENCOUNTER — Other Ambulatory Visit: Payer: Self-pay | Admitting: Licensed Clinical Social Worker

## 2011-05-23 DIAGNOSIS — I2510 Atherosclerotic heart disease of native coronary artery without angina pectoris: Secondary | ICD-10-CM

## 2011-05-23 MED ORDER — PRAVASTATIN SODIUM 20 MG PO TABS: 20.0000 mg | ORAL_TABLET | Freq: Every day | ORAL | Status: DC

## 2011-05-26 ENCOUNTER — Other Ambulatory Visit: Payer: Self-pay | Admitting: Family Medicine

## 2011-05-26 DIAGNOSIS — M545 Low back pain: Secondary | ICD-10-CM

## 2011-06-20 ENCOUNTER — Other Ambulatory Visit: Payer: Self-pay | Admitting: Licensed Clinical Social Worker

## 2011-07-03 ENCOUNTER — Other Ambulatory Visit: Payer: Self-pay | Admitting: Infectious Diseases

## 2011-08-02 IMAGING — CR DG CHEST 1V PORT
1 series · 1 of 1 positions shown · non-contrast
Comparison: Portable exam 5854 hours without priors for comparison.

CLINICAL DATA: Focal appearance, history smoking, hypertension,
diabetes, HIV, coronary artery disease post CABG

PORTABLE CHEST - 1 VIEW

[view not recorded]
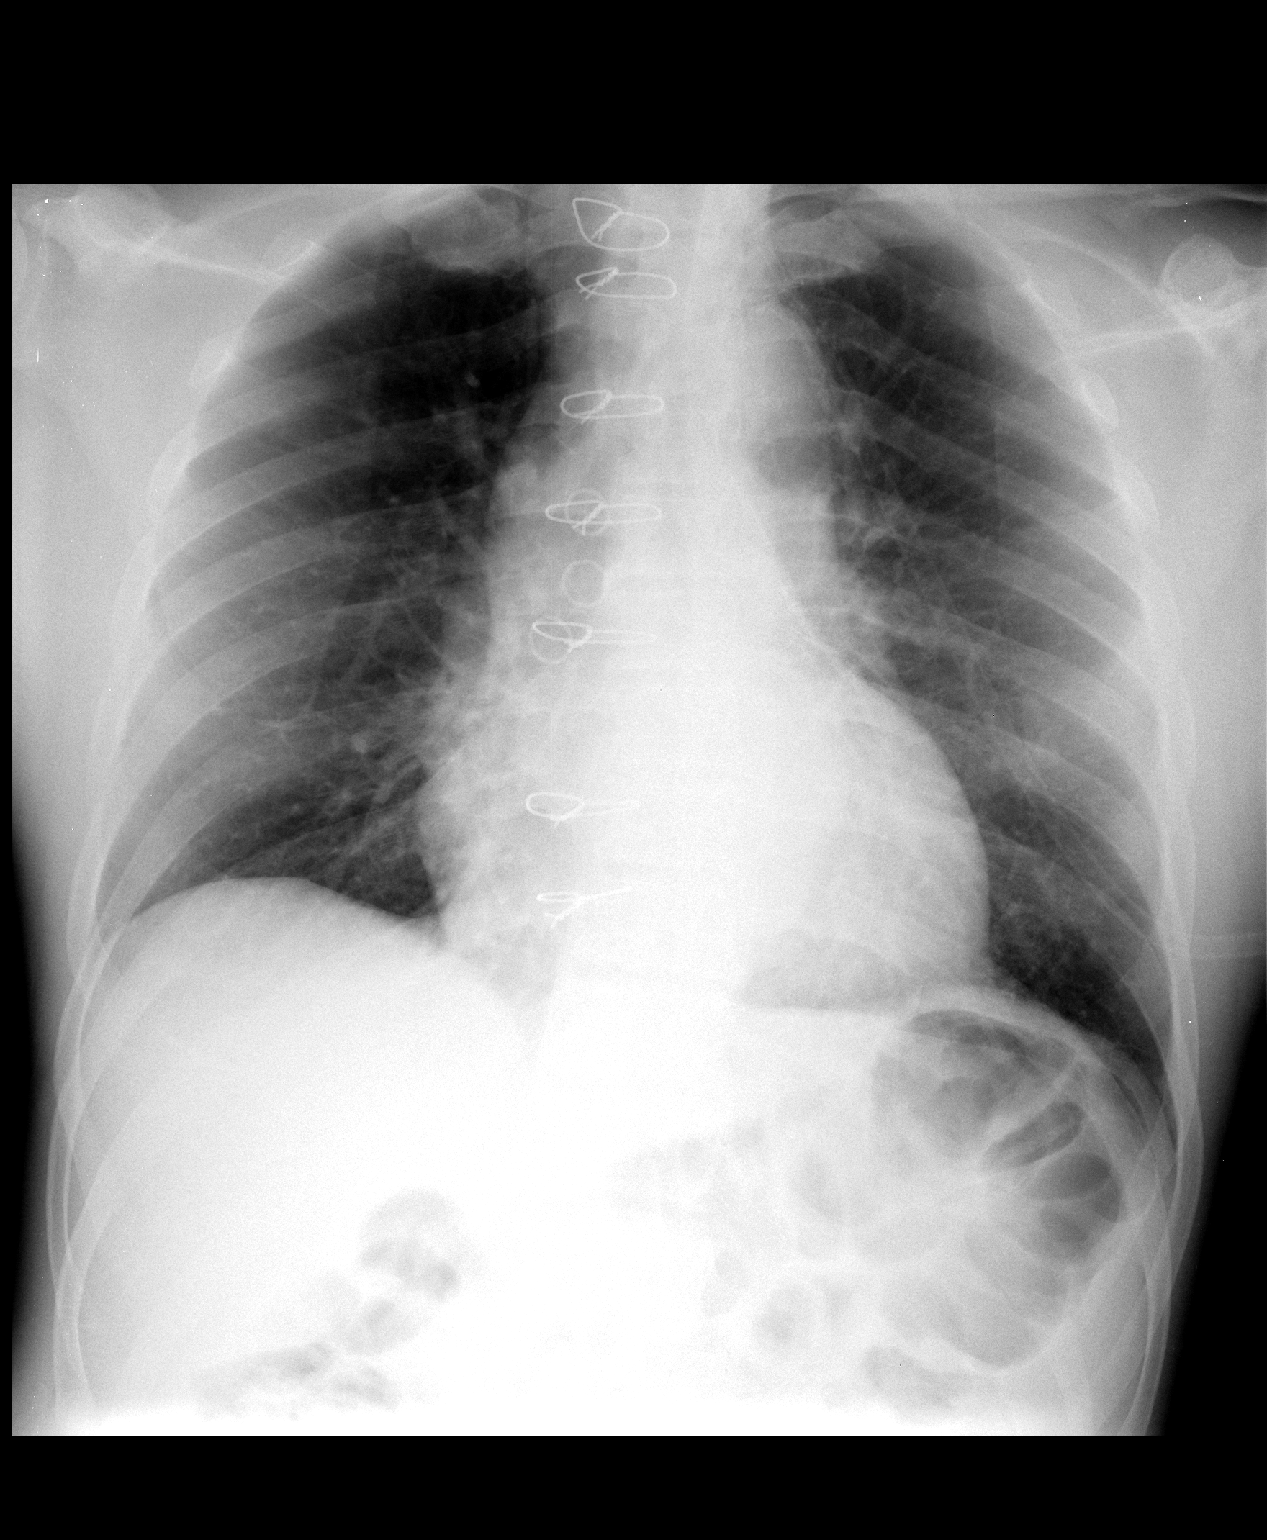

[1 of 1 positions shown; findings below may reference images not displayed]

FINDINGS: Borderline enlargement of cardiac silhouette post CABG.
Tortuous aorta with atherosclerotic calcification.
Pulmonary vascularity normal.
Lungs clear.
An oblong density projects over the mid left lung, adjacent to O2
tubing, question artifact, though this could potentially be related
to the inferior left scapula, the anterior left fifth rib, or a
vague pulmonary nodule.
IMPRESSION: Post CABG.
No acute infiltrate.
Questionable nodular density at the left mid lung, uncertain if
represents pulmonary nodule, artifact, or related to osseous
structures.
Follow-up upright PA and lateral chest radiograph recommended for
assessment.

## 2011-08-03 IMAGING — CR DG CHEST 2V
2 series · 2 of 2 positions shown · non-contrast
Comparison: 04/23/2010

CLINICAL DATA: Medical clearance for an detox, abnormal chest x-ray

CHEST - 2 VIEW

[view not recorded (1 of 2)]
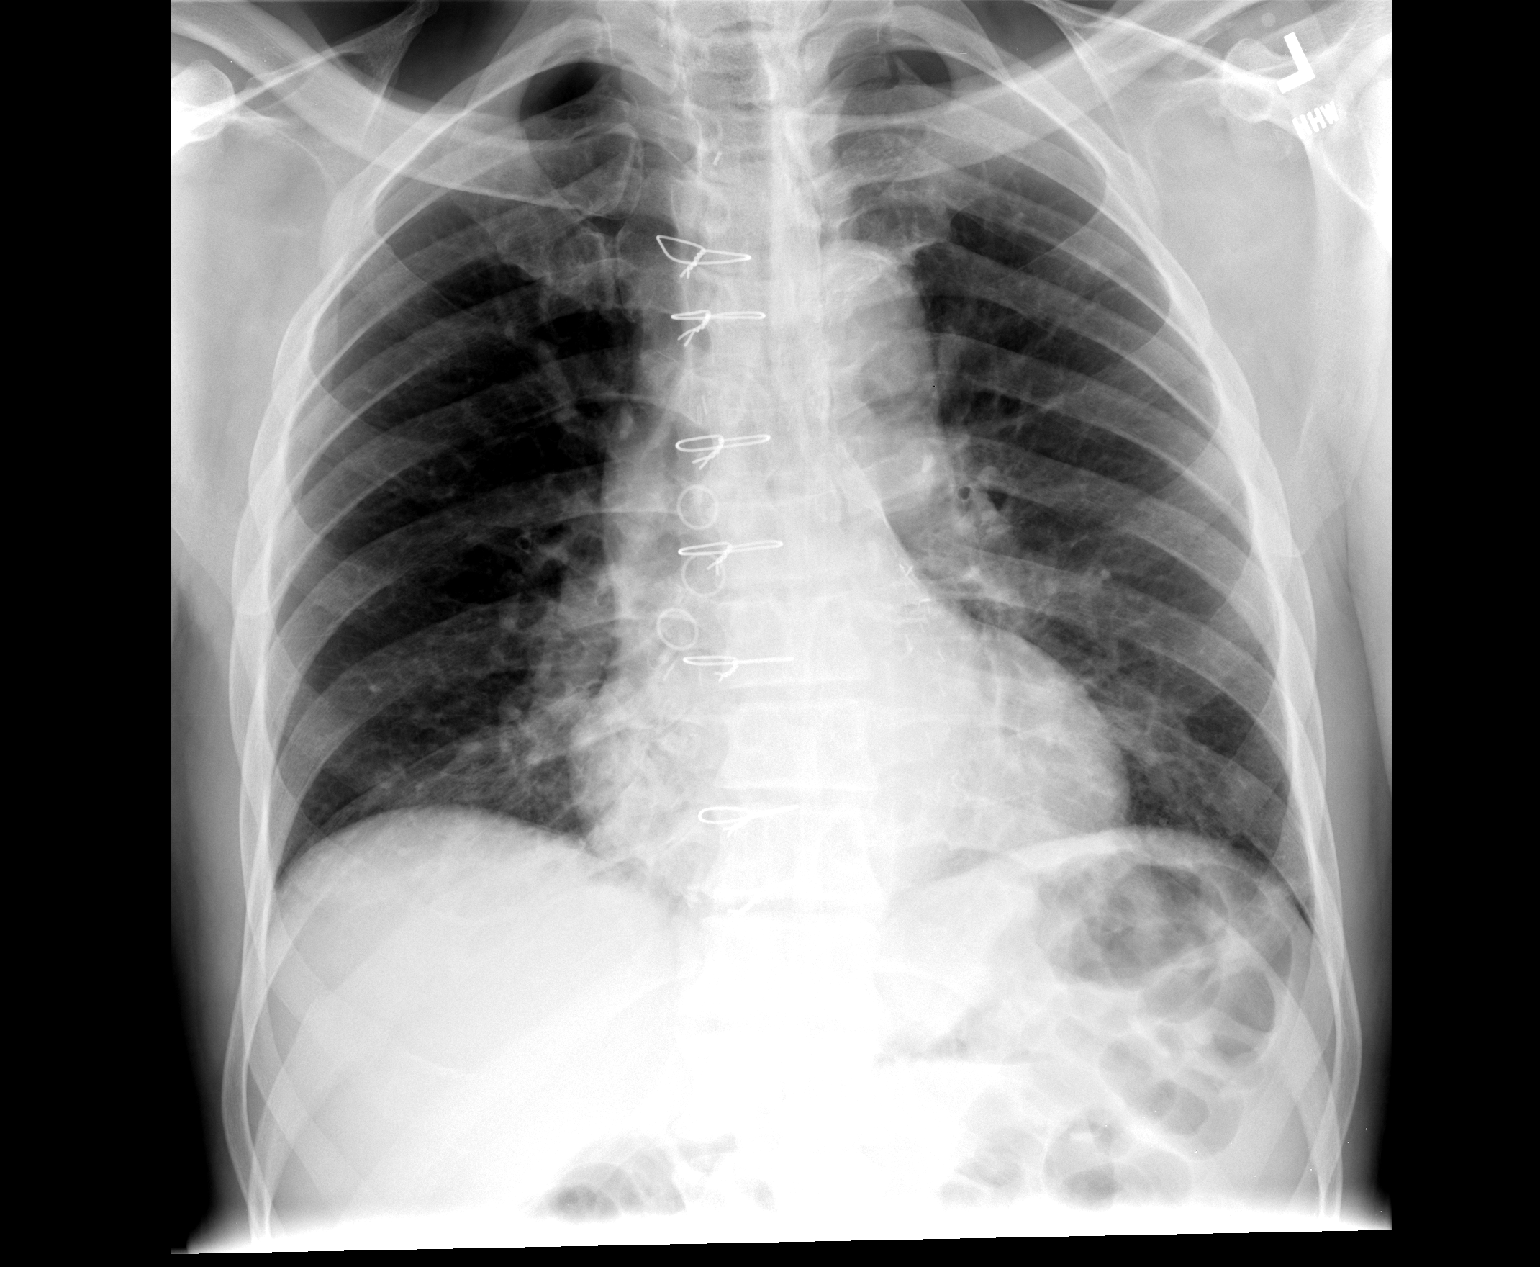

[view not recorded (2 of 2)]
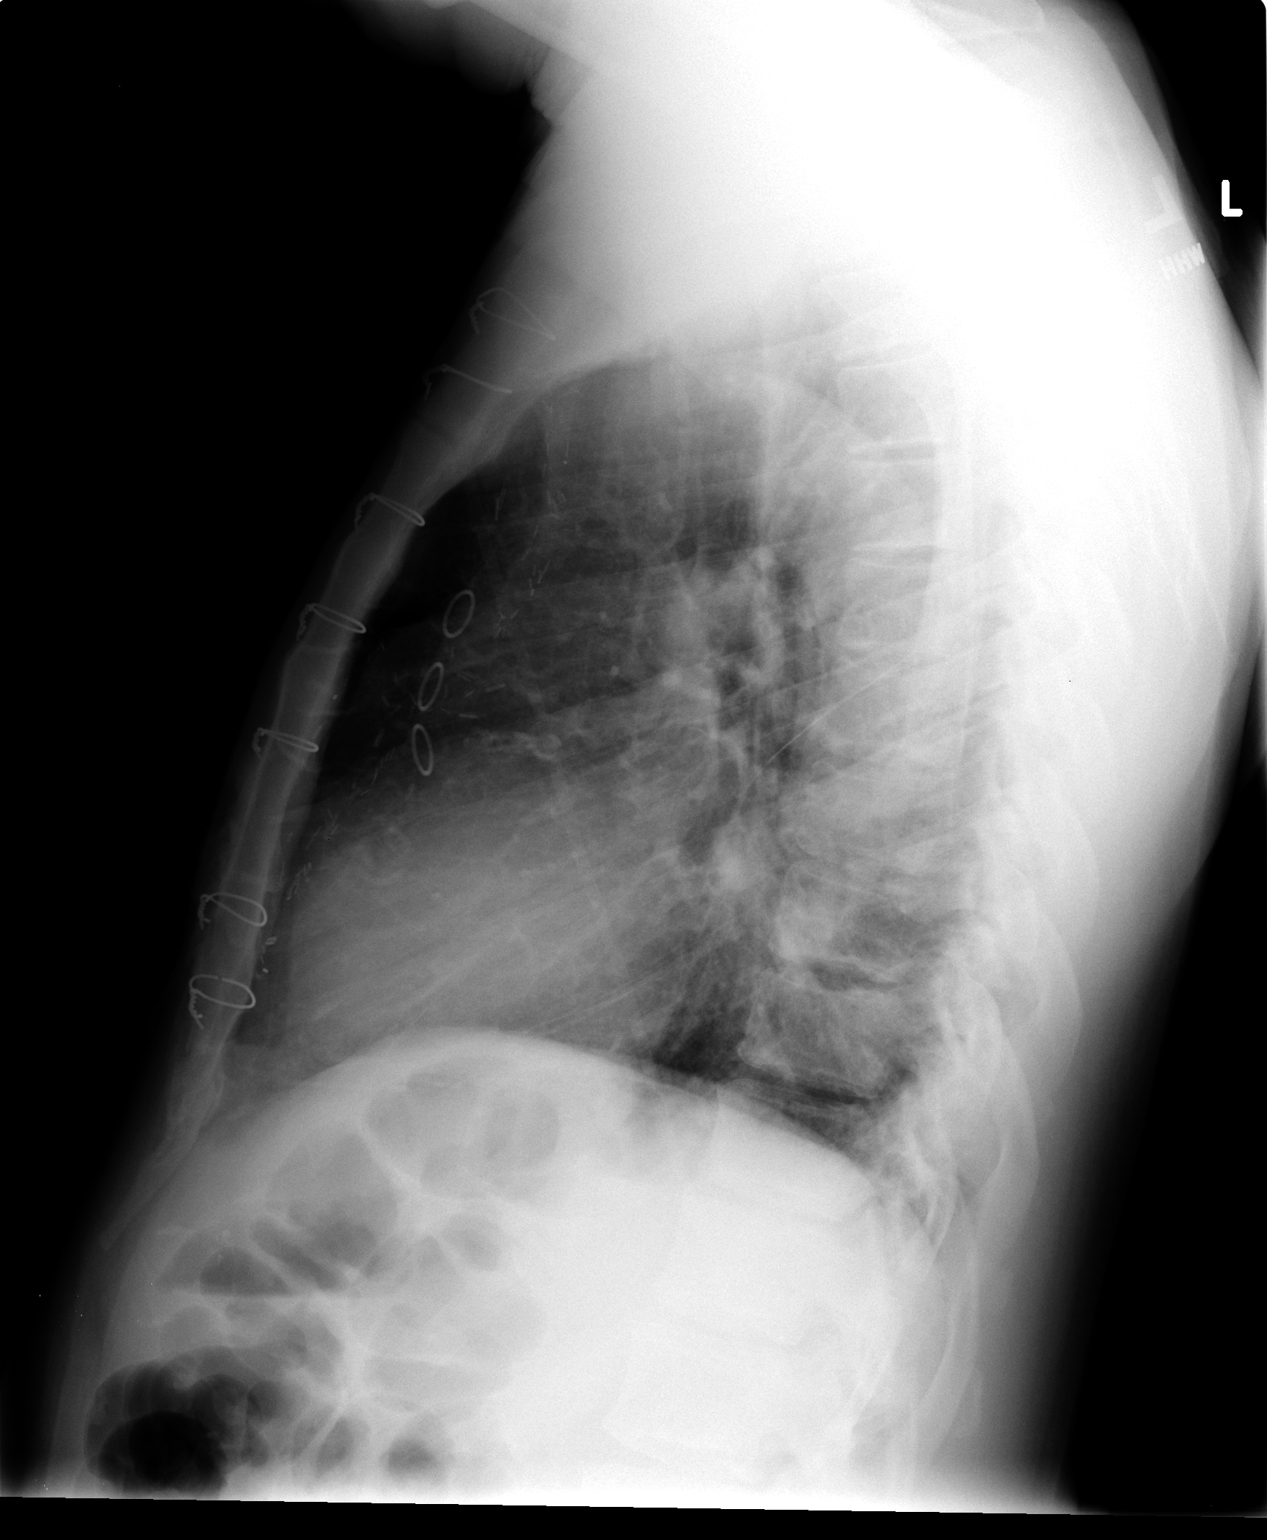

[2 of 2 positions shown; findings below may reference images not displayed]

FINDINGS: Mild enlargement of cardiac silhouette post CABG.
Atherosclerotic calcification aorta.
Pulmonary vascularity normal.
Bronchitic changes.
No infiltrate or effusion.
Nodular density seen on previous exam is no longer identified
compatible with artifact.
No pulmonary nodule is identified.
IMPRESSION: Minimal enlargement of cardiac silhouette post CABG.
Bronchitic changes.
No evidence of pulmonary nodule.

## 2011-08-10 ENCOUNTER — Ambulatory Visit: Payer: Medicare HMO | Admitting: Infectious Diseases

## 2011-09-15 ENCOUNTER — Ambulatory Visit (INDEPENDENT_AMBULATORY_CARE_PROVIDER_SITE_OTHER): Payer: Medicare HMO | Admitting: Infectious Diseases

## 2011-09-15 ENCOUNTER — Encounter: Payer: Self-pay | Admitting: Infectious Diseases

## 2011-09-15 ENCOUNTER — Ambulatory Visit: Payer: Medicare HMO

## 2011-09-15 VITALS — BP 166/92 | HR 59 | Temp 97.8°F | Ht 75.0 in | Wt 177.0 lb

## 2011-09-15 DIAGNOSIS — B20 Human immunodeficiency virus [HIV] disease: Secondary | ICD-10-CM

## 2011-09-15 NOTE — Progress Notes (Signed)
Patient ID: Anthony Ferguson, male   DOB: 04/20/1950, 61 y.o.   MRN: [id number] Anthony Ferguson is well and continues on his ARVs and antiHTN regimen without any fevers, weight loss or local pains. He did have low back pain and his PCP obtained MRI which reportedly showed some disc protrusions and he was treated conservatively with resolution of pain. He continues to smoke about 6 cigs/day and counseled to try to quit. He says he is not likely to quit. BP 166/92  Pulse 59  Temp 97.8 F (36.6 C) (Oral)  Ht 6\' 3"  (1.905 m)  Wt 177 lb (80.287 kg)  BMI 22.12 kg/m2 Anthony Ferguson looks well and in no distress. No adeneopathy and abdomen is soft and benign. No murmurs and clear lungs to auscultation. Impression/plan   HIV controled and he is on unusal regimen in 2013 of Truvada and nevirapine. He says this has worked for >10 years and doesn't want to change. Will f/u in 4 months. Lina Sayre

## 2011-10-27 ENCOUNTER — Ambulatory Visit: Payer: Medicare HMO | Admitting: Infectious Diseases

## 2011-11-03 ENCOUNTER — Other Ambulatory Visit: Payer: Self-pay | Admitting: *Deleted

## 2011-11-03 DIAGNOSIS — I2510 Atherosclerotic heart disease of native coronary artery without angina pectoris: Secondary | ICD-10-CM

## 2011-11-03 MED ORDER — PRAVASTATIN SODIUM 20 MG PO TABS: 20.0000 mg | ORAL_TABLET | Freq: Every day | ORAL | Status: DC

## 2011-12-22 ENCOUNTER — Other Ambulatory Visit: Payer: Self-pay | Admitting: *Deleted

## 2011-12-22 DIAGNOSIS — E119 Type 2 diabetes mellitus without complications: Secondary | ICD-10-CM

## 2011-12-22 MED ORDER — GLIPIZIDE ER 10 MG PO TB24: 10.0000 mg | ORAL_TABLET | Freq: Every day | ORAL | Status: DC

## 2012-01-04 ENCOUNTER — Other Ambulatory Visit: Payer: Self-pay | Admitting: *Deleted

## 2012-01-04 ENCOUNTER — Other Ambulatory Visit (INDEPENDENT_AMBULATORY_CARE_PROVIDER_SITE_OTHER): Payer: Medicare HMO

## 2012-01-04 DIAGNOSIS — B20 Human immunodeficiency virus [HIV] disease: Secondary | ICD-10-CM

## 2012-01-04 DIAGNOSIS — I10 Essential (primary) hypertension: Secondary | ICD-10-CM

## 2012-01-04 DIAGNOSIS — Z79899 Other long term (current) drug therapy: Secondary | ICD-10-CM

## 2012-01-04 LAB — COMPREHENSIVE METABOLIC PANEL
ALT: 30 U/L (ref 0–53)
AST: 37 U/L (ref 0–37)
Alkaline Phosphatase: 64 U/L (ref 39–117)
Sodium: 141 mEq/L (ref 135–145)
Total Bilirubin: 0.9 mg/dL (ref 0.3–1.2)
Total Protein: 6.4 g/dL (ref 6.0–8.3)

## 2012-01-04 LAB — CBC WITH DIFFERENTIAL/PLATELET
Eosinophils Absolute: 0.1 10*3/uL (ref 0.0–0.7)
Eosinophils Relative: 2 % (ref 0–5)
Lymphs Abs: 1.2 10*3/uL (ref 0.7–4.0)
MCH: 40.3 pg — ABNORMAL HIGH (ref 26.0–34.0)
MCV: 110.4 fL — ABNORMAL HIGH (ref 78.0–100.0)
Platelets: 105 10*3/uL — ABNORMAL LOW (ref 150–400)
RBC: 3.18 MIL/uL — ABNORMAL LOW (ref 4.22–5.81)
RDW: 13.7 % (ref 11.5–15.5)

## 2012-01-04 LAB — LIPID PANEL
Cholesterol: 175 mg/dL (ref 0–200)
Triglycerides: 57 mg/dL (ref <150)
VLDL: 11 mg/dL (ref 0–40)

## 2012-01-04 MED ORDER — LAMIVUDINE-ZIDOVUDINE 150-300 MG PO TABS: 1.0000 | ORAL_TABLET | Freq: Two times a day (BID) | ORAL | Status: DC

## 2012-01-04 MED ORDER — METOPROLOL TARTRATE 50 MG PO TABS: 50.0000 mg | ORAL_TABLET | Freq: Two times a day (BID) | ORAL | Status: DC

## 2012-01-04 MED ORDER — NEVIRAPINE 200 MG PO TABS: 200.0000 mg | ORAL_TABLET | Freq: Two times a day (BID) | ORAL | Status: DC

## 2012-01-04 MED ORDER — HYDROCHLOROTHIAZIDE 25 MG PO TABS: 25.0000 mg | ORAL_TABLET | Freq: Every day | ORAL | Status: DC

## 2012-01-19 ENCOUNTER — Ambulatory Visit: Payer: Medicare HMO | Admitting: Infectious Diseases

## 2012-01-26 ENCOUNTER — Ambulatory Visit: Payer: Medicare HMO | Admitting: Infectious Diseases

## 2012-03-15 ENCOUNTER — Ambulatory Visit: Payer: Medicare HMO | Admitting: Infectious Diseases

## 2012-04-18 ENCOUNTER — Other Ambulatory Visit: Payer: Self-pay | Admitting: Internal Medicine

## 2012-04-18 ENCOUNTER — Other Ambulatory Visit: Payer: Self-pay | Admitting: Infectious Diseases

## 2012-04-18 ENCOUNTER — Other Ambulatory Visit (INDEPENDENT_AMBULATORY_CARE_PROVIDER_SITE_OTHER): Payer: Medicare HMO

## 2012-04-18 DIAGNOSIS — B20 Human immunodeficiency virus [HIV] disease: Secondary | ICD-10-CM

## 2012-04-18 DIAGNOSIS — Z113 Encounter for screening for infections with a predominantly sexual mode of transmission: Secondary | ICD-10-CM

## 2012-04-18 LAB — CBC WITH DIFFERENTIAL/PLATELET
Basophils Absolute: 0 10*3/uL (ref 0.0–0.1)
Basophils Relative: 0 % (ref 0–1)
Eosinophils Absolute: 0.1 10*3/uL (ref 0.0–0.7)
Eosinophils Relative: 2 % (ref 0–5)
HCT: 35.5 % — ABNORMAL LOW (ref 39.0–52.0)
Hemoglobin: 13 g/dL (ref 13.0–17.0)
Lymphocytes Relative: 39 % (ref 12–46)
Lymphs Abs: 1.3 10*3/uL (ref 0.7–4.0)
MCH: 38.9 pg — ABNORMAL HIGH (ref 26.0–34.0)
MCHC: 36.6 g/dL — ABNORMAL HIGH (ref 30.0–36.0)
MCV: 106.3 fL — ABNORMAL HIGH (ref 78.0–100.0)
Monocytes Absolute: 0.3 10*3/uL (ref 0.1–1.0)
Monocytes Relative: 9 % (ref 3–12)
Neutro Abs: 1.6 10*3/uL — ABNORMAL LOW (ref 1.7–7.7)
Neutrophils Relative %: 50 % (ref 43–77)
Platelets: 134 10*3/uL — ABNORMAL LOW (ref 150–400)
RBC: 3.34 MIL/uL — ABNORMAL LOW (ref 4.22–5.81)
RDW: 15.1 % (ref 11.5–15.5)
WBC: 3.2 10*3/uL — ABNORMAL LOW (ref 4.0–10.5)

## 2012-04-18 LAB — COMPREHENSIVE METABOLIC PANEL
ALT: 32 U/L (ref 0–53)
AST: 38 U/L — ABNORMAL HIGH (ref 0–37)
Albumin: 3.7 g/dL (ref 3.5–5.2)
BUN: 12 mg/dL (ref 6–23)
CO2: 27 mEq/L (ref 19–32)
Calcium: 9.1 mg/dL (ref 8.4–10.5)
Chloride: 106 mEq/L (ref 96–112)
Potassium: 3.8 mEq/L (ref 3.5–5.3)

## 2012-04-19 LAB — T-HELPER CELL (CD4) - (RCID CLINIC ONLY)
CD4 % Helper T Cell: 45 % (ref 33–55)
CD4 T Cell Abs: 530 uL (ref 400–2700)

## 2012-04-22 LAB — HIV-1 RNA QUANT-NO REFLEX-BLD: HIV 1 RNA Quant: <20 copies/mL (ref <20)

## 2012-05-03 ENCOUNTER — Ambulatory Visit: Payer: Medicare HMO | Admitting: Infectious Diseases

## 2012-05-03 ENCOUNTER — Ambulatory Visit: Payer: Medicare HMO

## 2012-05-03 ENCOUNTER — Encounter: Payer: Self-pay | Admitting: Infectious Diseases

## 2012-05-03 VITALS — BP 151/80 | HR 65 | Temp 97.9°F | Wt 174.0 lb

## 2012-05-03 DIAGNOSIS — B20 Human immunodeficiency virus [HIV] disease: Secondary | ICD-10-CM

## 2012-05-03 NOTE — Progress Notes (Unsigned)
Patient ID: Anthony Ferguson, male   DOB: 26-Dec-1950, 62 y.o.   MRN: [id number] HIV f/u   Anthony Ferguson is doing well and has no complaints and specifically has no cough fevers or  Weight loss. He takes his meds for HIV, HTN, and DMII and has acceptable lab monitoring. There is some confusion probably caused

## 2012-05-03 NOTE — Patient Instructions (Addendum)
Keep up the good work

## 2012-07-12 ENCOUNTER — Other Ambulatory Visit: Payer: Self-pay | Admitting: Infectious Diseases

## 2012-07-25 ENCOUNTER — Other Ambulatory Visit: Payer: Medicare HMO

## 2012-08-09 ENCOUNTER — Ambulatory Visit: Payer: Medicare HMO | Admitting: Infectious Diseases

## 2012-08-19 ENCOUNTER — Other Ambulatory Visit: Payer: Self-pay | Admitting: *Deleted

## 2012-08-19 DIAGNOSIS — B20 Human immunodeficiency virus [HIV] disease: Secondary | ICD-10-CM

## 2012-08-19 DIAGNOSIS — I2510 Atherosclerotic heart disease of native coronary artery without angina pectoris: Secondary | ICD-10-CM

## 2012-08-19 DIAGNOSIS — I10 Essential (primary) hypertension: Secondary | ICD-10-CM

## 2012-08-19 MED ORDER — METOPROLOL TARTRATE 50 MG PO TABS: 50.0000 mg | ORAL_TABLET | Freq: Two times a day (BID) | ORAL | Status: DC

## 2012-08-19 MED ORDER — PRAVASTATIN SODIUM 20 MG PO TABS: ORAL_TABLET | ORAL | Status: DC

## 2012-08-19 MED ORDER — LAMIVUDINE-ZIDOVUDINE 150-300 MG PO TABS: ORAL_TABLET | ORAL | Status: DC

## 2012-08-19 MED ORDER — NEVIRAPINE 200 MG PO TABS: 200.0000 mg | ORAL_TABLET | Freq: Two times a day (BID) | ORAL | Status: DC

## 2012-08-21 ENCOUNTER — Other Ambulatory Visit: Payer: Self-pay | Admitting: *Deleted

## 2012-08-21 DIAGNOSIS — I2510 Atherosclerotic heart disease of native coronary artery without angina pectoris: Secondary | ICD-10-CM

## 2012-08-21 MED ORDER — PRAVASTATIN SODIUM 20 MG PO TABS: ORAL_TABLET | ORAL | Status: DC

## 2012-08-22 ENCOUNTER — Ambulatory Visit: Payer: Medicare HMO | Admitting: Internal Medicine

## 2012-08-28 ENCOUNTER — Other Ambulatory Visit: Payer: Self-pay | Admitting: Licensed Clinical Social Worker

## 2012-08-28 DIAGNOSIS — B20 Human immunodeficiency virus [HIV] disease: Secondary | ICD-10-CM

## 2012-08-28 MED ORDER — NEVIRAPINE 200 MG PO TABS: 200.0000 mg | ORAL_TABLET | Freq: Two times a day (BID) | ORAL | Status: DC

## 2012-08-28 MED ORDER — LAMIVUDINE-ZIDOVUDINE 150-300 MG PO TABS: ORAL_TABLET | ORAL | Status: DC

## 2012-09-03 ENCOUNTER — Ambulatory Visit: Payer: Medicare HMO | Admitting: Internal Medicine

## 2012-09-16 ENCOUNTER — Other Ambulatory Visit: Payer: Self-pay | Admitting: *Deleted

## 2012-09-16 ENCOUNTER — Ambulatory Visit: Payer: Medicare HMO | Admitting: Internal Medicine

## 2012-09-16 ENCOUNTER — Ambulatory Visit: Payer: Medicare HMO

## 2012-09-16 DIAGNOSIS — I10 Essential (primary) hypertension: Secondary | ICD-10-CM

## 2012-09-16 DIAGNOSIS — B20 Human immunodeficiency virus [HIV] disease: Secondary | ICD-10-CM

## 2012-09-16 DIAGNOSIS — I2510 Atherosclerotic heart disease of native coronary artery without angina pectoris: Secondary | ICD-10-CM

## 2012-09-16 MED ORDER — NEVIRAPINE 200 MG PO TABS: 200.0000 mg | ORAL_TABLET | Freq: Two times a day (BID) | ORAL | Status: DC

## 2012-09-16 MED ORDER — PRAVASTATIN SODIUM 20 MG PO TABS: ORAL_TABLET | ORAL | Status: DC

## 2012-09-16 MED ORDER — HYDROCHLOROTHIAZIDE 25 MG PO TABS: 25.0000 mg | ORAL_TABLET | Freq: Every day | ORAL | Status: DC

## 2012-09-16 MED ORDER — METOPROLOL TARTRATE 50 MG PO TABS: 50.0000 mg | ORAL_TABLET | Freq: Two times a day (BID) | ORAL | Status: DC

## 2012-09-16 MED ORDER — LAMIVUDINE-ZIDOVUDINE 150-300 MG PO TABS: ORAL_TABLET | ORAL | Status: DC

## 2012-09-17 ENCOUNTER — Telehealth: Payer: Self-pay | Admitting: *Deleted

## 2012-09-17 NOTE — Telephone Encounter (Signed)
Pt has been scheduled at his request for 09/23/12 at 2:30 with Dr. Orvan Falconer.  RN unable to contact patient to confirm this. Andree Coss, RN

## 2012-09-23 ENCOUNTER — Encounter: Payer: Self-pay | Admitting: Internal Medicine

## 2012-09-23 ENCOUNTER — Ambulatory Visit (INDEPENDENT_AMBULATORY_CARE_PROVIDER_SITE_OTHER): Payer: Medicare HMO | Admitting: Internal Medicine

## 2012-09-23 ENCOUNTER — Ambulatory Visit: Payer: Medicare HMO

## 2012-09-23 VITALS — BP 137/86 | HR 66 | Temp 98.6°F | Ht 75.0 in | Wt 174.5 lb

## 2012-09-23 DIAGNOSIS — B20 Human immunodeficiency virus [HIV] disease: Secondary | ICD-10-CM

## 2012-09-23 NOTE — Progress Notes (Signed)
Patient ID: Anthony Ferguson, male   DOB: 1950/08/11, 62 y.o.   MRN: [id number]          [facility]  Patient Active Problem List   Diagnosis Date Noted  . HIV INFECTION 03/03/2006  . HEPATITIS C 03/03/2006  . DIABETES MELLITUS 03/03/2006  . TOBACCO USER 03/03/2006  . CORONARY ARTERY DISEASE 03/03/2006    Patient's Medications  New Prescriptions   No medications on file  Previous Medications   ASPIRIN 325 MG TABLET    Take 325 mg by mouth daily.     GLIPIZIDE (GLUCOTROL XL) 10 MG 24 HR TABLET    Take 10 mg by mouth daily.   HYDROCHLOROTHIAZIDE (HYDRODIURIL) 25 MG TABLET    Take 1 tablet (25 mg total) by mouth daily. For blood pressure.   LAMIVUDINE-ZIDOVUDINE (COMBIVIR) 150-300 MG PER TABLET    TAKE 1 TABLET BY MOUTH TWICE DAILY   METOPROLOL (LOPRESSOR) 50 MG TABLET    Take 1 tablet (50 mg total) by mouth 2 (two) times daily. For Blood Pressure   NEVIRAPINE (VIRAMUNE) 200 MG TABLET    Take 1 tablet (200 mg total) by mouth 2 (two) times daily.   PRAVASTATIN (PRAVACHOL) 20 MG TABLET    TAKE 1 TABLET BY MOUTH DAILY.   VERAPAMIL (VERELAN PM) 200 MG 24 HR CAPSULE    Take 200 mg by mouth daily.    Modified Medications   No medications on file  Discontinued Medications   No medications on file    Subjective: [name] is in for his routine visit. About a year ago is pharmacy switched him to the generic versions of Combivir and Viramune. He's been on this regimen for the past 11 years. He had previously taken Atripla for about one month but switched because it was causing headaches. He's had no problems tolerating his current regimen and does not believe he misses doses. He does not use a pill box but is very much in the habit of taking his medications. He knows the name of all his medications. He is doing well with the exception of some right anterior chest pain that occurred after he was pitching horseshoes and fishing recently. Review of Systems: Pertinent  items are noted in HPI.  Past Medical History  Diagnosis Date  . Myocardial infarct   . Diabetes mellitus   . Hypertension   . Hypercholesteremia   . HIV (human immunodeficiency virus infection)     History  Substance Use Topics  . Smoking status: Current Every Day Smoker -- 0.50 packs/day  . Smokeless tobacco: Not on file     Comment: Trying to slow down  . Alcohol Use: No     Comment: used to    No family history on file.  No Known Allergies  Objective: Temp: 98.6 F (37 C) (08/04 1432) Temp src: Oral (08/04 1432) BP: 137/86 mmHg (08/04 1432) Pulse Rate: 66 (08/04 1432)  General: He is in good spirits and appears healthy He has some tenderness with palpation of the rightpectoralis muscle but no abnormal swelling. He has no palpable adenopathy. He has no pain with range of motion of his right shoulder  Lab Results HIV 1 RNA Quant (copies/mL)  Date Value  04/18/2012 <20   01/04/2012 <20   04/18/2011 <20      CD4 T Cell Abs (cmm)  Date Value  04/18/2012 530   01/04/2012 470   04/18/2011 580      Assessment: His HIV infection  is under excellent control. He is on a rather old to drug, twice daily regimen but prefers to continue it as opposed to switching to a simpler one pill a day regimen.  He has some simple muscle strain in his right pectoralis muscle that should resolve with time.  Plan: 1. Continue generic versions of Combivir and Viramune 2. Followup after lab work in 6 months   Cliffton Asters, MD [facility] [phone] pager   [phone] cell 09/23/2012, 2:53 PM

## 2012-11-12 ENCOUNTER — Other Ambulatory Visit: Payer: Self-pay | Admitting: Internal Medicine

## 2012-11-14 ENCOUNTER — Other Ambulatory Visit: Payer: Self-pay | Admitting: *Deleted

## 2012-11-14 DIAGNOSIS — E785 Hyperlipidemia, unspecified: Secondary | ICD-10-CM

## 2012-11-14 DIAGNOSIS — B20 Human immunodeficiency virus [HIV] disease: Secondary | ICD-10-CM

## 2012-11-14 DIAGNOSIS — I10 Essential (primary) hypertension: Secondary | ICD-10-CM

## 2012-11-14 DIAGNOSIS — E1059 Type 1 diabetes mellitus with other circulatory complications: Secondary | ICD-10-CM

## 2012-11-14 MED ORDER — GLIPIZIDE ER 10 MG PO TB24: 10.0000 mg | ORAL_TABLET | Freq: Every day | ORAL | Status: DC

## 2012-11-14 MED ORDER — LAMIVUDINE-ZIDOVUDINE 150-300 MG PO TABS
ORAL_TABLET | ORAL | Status: DC
Start: 2012-11-14 — End: 2013-05-27

## 2012-11-14 MED ORDER — PRAVASTATIN SODIUM 20 MG PO TABS: ORAL_TABLET | ORAL | Status: DC

## 2012-11-14 MED ORDER — METOPROLOL TARTRATE 50 MG PO TABS: 50.0000 mg | ORAL_TABLET | Freq: Two times a day (BID) | ORAL | Status: DC

## 2012-11-14 MED ORDER — HYDROCHLOROTHIAZIDE 25 MG PO TABS: ORAL_TABLET | ORAL | Status: DC

## 2012-11-14 MED ORDER — NEVIRAPINE 200 MG PO TABS: 200.0000 mg | ORAL_TABLET | Freq: Two times a day (BID) | ORAL | Status: DC

## 2012-11-18 ENCOUNTER — Other Ambulatory Visit: Payer: Self-pay | Admitting: *Deleted

## 2012-11-18 DIAGNOSIS — I10 Essential (primary) hypertension: Secondary | ICD-10-CM

## 2012-11-18 MED ORDER — METOPROLOL TARTRATE 50 MG PO TABS: 50.0000 mg | ORAL_TABLET | Freq: Two times a day (BID) | ORAL | Status: DC

## 2013-02-03 ENCOUNTER — Telehealth: Payer: Self-pay | Admitting: *Deleted

## 2013-02-03 NOTE — Telephone Encounter (Signed)
Notified [facility] that the pt has this filled elsewhere.  Pt needs to contact [facility] at the number provided on his HIV medications to refill glipizide rx at [facility] [place] [facility].  None of the phone numbers provided could contact the pt.

## 2013-04-01 ENCOUNTER — Other Ambulatory Visit: Payer: Medicare HMO

## 2013-04-01 ENCOUNTER — Ambulatory Visit: Payer: Medicare HMO | Admitting: Internal Medicine

## 2013-04-15 ENCOUNTER — Ambulatory Visit: Payer: Medicare HMO | Admitting: Internal Medicine

## 2013-04-23 ENCOUNTER — Ambulatory Visit: Payer: Medicare HMO

## 2013-05-06 ENCOUNTER — Ambulatory Visit: Payer: Medicare HMO

## 2013-05-06 ENCOUNTER — Ambulatory Visit: Payer: Medicare HMO | Admitting: Internal Medicine

## 2013-05-14 ENCOUNTER — Ambulatory Visit: Payer: Medicare HMO

## 2013-05-14 ENCOUNTER — Encounter: Payer: Self-pay | Admitting: *Deleted

## 2013-05-20 ENCOUNTER — Other Ambulatory Visit: Payer: Medicare HMO

## 2013-05-20 DIAGNOSIS — B20 Human immunodeficiency virus [HIV] disease: Secondary | ICD-10-CM

## 2013-05-20 DIAGNOSIS — Z79899 Other long term (current) drug therapy: Secondary | ICD-10-CM

## 2013-05-20 DIAGNOSIS — Z113 Encounter for screening for infections with a predominantly sexual mode of transmission: Secondary | ICD-10-CM

## 2013-05-20 LAB — RPR

## 2013-05-20 LAB — LIPID PANEL
CHOLESTEROL: 141 mg/dL (ref 0–200)
HDL: 76 mg/dL (ref >39)
LDL Cholesterol: 50 mg/dL (ref 0–99)
TRIGLYCERIDES: 77 mg/dL (ref <150)
Total CHOL/HDL Ratio: 1.9 Ratio
VLDL: 15 mg/dL (ref 0–40)

## 2013-05-20 LAB — COMPREHENSIVE METABOLIC PANEL
ALK PHOS: 116 U/L (ref 39–117)
ALT: 23 U/L (ref 0–53)
AST: 40 U/L — ABNORMAL HIGH (ref 0–37)
Albumin: 3.6 g/dL (ref 3.5–5.2)
BILIRUBIN TOTAL: 1 mg/dL (ref 0.2–1.2)
BUN: 19 mg/dL (ref 6–23)
CO2: 25 meq/L (ref 19–32)
Calcium: 8.7 mg/dL (ref 8.4–10.5)
Chloride: 109 mEq/L (ref 96–112)
Creat: 0.82 mg/dL (ref 0.50–1.35)
GLUCOSE: 114 mg/dL — AB (ref 70–99)
Potassium: 3.8 mEq/L (ref 3.5–5.3)
Sodium: 140 mEq/L (ref 135–145)
Total Protein: 6.7 g/dL (ref 6.0–8.3)

## 2013-05-20 LAB — CBC
HCT: 32.4 % — ABNORMAL LOW (ref 39.0–52.0)
HEMOGLOBIN: 11.9 g/dL — AB (ref 13.0–17.0)
MCH: 38.8 pg — AB (ref 26.0–34.0)
MCHC: 36.7 g/dL — ABNORMAL HIGH (ref 30.0–36.0)
MCV: 105.5 fL — ABNORMAL HIGH (ref 78.0–100.0)
Platelets: 72 10*3/uL — ABNORMAL LOW (ref 150–400)
RBC: 3.07 MIL/uL — ABNORMAL LOW (ref 4.22–5.81)
RDW: 15.5 % (ref 11.5–15.5)
WBC: 3.3 10*3/uL — ABNORMAL LOW (ref 4.0–10.5)

## 2013-05-21 LAB — HIV-1 RNA QUANT-NO REFLEX-BLD: HIV 1 RNA Quant: <20 copies/mL (ref <20)

## 2013-05-21 LAB — T-HELPER CELL (CD4) - (RCID CLINIC ONLY)
CD4 % Helper T Cell: 46 % (ref 33–55)
CD4 T CELL ABS: 280 /uL — AB (ref 400–2700)

## 2013-05-27 ENCOUNTER — Other Ambulatory Visit: Payer: Self-pay | Admitting: *Deleted

## 2013-05-27 DIAGNOSIS — B20 Human immunodeficiency virus [HIV] disease: Secondary | ICD-10-CM

## 2013-05-27 MED ORDER — LAMIVUDINE-ZIDOVUDINE 150-300 MG PO TABS: ORAL_TABLET | ORAL | Status: DC

## 2013-07-23 ENCOUNTER — Ambulatory Visit: Payer: Medicare HMO | Admitting: Internal Medicine

## 2013-08-15 ENCOUNTER — Telehealth: Payer: Self-pay | Admitting: *Deleted

## 2013-08-15 NOTE — Telephone Encounter (Signed)
Pt unable to get transportation to his appointment 6/3, ran out of minutes on his phone to let us know.  Would like to reschedule to Tuesday/Thursday afternoon.  Appt given for 6/29 1:45.  Pt knows to call Monday if he cannot keep this appointment. Andree CossHowell, Michelle M, RN

## 2013-08-19 ENCOUNTER — Ambulatory Visit (INDEPENDENT_AMBULATORY_CARE_PROVIDER_SITE_OTHER): Payer: Medicare HMO | Admitting: Internal Medicine

## 2013-08-19 ENCOUNTER — Encounter: Payer: Self-pay | Admitting: Internal Medicine

## 2013-08-19 VITALS — BP 160/91 | HR 67 | Temp 98.0°F | Wt 168.5 lb

## 2013-08-19 DIAGNOSIS — B182 Chronic viral hepatitis C: Secondary | ICD-10-CM

## 2013-08-19 DIAGNOSIS — B20 Human immunodeficiency virus [HIV] disease: Secondary | ICD-10-CM

## 2013-08-19 NOTE — Progress Notes (Signed)
Patient ID: Napoleon FormRaymond J Ferguson, male   DOB: 03/08/1950, [age] y.o.   MRN: [id number]          Patient Active Problem List   Diagnosis Date Noted  . HIV INFECTION 03/03/2006  . HEPATITIS C 03/03/2006  . DIABETES MELLITUS 03/03/2006  . TOBACCO USER 03/03/2006  . CORONARY ARTERY DISEASE 03/03/2006    Patient's Medications  New Prescriptions   No medications on file  Previous Medications   ASPIRIN 325 MG TABLET    Take 325 mg by mouth daily.     GLIPIZIDE (GLUCOTROL XL) 10 MG 24 HR TABLET    Take 1 tablet (10 mg total) by mouth daily.   HYDROCHLOROTHIAZIDE (HYDRODIURIL) 25 MG TABLET    TAKE 1 TABLET BY MOUTH DAILY FOR BLOOD PRESSURE   LAMIVUDINE-ZIDOVUDINE (COMBIVIR) 150-300 MG PER TABLET    TAKE 1 TABLET BY MOUTH TWICE DAILY   METOPROLOL (LOPRESSOR) 50 MG TABLET    Take 1 tablet (50 mg total) by mouth 2 (two) times daily. For Blood Pressure   NEVIRAPINE (VIRAMUNE) 200 MG TABLET    Take 1 tablet (200 mg total) by mouth 2 (two) times daily.   PRAVASTATIN (PRAVACHOL) 20 MG TABLET    TAKE 1 TABLET BY MOUTH EVERY DAY   VERAPAMIL (VERELAN PM) 200 MG 24 HR CAPSULE    Take 200 mg by mouth daily.    Modified Medications   No medications on file  Discontinued Medications   No medications on file    Subjective: Anthony Ferguson is in for his routine visit. He continues to take generic Combivir and Viramune and denies missing any doses. He continues to smoke cigarettes, about 1-1/2 packs per week. He is trying to cut down. He does not have any current plan to quit completely. He is having some recurrent back pain recently. Review of Systems: Pertinent items are noted in HPI.  Past Medical History  Diagnosis Date  . Myocardial infarct   . Diabetes mellitus   . Hypertension   . Hypercholesteremia   . HIV (human immunodeficiency virus infection)     History  Substance Use Topics  . Smoking status: Current Every Day Smoker -- 0.50 packs/day    Types: Cigarettes  . Smokeless tobacco: Not on file   Comment: Trying to slow down  . Alcohol Use: No     Comment: used to    No family history on file.  No Known Allergies  Objective: Temp: 98 F (36.7 C) (06/30 1345) BP: 160/91 mmHg (06/30 1345) Pulse Rate: 67 (06/30 1345) Body mass index is 21.06 kg/(m^2).  General: He is in good spirits Oral: Dentulous, no oropharyngeal lesions Skin: No rash Lungs: Clear Cor: Regular S1 and S2 with a physiologically split S2  Lab Results Lab Results  Component Value Date   WBC 3.3* 05/20/2013   HGB 11.9* 05/20/2013   HCT 32.4* 05/20/2013   MCV 105.5* 05/20/2013   PLT 72* 05/20/2013    Lab Results  Component Value Date   CREATININE 0.82 05/20/2013   BUN 19 05/20/2013   NA 140 05/20/2013   K 3.8 05/20/2013   CL 109 05/20/2013   CO2 25 05/20/2013    Lab Results  Component Value Date   ALT 23 05/20/2013   AST 40* 05/20/2013   ALKPHOS 116 05/20/2013   BILITOT 1.0 05/20/2013    Lab Results  Component Value Date   CHOL 141 05/20/2013   HDL 76 05/20/2013   LDLCALC 50 05/20/2013   TRIG 77  05/20/2013   CHOLHDL 1.9 05/20/2013    Lab Results HIV 1 RNA Quant (copies/mL)  Date Value  05/20/2013 <20   04/18/2012 <20   01/04/2012 <20      CD4 T Cell Abs (/uL)  Date Value  05/20/2013 280*  04/18/2012 530   01/04/2012 470      Assessment: His HIV infection remains under good control. He prefers to continue his current regimen.  I encouraged him to consider her calling the [place] quit line in coming up with a plan to quit smoking cigarettes completely.  I will check hepatitis C blood work as he might be a candidate for treatment soon.  Plan: 1. Continue Combivir and Viramune 2. Check blood work 3. Cigarette cessation counseling provided 4. Followup in 6 months   Cliffton AstersJohn Campbell, MD [facility] [phone] pager   [phone] cell 08/19/2013, 2:03 PM

## 2013-08-20 LAB — HEPATITIS B SURFACE ANTIBODY,QUALITATIVE

## 2013-08-20 LAB — PROTIME-INR
INR: 1.1 (ref <1.50)
Prothrombin Time: 14.2 seconds (ref 11.6–15.2)

## 2013-08-20 LAB — HEPATITIS A ANTIBODY, TOTAL: Hep A Total Ab: REACTIVE — AB

## 2013-08-20 LAB — HEPATITIS B SURFACE ANTIGEN: HEP B S AG: NEGATIVE

## 2013-08-20 LAB — ANA: ANA: NEGATIVE

## 2013-08-20 LAB — IRON: Iron: 137 ug/dL (ref 42–165)

## 2013-08-20 LAB — HEPATITIS B CORE ANTIBODY, TOTAL: Hep B Core Total Ab: REACTIVE — AB

## 2013-08-22 LAB — HEPATITIS C RNA QUANTITATIVE
HCV QUANT LOG: 6.11 {Log} — AB (ref <1.18)
HCV Quantitative: 1289802 IU/mL — ABNORMAL HIGH (ref <15)

## 2013-08-27 LAB — HEPATITIS C GENOTYPE

## 2013-08-29 ENCOUNTER — Telehealth: Payer: Self-pay | Admitting: *Deleted

## 2013-08-29 NOTE — Telephone Encounter (Signed)
Requested pt return call and leave a message giving which day he would prefer the [facility] scheduled[facility]

## 2013-08-29 NOTE — Addendum Note (Signed)
Addended by: Jennet MaduroESTRIDGE, Tracye Szuch D on: 08/29/2013 04:46 PM   Modules accepted: Orders

## 2013-10-01 ENCOUNTER — Other Ambulatory Visit: Payer: Self-pay | Admitting: *Deleted

## 2013-10-01 DIAGNOSIS — B20 Human immunodeficiency virus [HIV] disease: Secondary | ICD-10-CM

## 2013-10-01 MED ORDER — NEVIRAPINE 200 MG PO TABS: 200.0000 mg | ORAL_TABLET | Freq: Two times a day (BID) | ORAL | Status: DC

## 2013-10-01 MED ORDER — LAMIVUDINE-ZIDOVUDINE 150-300 MG PO TABS: ORAL_TABLET | ORAL | Status: DC

## 2013-12-01 ENCOUNTER — Other Ambulatory Visit: Payer: Self-pay | Admitting: Internal Medicine

## 2013-12-01 DIAGNOSIS — B20 Human immunodeficiency virus [HIV] disease: Secondary | ICD-10-CM

## 2014-01-12 ENCOUNTER — Other Ambulatory Visit: Payer: Self-pay | Admitting: Internal Medicine

## 2014-04-12 ENCOUNTER — Other Ambulatory Visit: Payer: Self-pay | Admitting: Internal Medicine

## 2014-04-13 ENCOUNTER — Other Ambulatory Visit: Payer: Self-pay | Admitting: *Deleted

## 2014-04-13 DIAGNOSIS — I10 Essential (primary) hypertension: Secondary | ICD-10-CM

## 2014-04-13 DIAGNOSIS — Z8639 Personal history of other endocrine, nutritional and metabolic disease: Secondary | ICD-10-CM

## 2014-04-13 DIAGNOSIS — B20 Human immunodeficiency virus [HIV] disease: Secondary | ICD-10-CM

## 2014-04-13 MED ORDER — HYDROCHLOROTHIAZIDE 25 MG PO TABS: 25.0000 mg | ORAL_TABLET | Freq: Every day | ORAL | Status: DC

## 2014-04-13 MED ORDER — METOPROLOL TARTRATE 50 MG PO TABS: 50.0000 mg | ORAL_TABLET | Freq: Two times a day (BID) | ORAL | Status: DC

## 2014-04-13 MED ORDER — NEVIRAPINE 200 MG PO TABS: 200.0000 mg | ORAL_TABLET | Freq: Two times a day (BID) | ORAL | Status: DC

## 2014-04-13 MED ORDER — PRAVASTATIN SODIUM 20 MG PO TABS: 20.0000 mg | ORAL_TABLET | Freq: Every day | ORAL | Status: DC

## 2014-04-13 MED ORDER — LAMIVUDINE-ZIDOVUDINE 150-300 MG PO TABS: 1.0000 | ORAL_TABLET | Freq: Two times a day (BID) | ORAL | Status: DC

## 2014-04-23 ENCOUNTER — Ambulatory Visit: Payer: Medicare HMO

## 2014-04-23 ENCOUNTER — Other Ambulatory Visit: Payer: Medicare HMO

## 2014-04-23 DIAGNOSIS — Z113 Encounter for screening for infections with a predominantly sexual mode of transmission: Secondary | ICD-10-CM

## 2014-04-23 DIAGNOSIS — B20 Human immunodeficiency virus [HIV] disease: Secondary | ICD-10-CM

## 2014-04-23 DIAGNOSIS — Z79899 Other long term (current) drug therapy: Secondary | ICD-10-CM

## 2014-04-24 ENCOUNTER — Other Ambulatory Visit: Payer: Self-pay | Admitting: *Deleted

## 2014-04-24 DIAGNOSIS — B20 Human immunodeficiency virus [HIV] disease: Secondary | ICD-10-CM

## 2014-04-24 LAB — LIPID PANEL
CHOL/HDL RATIO: 1.4 ratio
CHOLESTEROL: 196 mg/dL (ref 0–200)
HDL: 138 mg/dL (ref >=40)
LDL Cholesterol: 46 mg/dL (ref 0–99)
TRIGLYCERIDES: 62 mg/dL (ref <150)
VLDL: 12 mg/dL (ref 0–40)

## 2014-04-24 LAB — T-HELPER CELL (CD4) - (RCID CLINIC ONLY)
CD4 T CELL HELPER: 39 % (ref 33–55)
CD4 T Cell Abs: 520 /uL (ref 400–2700)

## 2014-04-24 LAB — COMPREHENSIVE METABOLIC PANEL
ALT: 38 U/L (ref 0–53)
AST: 48 U/L — AB (ref 0–37)
Albumin: 3.6 g/dL (ref 3.5–5.2)
Alkaline Phosphatase: 87 U/L (ref 39–117)
BILIRUBIN TOTAL: 0.7 mg/dL (ref 0.2–1.2)
BUN: 16 mg/dL (ref 6–23)
CALCIUM: 9 mg/dL (ref 8.4–10.5)
CHLORIDE: 104 meq/L (ref 96–112)
CO2: 26 meq/L (ref 19–32)
Creat: 0.92 mg/dL (ref 0.50–1.35)
Glucose, Bld: 89 mg/dL (ref 70–99)
Potassium: 4.2 mEq/L (ref 3.5–5.3)
Sodium: 140 mEq/L (ref 135–145)
Total Protein: 6.7 g/dL (ref 6.0–8.3)

## 2014-04-24 LAB — CBC
HCT: 37.3 % — ABNORMAL LOW (ref 39.0–52.0)
HEMOGLOBIN: 12.9 g/dL — AB (ref 13.0–17.0)
MCH: 39 pg — AB (ref 26.0–34.0)
MCHC: 34.6 g/dL (ref 30.0–36.0)
MCV: 112.7 fL — AB (ref 78.0–100.0)
MPV: 10.3 fL (ref 8.6–12.4)
Platelets: 115 10*3/uL — ABNORMAL LOW (ref 150–400)
RBC: 3.31 MIL/uL — AB (ref 4.22–5.81)
RDW: 14.5 % (ref 11.5–15.5)
WBC: 2.5 10*3/uL — ABNORMAL LOW (ref 4.0–10.5)

## 2014-04-24 LAB — RPR

## 2014-04-24 LAB — PATHOLOGIST SMEAR REVIEW

## 2014-04-24 MED ORDER — LAMIVUDINE-ZIDOVUDINE 150-300 MG PO TABS: 1.0000 | ORAL_TABLET | Freq: Two times a day (BID) | ORAL | Status: DC

## 2014-04-24 MED ORDER — NEVIRAPINE 200 MG PO TABS: 200.0000 mg | ORAL_TABLET | Freq: Two times a day (BID) | ORAL | Status: DC

## 2014-04-24 NOTE — Telephone Encounter (Signed)
ADAP Application 

## 2014-04-25 LAB — HIV-1 RNA QUANT-NO REFLEX-BLD
HIV 1 RNA Quant: <20 copies/mL (ref <20)
HIV-1 RNA Quant, Log: <1.3 {Log} (ref <1.30)

## 2014-05-05 ENCOUNTER — Encounter: Payer: Self-pay | Admitting: Internal Medicine

## 2014-05-05 ENCOUNTER — Ambulatory Visit (INDEPENDENT_AMBULATORY_CARE_PROVIDER_SITE_OTHER): Payer: Medicare HMO | Admitting: Internal Medicine

## 2014-05-05 VITALS — BP 143/79 | HR 78 | Temp 97.6°F | Wt 167.0 lb

## 2014-05-05 DIAGNOSIS — B20 Human immunodeficiency virus [HIV] disease: Secondary | ICD-10-CM

## 2014-05-05 DIAGNOSIS — B182 Chronic viral hepatitis C: Secondary | ICD-10-CM

## 2014-05-05 NOTE — Progress Notes (Signed)
Patient ID: Anthony Ferguson, male   DOB: 05/31/1950, 64 y.o.   MRN: [id number]          Patient Active Problem List   Diagnosis Date Noted  . HIV INFECTION 03/03/2006  . HEPATITIS C 03/03/2006  . DIABETES MELLITUS 03/03/2006  . TOBACCO USER 03/03/2006  . CORONARY ARTERY DISEASE 03/03/2006    Patient's Medications  New Prescriptions   No medications on file  Previous Medications   ASPIRIN 325 MG TABLET    Take 325 mg by mouth daily.     GLIPIZIDE (GLUCOTROL XL) 10 MG 24 HR TABLET    TAKE 1 TABLET BY MOUTH DAILY   HYDROCHLOROTHIAZIDE (HYDRODIURIL) 25 MG TABLET    Take 1 tablet (25 mg total) by mouth daily. for blood pressure   LAMIVUDINE-ZIDOVUDINE (COMBIVIR) 150-300 MG PER TABLET    Take 1 tablet by mouth 2 (two) times daily.   METOPROLOL (LOPRESSOR) 50 MG TABLET    TAKE 1 TABLET BY MOUTH TWICE DAILY FOR BLOOD PRESSURE   NEVIRAPINE (VIRAMUNE) 200 MG TABLET    Take 1 tablet (200 mg total) by mouth 2 (two) times daily.   PRAVASTATIN (PRAVACHOL) 20 MG TABLET    Take 1 tablet (20 mg total) by mouth daily.   VERAPAMIL (VERELAN PM) 200 MG 24 HR CAPSULE    Take 200 mg by mouth daily.    Modified Medications   No medications on file  Discontinued Medications   METOPROLOL (LOPRESSOR) 50 MG TABLET    Take 1 tablet (50 mg total) by mouth 2 (two) times daily. For Blood Pressure    Subjective: Anthony Ferguson is in for his routine HIV follow-up. He recalls missing only one dose of his Combivir and Viramune. This occurred when he was at a friend's house for a birthday party and began to rain. He stated his friend's house that night requiring him to miss his medication that was at home. He is feeling well without complaints. He has cut down but is continuing to smoke cigarettes, about one pack each week. He has no current plan to quit completely.  Review of Systems: Constitutional: negative Eyes: negative Ears, nose, mouth, throat, and face: negative Respiratory: negative Cardiovascular:  negative Gastrointestinal: negative Genitourinary:negative  Past Medical History  Diagnosis Date  . Myocardial infarct   . Diabetes mellitus   . Hypertension   . Hypercholesteremia   . HIV (human immunodeficiency virus infection)     History  Substance Use Topics  . Smoking status: Current Every Day Smoker -- 0.50 packs/day    Types: Cigarettes  . Smokeless tobacco: Not on file     Comment: Trying to slow down  . Alcohol Use: No     Comment: used to    No family history on file.  No Known Allergies  Objective: Temp: 97.6 F (36.4 C) (03/15 1431) Temp Source: Oral (03/15 1431) BP: 143/79 mmHg (03/15 1431) Pulse Rate: 78 (03/15 1431) Body mass index is 20.87 kg/(m^2).  General: He is in good spirits Oral: No oropharyngeal lesions Skin: No rash Lungs: Clear Cor: Regular S1 and S2 with no murmurs Abdomen: Soft and nontender. I cannot palpate a liver, spleen or other masses Mood: Bright and normal  Lab Results Lab Results  Component Value Date   WBC 2.5* 04/23/2014   HGB 12.9* 04/23/2014   HCT 37.3* 04/23/2014   MCV 112.7* 04/23/2014   PLT 115* 04/23/2014    Lab Results  Component Value Date   CREATININE 0.92 04/23/2014  BUN 16 04/23/2014   NA 140 04/23/2014   K 4.2 04/23/2014   CL 104 04/23/2014   CO2 26 04/23/2014    Lab Results  Component Value Date   ALT 38 04/23/2014   AST 48* 04/23/2014   ALKPHOS 87 04/23/2014   BILITOT 0.7 04/23/2014    Lab Results  Component Value Date   CHOL 196 04/23/2014   HDL 138 04/23/2014   LDLCALC 46 04/23/2014   TRIG 62 04/23/2014   CHOLHDL 1.4 04/23/2014    Lab Results HIV 1 RNA QUANT (copies/mL)  Date Value  04/23/2014 <20  05/20/2013 <20  04/18/2012 <20   CD4 T CELL ABS  Date Value  04/23/2014 520 /uL  05/20/2013 280 /uL*  04/18/2012 530 cmm   Hepatitis C viral load 04/23/2014: 5,366,440     genotype Ia   Assessment: His HIV infection remains under excellent control.  He has chronic active  hepatitis C. I will check ultrasound with elastography and begin the process of approval for Harvoni therapy. This may require a change in his HIV regimen to avoid drug drug interactions.  I talked him about the importance of quitting cigarettes completely.  Plan: 1. Continue Combivir and Viramune for now 2. Ultrasound with elastography 3. Start approval process for Harvoni therapy for hepatitis C 4. Cigarette cessation counseling provided 5. Follow-up in 2 months   Cliffton Asters, MD [facility] [phone] pager   [phone] cell 05/05/2014, 2:56 PM

## 2014-05-05 NOTE — Progress Notes (Signed)
HPI: Anthony Ferguson is a 64 y.o. male who presents to the [facility] today for follow-up of his HIV/Hep C co-infection.  Allergies: No Known Allergies  Vitals: Temp: 97.6 F (36.4 C) (03/15 1431) Temp Source: Oral (03/15 1431) BP: 143/79 mmHg (03/15 1431) Pulse Rate: 78 (03/15 1431)  Past Medical History: Past Medical History  Diagnosis Date  . Myocardial infarct   . Diabetes mellitus   . Hypertension   . Hypercholesteremia   . HIV (human immunodeficiency virus infection)     Social History: History   Social History  . Marital Status: Legally Separated    Spouse Name: N/A  . Number of Children: N/A  . Years of Education: N/A   Social History Main Topics  . Smoking status: Current Every Day Smoker -- 0.50 packs/day    Types: Cigarettes  . Smokeless tobacco: Not on file     Comment: Trying to slow down  . Alcohol Use: No     Comment: used to  . Drug Use: No     Comment: used to  . Sexual Activity: Not on file     Comment: declined comdons   Other Topics Concern  . None   Social History Narrative   Current Regimen: Combivir + Viramune  Labs: HIV 1 RNA QUANT (copies/mL)  Date Value  04/23/2014 <20  05/20/2013 <20  04/18/2012 <20   CD4 T CELL ABS  Date Value  04/23/2014 520 /uL  05/20/2013 280 /uL*  04/18/2012 530 cmm   HEP B S AB (no units)  Date Value  08/19/2013 INDETER*   HEPATITIS B SURFACE AG (no units)  Date Value  08/19/2013 NEGATIVE   HCV AB (no units)  Date Value  04/16/2006 Yes    CrCl: CrCl cannot be calculated (Unknown ideal weight.).  Lipids:    Component Value Date/Time   CHOL 196 04/23/2014 1426   TRIG 62 04/23/2014 1426   HDL 138 04/23/2014 1426   CHOLHDL 1.4 04/23/2014 1426   VLDL 12 04/23/2014 1426   LDLCALC 46 04/23/2014 1426    Assessment: 64 yo m who presents to the [facility] today for follow-up of his HIV/Hep C co-infection.  He has not started medication for his Hep C yet.  He has been on Combivir + Viramune for  several years and is requesting not to be taken off of that regimen. I explained to him that he could stay on his current HIV regimen or switch to a once daily combination pill, such as Complera, when he starts his Harvoni. He chose to continue on his Viramune and Combivir since it has worked for him for so long. I educated him on adherence and counseled him on his regimen. Will work on getting him approved for [employer]  Recommendations: Continue Combivir + Viramune Start Harvoni once approved  Harmonie Verrastro L. Roseanne RenoStewart, PharmD Clinical Infectious Disease Pharmacist [facility] 05/05/2014, 3:10 PM

## 2014-07-13 ENCOUNTER — Telehealth: Payer: Self-pay | Admitting: *Deleted

## 2014-07-13 NOTE — Telephone Encounter (Signed)
PA for Viramune rx received.  [facility]  May take from 48-72 hours for a response.  Pharmacy [facility]

## 2014-07-14 NOTE — Telephone Encounter (Signed)
07/13/13 - PA approved, pharmacy notified

## 2014-09-30 ENCOUNTER — Other Ambulatory Visit: Payer: Medicare HMO

## 2014-09-30 ENCOUNTER — Ambulatory Visit: Payer: Medicare HMO

## 2014-09-30 DIAGNOSIS — B20 Human immunodeficiency virus [HIV] disease: Secondary | ICD-10-CM

## 2014-09-30 LAB — CBC WITH DIFFERENTIAL/PLATELET
Basophils Absolute: 0 10*3/uL (ref 0.0–0.1)
Basophils Relative: 1 % (ref 0–1)
EOS PCT: 6 % — AB (ref 0–5)
Eosinophils Absolute: 0.1 10*3/uL (ref 0.0–0.7)
HCT: 35.2 % — ABNORMAL LOW (ref 39.0–52.0)
Hemoglobin: 12.8 g/dL — ABNORMAL LOW (ref 13.0–17.0)
LYMPHS PCT: 40 % (ref 12–46)
Lymphs Abs: 1 10*3/uL (ref 0.7–4.0)
MCH: 39.5 pg — ABNORMAL HIGH (ref 26.0–34.0)
MCHC: 36.4 g/dL — ABNORMAL HIGH (ref 30.0–36.0)
MCV: 108.6 fL — ABNORMAL HIGH (ref 78.0–100.0)
MONOS PCT: 16 % — AB (ref 3–12)
MPV: 10.4 fL (ref 8.6–12.4)
Monocytes Absolute: 0.4 10*3/uL (ref 0.1–1.0)
NEUTROS ABS: 0.9 10*3/uL — AB (ref 1.7–7.7)
NEUTROS PCT: 37 % — AB (ref 43–77)
PLATELETS: 88 10*3/uL — AB (ref 150–400)
RBC: 3.24 MIL/uL — AB (ref 4.22–5.81)
RDW: 14.1 % (ref 11.5–15.5)
WBC: 2.4 10*3/uL — AB (ref 4.0–10.5)

## 2014-09-30 LAB — COMPREHENSIVE METABOLIC PANEL
ALT: 49 U/L — AB (ref 9–46)
AST: 70 U/L — AB (ref 10–35)
Albumin: 3.2 g/dL — ABNORMAL LOW (ref 3.6–5.1)
Alkaline Phosphatase: 94 U/L (ref 40–115)
BUN: 12 mg/dL (ref 7–25)
CALCIUM: 9 mg/dL (ref 8.6–10.3)
CHLORIDE: 104 mmol/L (ref 98–110)
CO2: 28 mmol/L (ref 20–31)
Creat: 0.9 mg/dL (ref 0.70–1.25)
Glucose, Bld: 96 mg/dL (ref 65–99)
Potassium: 4.5 mmol/L (ref 3.5–5.3)
SODIUM: 139 mmol/L (ref 135–146)
Total Bilirubin: 0.7 mg/dL (ref 0.2–1.2)
Total Protein: 6.3 g/dL (ref 6.1–8.1)

## 2014-09-30 NOTE — Addendum Note (Signed)
Addended by: Lurlean Leyden on: 09/30/2014 02:54 PM   Modules accepted: Orders

## 2014-10-01 LAB — T-HELPER CELL (CD4) - (RCID CLINIC ONLY)
CD4 % Helper T Cell: 39 % (ref 33–55)
CD4 T CELL ABS: 350 /uL — AB (ref 400–2700)

## 2014-10-03 LAB — HIV-1 RNA QUANT-NO REFLEX-BLD: HIV-1 RNA Quant, Log: <1.3 {Log} (ref <1.30)

## 2014-10-27 ENCOUNTER — Ambulatory Visit: Payer: Medicare HMO | Admitting: Internal Medicine

## 2014-11-02 ENCOUNTER — Emergency Department (HOSPITAL_COMMUNITY)
Admission: EM | Admit: 2014-11-02 | Discharge: 2014-11-02 | Disposition: A | Discharge status: Home / Self Care | Payer: Medicare HMO | Attending: Emergency Medicine | Admitting: Emergency Medicine

## 2014-11-02 ENCOUNTER — Encounter (HOSPITAL_COMMUNITY): Payer: Self-pay | Admitting: Emergency Medicine

## 2014-11-02 DIAGNOSIS — S80862A Insect bite (nonvenomous), left lower leg, initial encounter: Secondary | ICD-10-CM | POA: Insufficient documentation

## 2014-11-02 DIAGNOSIS — I252 Old myocardial infarction: Secondary | ICD-10-CM | POA: Diagnosis not present

## 2014-11-02 DIAGNOSIS — W57XXXA Bitten or stung by nonvenomous insect and other nonvenomous arthropods, initial encounter: Secondary | ICD-10-CM | POA: Diagnosis not present

## 2014-11-02 DIAGNOSIS — S90561A Insect bite (nonvenomous), right ankle, initial encounter: Secondary | ICD-10-CM | POA: Diagnosis not present

## 2014-11-02 DIAGNOSIS — E119 Type 2 diabetes mellitus without complications: Secondary | ICD-10-CM | POA: Insufficient documentation

## 2014-11-02 DIAGNOSIS — Y9389 Activity, other specified: Secondary | ICD-10-CM | POA: Insufficient documentation

## 2014-11-02 DIAGNOSIS — Z21 Asymptomatic human immunodeficiency virus [HIV] infection status: Secondary | ICD-10-CM | POA: Diagnosis not present

## 2014-11-02 DIAGNOSIS — I10 Essential (primary) hypertension: Secondary | ICD-10-CM | POA: Diagnosis not present

## 2014-11-02 DIAGNOSIS — Z7982 Long term (current) use of aspirin: Secondary | ICD-10-CM | POA: Insufficient documentation

## 2014-11-02 DIAGNOSIS — Z79899 Other long term (current) drug therapy: Secondary | ICD-10-CM | POA: Insufficient documentation

## 2014-11-02 DIAGNOSIS — Z720 Tobacco use: Secondary | ICD-10-CM | POA: Diagnosis not present

## 2014-11-02 DIAGNOSIS — S80861A Insect bite (nonvenomous), right lower leg, initial encounter: Secondary | ICD-10-CM | POA: Diagnosis not present

## 2014-11-02 DIAGNOSIS — Y92009 Unspecified place in unspecified non-institutional (private) residence as the place of occurrence of the external cause: Secondary | ICD-10-CM | POA: Insufficient documentation

## 2014-11-02 DIAGNOSIS — E780 Pure hypercholesterolemia: Secondary | ICD-10-CM | POA: Insufficient documentation

## 2014-11-02 DIAGNOSIS — S90562A Insect bite (nonvenomous), left ankle, initial encounter: Secondary | ICD-10-CM | POA: Diagnosis not present

## 2014-11-02 DIAGNOSIS — Y998 Other external cause status: Secondary | ICD-10-CM | POA: Insufficient documentation

## 2014-11-02 MED ORDER — DIPHENHYDRAMINE HCL 25 MG PO CAPS: 25.0000 mg | ORAL_CAPSULE | Freq: Four times a day (QID) | ORAL | Status: DC | PRN

## 2014-11-02 MED ORDER — DIPHENHYDRAMINE HCL 25 MG PO CAPS
25.0000 mg | ORAL_CAPSULE | Freq: Once | ORAL | Status: AC
  Administered 2014-11-02: 25 mg via ORAL
  Filled 2014-11-02: qty 1

## 2014-11-02 MED ORDER — TRIAMCINOLONE ACETONIDE 0.1 % EX CREA: 1.0000 "application " | TOPICAL_CREAM | Freq: Three times a day (TID) | CUTANEOUS | Status: DC

## 2014-11-02 MED ORDER — HYDROCORTISONE 1 % EX CREA
TOPICAL_CREAM | Freq: Once | CUTANEOUS | Status: AC
  Administered 2014-11-02: 1 via TOPICAL
  Filled 2014-11-02: qty 1.5

## 2014-11-02 NOTE — ED Notes (Signed)
Pt reports insect bites to his lower legs. Pt states he saw a little brown bug crawling on his leg.

## 2014-11-02 NOTE — Discharge Instructions (Signed)
Bedbugs °Bedbugs are tiny bugs that live in and around beds. They come out at night and bite people lying in bed. Bedbug bites rarely cause a medical problem. The bites do cause red, itchy bumps. °HOME CARE °· Only take medicine as told by your doctor. °· Wear pajamas with long sleeves and pant legs. °· Call a pest control expert. You may need to throw away your mattress. Ask the pest control expert what you can do to keep the bedbugs from coming back. You may need to: °¨ Put a plastic cover over your mattress. °¨ Wash your clothes and bedding in hot water. Dry them in a hot dryer. The temperature should be hotter than 120° F (48.9° C). °¨ Vacuum all around your bed often. °¨ Check all used furniture, bedding, or clothes for bedbugs before you bring them into your house. °¨ Remove bird nests and bat perches around your home. °· After you travel, check your clothes and luggage for bedbugs before you bring them into your house. If you find any bedbugs, throw those items away. °GET HELP RIGHT AWAY IF: °· You have a fever. °· You have red bug bites that keep coming back. °· You have red bug bites that itch badly. °· You have bug bites that cause a skin rash. °· You have scratch marks that are red and sore. °MAKE SURE YOU: °· Understand these instructions. °· Will watch your condition. °· Will get help right away if you are not doing well or get worse. °Document Released: 05/24/2010 Document Revised: 05/01/2011 Document Reviewed: 05/24/2010 °ExitCare® Patient Information ©2015 [employer]. This information is not intended to replace advice given to you by your health care provider. Make sure you discuss any questions you have with your health care provider. ° °

## 2014-11-04 NOTE — ED Provider Notes (Signed)
CSN: [id number]     Arrival date & time 11/02/14  2136 History   First MD Initiated Contact with Patient 11/02/14 2204     Chief Complaint  Patient presents with  . Insect Bite     (Consider location/radiation/quality/duration/timing/severity/associated sxs/prior Treatment) HPI   Anthony Ferguson is a 64 y.o. male who presents to the Emergency Department complaining of multiple "bug bites" to both lower legs and ankles that occurred on the evening prior to arrival . Complains of itching.   He states that he was staying at a friend's house and noticed itching to his legs during the night.  States that he saw a brown bug crawling on his leg.  He denies pain, swelling, redness or blisters.  He has not tried any medications or therapies   Past Medical History  Diagnosis Date  . Myocardial infarct   . Diabetes mellitus   . Hypertension   . Hypercholesteremia   . HIV (human immunodeficiency virus infection)    Past Surgical History  Procedure Laterality Date  . Cardiac surgery    . Circumcision     Family History  Problem Relation Age of Onset  . Diabetes Mother   . Heart failure Mother    Social History  Substance Use Topics  . Smoking status: Current Every Day Smoker -- 0.50 packs/day    Types: Cigarettes  . Smokeless tobacco: Never Used     Comment: Trying to slow down  . Alcohol Use: Yes     Comment: occas    Review of Systems  Constitutional: Negative for fever, chills, activity change and appetite change.  HENT: Negative for facial swelling, sore throat and trouble swallowing.   Respiratory: Negative for chest tightness, shortness of breath and wheezing.   Musculoskeletal: Negative for neck pain and neck stiffness.  Skin: Positive for rash. Negative for wound.  Neurological: Negative for dizziness, weakness, numbness and headaches.  All other systems reviewed and are negative.     Allergies  Hctz  Home Medications   Prior to Admission medications    Medication Sig Start Date End Date Taking? Authorizing Provider  aspirin 325 MG tablet Take 325 mg by mouth daily.     Yes Historical Provider, MD  glipiZIDE (GLUCOTROL XL) 10 MG 24 hr tablet TAKE 1 TABLET BY MOUTH DAILY 01/13/14  Yes Cliffton Asters, MD  lamiVUDine-zidovudine (COMBIVIR) 150-300 MG per tablet Take 1 tablet by mouth 2 (two) times daily. 04/24/14  Yes Gardiner Barefoot, MD  metoprolol (LOPRESSOR) 50 MG tablet TAKE 1 TABLET BY MOUTH TWICE DAILY FOR BLOOD PRESSURE 01/13/14  Yes Cliffton Asters, MD  nevirapine (VIRAMUNE) 200 MG tablet Take 1 tablet (200 mg total) by mouth 2 (two) times daily. 04/24/14  Yes Gardiner Barefoot, MD  pravastatin (PRAVACHOL) 20 MG tablet Take 1 tablet (20 mg total) by mouth daily. 04/13/14  Yes Cliffton Asters, MD  verapamil (VERELAN PM) 200 MG 24 hr capsule Take 200 mg by mouth daily.   06/02/10  Yes Lina Sayre, MD  diphenhydrAMINE (BENADRYL) 25 mg capsule Take 1 capsule (25 mg total) by mouth every 6 (six) hours as needed. 11/02/14   Ulmer Degen, PA-C  hydrochlorothiazide (HYDRODIURIL) 25 MG tablet Take 1 tablet (25 mg total) by mouth daily. for blood pressure Patient not taking: Reported on 11/02/2014 04/13/14   Cliffton Asters, MD  triamcinolone cream (KENALOG) 0.1 % Apply 1 application topically 3 (three) times daily. 11/02/14   Rosemary Pentecost, PA-C   BP 141/87 mmHg  Pulse 70  Temp(Src) 98 F (36.7 C) (Oral)  Resp 18  Ht  (1.905 m)  Wt 170 lb (77.111 kg)  BMI 21.25 kg/m2  SpO2 100% Physical Exam  Constitutional: He is oriented to person, place, and time. He appears well-developed and well-nourished. No distress.  HENT:  Head: Normocephalic and atraumatic.  Mouth/Throat: Oropharynx is clear and moist.  Neck: Normal range of motion. Neck supple.  Cardiovascular: Normal rate, regular rhythm, normal heart sounds and intact distal pulses.   No murmur heard. Pulmonary/Chest: Effort normal and breath sounds normal. No respiratory distress.  Musculoskeletal: He  exhibits no edema or tenderness.  Lymphadenopathy:    He has no cervical adenopathy.  Neurological: He is alert and oriented to person, place, and time. He exhibits normal muscle tone. Coordination normal.  Skin: Skin is warm. Rash noted. There is erythema.  Multiple, small erythematous papules to the bilateral lower extremities.  No pustules or edema.    Nursing note and vitals reviewed.   ED Course  Procedures (including critical care time) Labs Review Labs Reviewed - No data to display  Imaging Review No results found. I have personally reviewed and evaluated these images and lab results as part of my medical decision-making.   EKG Interpretation None      MDM   Final diagnoses:  Multiple insect bites    Pt with likely bed bug bites.  No concerning sx's for cellulitis.  He is well appearing, ambulates with a steady gait.  stable for d/c.  rx's for benadryl and triamcinolone cream.      Pauline Aus, PA-C 11/05/14 0010  Devoria Albe, MD 11/05/14 2306

## 2014-11-19 ENCOUNTER — Other Ambulatory Visit: Payer: Self-pay | Admitting: Internal Medicine

## 2014-11-23 ENCOUNTER — Encounter (HOSPITAL_COMMUNITY): Payer: Self-pay | Admitting: Emergency Medicine

## 2014-11-23 ENCOUNTER — Emergency Department (HOSPITAL_COMMUNITY)
Admission: EM | Admit: 2014-11-23 | Discharge: 2014-11-23 | Disposition: A | Discharge status: Home / Self Care | Payer: Medicare HMO | Attending: Emergency Medicine | Admitting: Emergency Medicine

## 2014-11-23 DIAGNOSIS — Z21 Asymptomatic human immunodeficiency virus [HIV] infection status: Secondary | ICD-10-CM | POA: Diagnosis not present

## 2014-11-23 DIAGNOSIS — Y9289 Other specified places as the place of occurrence of the external cause: Secondary | ICD-10-CM | POA: Diagnosis not present

## 2014-11-23 DIAGNOSIS — I252 Old myocardial infarction: Secondary | ICD-10-CM | POA: Diagnosis not present

## 2014-11-23 DIAGNOSIS — Z9889 Other specified postprocedural states: Secondary | ICD-10-CM | POA: Insufficient documentation

## 2014-11-23 DIAGNOSIS — E119 Type 2 diabetes mellitus without complications: Secondary | ICD-10-CM | POA: Diagnosis not present

## 2014-11-23 DIAGNOSIS — E7800 Pure hypercholesterolemia, unspecified: Secondary | ICD-10-CM | POA: Diagnosis not present

## 2014-11-23 DIAGNOSIS — X58XXXA Exposure to other specified factors, initial encounter: Secondary | ICD-10-CM | POA: Insufficient documentation

## 2014-11-23 DIAGNOSIS — Z720 Tobacco use: Secondary | ICD-10-CM | POA: Diagnosis not present

## 2014-11-23 DIAGNOSIS — Y9389 Activity, other specified: Secondary | ICD-10-CM | POA: Insufficient documentation

## 2014-11-23 DIAGNOSIS — I10 Essential (primary) hypertension: Secondary | ICD-10-CM | POA: Insufficient documentation

## 2014-11-23 DIAGNOSIS — F1010 Alcohol abuse, uncomplicated: Secondary | ICD-10-CM

## 2014-11-23 DIAGNOSIS — F1210 Cannabis abuse, uncomplicated: Secondary | ICD-10-CM | POA: Diagnosis not present

## 2014-11-23 DIAGNOSIS — R21 Rash and other nonspecific skin eruption: Secondary | ICD-10-CM | POA: Diagnosis present

## 2014-11-23 DIAGNOSIS — F1410 Cocaine abuse, uncomplicated: Secondary | ICD-10-CM

## 2014-11-23 DIAGNOSIS — Y998 Other external cause status: Secondary | ICD-10-CM | POA: Insufficient documentation

## 2014-11-23 DIAGNOSIS — S40819A Abrasion of unspecified upper arm, initial encounter: Secondary | ICD-10-CM | POA: Insufficient documentation

## 2014-11-23 LAB — COMPREHENSIVE METABOLIC PANEL
ALBUMIN: 3.9 g/dL (ref 3.5–5.0)
ALT: 60 U/L (ref 17–63)
ANION GAP: 9 (ref 5–15)
AST: 78 U/L — ABNORMAL HIGH (ref 15–41)
Alkaline Phosphatase: 96 U/L (ref 38–126)
BUN: 18 mg/dL (ref 6–20)
CALCIUM: 9.6 mg/dL (ref 8.9–10.3)
CHLORIDE: 104 mmol/L (ref 101–111)
CO2: 28 mmol/L (ref 22–32)
Creatinine, Ser: 0.9 mg/dL (ref 0.61–1.24)
GFR calc non Af Amer: >60 mL/min (ref >60)
Glucose, Bld: 132 mg/dL — ABNORMAL HIGH (ref 65–99)
POTASSIUM: 4.5 mmol/L (ref 3.5–5.1)
SODIUM: 141 mmol/L (ref 135–145)
Total Bilirubin: 1.6 mg/dL — ABNORMAL HIGH (ref 0.3–1.2)
Total Protein: 7.6 g/dL (ref 6.5–8.1)

## 2014-11-23 LAB — ETHANOL: Alcohol, Ethyl (B): 7 mg/dL — ABNORMAL HIGH (ref <5)

## 2014-11-23 LAB — RAPID URINE DRUG SCREEN, HOSP PERFORMED
Amphetamines: NOT DETECTED
BENZODIAZEPINES: NOT DETECTED
Barbiturates: NOT DETECTED
COCAINE: POSITIVE — AB
OPIATES: NOT DETECTED
TETRAHYDROCANNABINOL: POSITIVE — AB

## 2014-11-23 LAB — CBC WITH DIFFERENTIAL/PLATELET
BASOS PCT: 0 %
Basophils Absolute: 0 10*3/uL (ref 0.0–0.1)
EOS ABS: 0 10*3/uL (ref 0.0–0.7)
EOS PCT: 1 %
HCT: 37.9 % — ABNORMAL LOW (ref 39.0–52.0)
Hemoglobin: 13.7 g/dL (ref 13.0–17.0)
LYMPHS ABS: 1.3 10*3/uL (ref 0.7–4.0)
Lymphocytes Relative: 32 %
MCH: 40.9 pg — AB (ref 26.0–34.0)
MCHC: 36.1 g/dL — AB (ref 30.0–36.0)
MCV: 113.1 fL — ABNORMAL HIGH (ref 78.0–100.0)
MONOS PCT: 11 %
Monocytes Absolute: 0.4 10*3/uL (ref 0.1–1.0)
Neutro Abs: 2.2 10*3/uL (ref 1.7–7.7)
Neutrophils Relative %: 56 %
PLATELETS: 101 10*3/uL — AB (ref 150–400)
RBC: 3.35 MIL/uL — ABNORMAL LOW (ref 4.22–5.81)
RDW: 13.4 % (ref 11.5–15.5)
WBC: 4 10*3/uL (ref 4.0–10.5)

## 2014-11-23 LAB — LIPASE, BLOOD: Lipase: 23 U/L (ref 22–51)

## 2014-11-23 NOTE — ED Notes (Signed)
MD at bedside. 

## 2014-11-23 NOTE — Discharge Instructions (Signed)
°Emergency Department Resource Guide °1) Find a Doctor and Pay Out of Pocket °Although you won't have to find out who is covered by your insurance plan, it is a good idea to ask around and get recommendations. You will then need to call the office and see if the doctor you have chosen will accept you as a new patient and what types of options they offer for patients who are self-pay. Some doctors offer discounts or will set up payment plans for their patients who do not have insurance, but you will need to ask so you aren't surprised when you get to your appointment. ° °2) Contact Your Local Health Department °Not all health departments have doctors that can see patients for sick visits, but many do, so it is worth a call to see if yours does. If you don't know where your local health department is, you can check in your phone book. The CDC also has a tool to help you locate your state's health department, and many state websites also have listings of all of their local health departments. ° °3) Find a Walk-in Clinic °If your illness is not likely to be very severe or complicated, you may want to try a walk in clinic. These are popping up all over the country in pharmacies, drugstores, and shopping centers. They're usually staffed by nurse practitioners or physician assistants that have been trained to treat common illnesses and complaints. They're usually fairly quick and inexpensive. However, if you have serious medical issues or chronic medical problems, these are probably not your best option. ° °No Primary Care Doctor: °- Call [facility] Connect at  832-8000 - they can help you locate a primary care doctor that  accepts your insurance, provides certain services, etc. °- Physician Referral Service- 1-800-533-3463 ° °Chronic Pain Problems: °Organization         Address  Phone   Notes  °[facility]  (336) 297-2271 Patients need to be referred by their primary care doctor.  ° °Medication  Assistance: °Organization         Address  Phone   Notes  °[place] [facility] [address] °[place], [place] [address] (336) 641-8030 --Must be a resident of [place] °-- Must have NO insurance coverage whatsoever (no Medicaid/ Medicare, etc.) °-- The pt. MUST have a primary care doctor that directs their care regularly and follows them in the community °  °[facility]  (866) 331-1348   °[employer]  (888) 892-1162   ° °Agencies that provide inexpensive medical care: °Organization         Address  Phone   Notes  °[facility]  (336) 832-8035   °[facility]    (336) 832-7272   °[facility] [address] °[place], [place] [address] (336) 832-4777   °[facility] [address], °[place] (336) 271-4999   °[facility]    (336) 373-0678   °[facility]    (336) 272-1050   °[facility] ° [address], [place] Phone:  (336) 832-4444, Fax:  (336) 832-4440 Hours of Operation:  9 am - 6 pm, M-F.  Also accepts Medicaid/Medicare and self-pay.  °[facility] ° [address], [place] Phone: (336) 832-3150, Fax: (336) [phone]. Hours of Operation:  8:30 am - 5:30 pm, M-F.  Also accepts Medicaid and self-pay.  °[facility] [place] [address], [place] Phone: (336) 878-6027   °[facility] [address], [place], [place] (336)723-1848, Ext. 123 Mondays & Thursdays: 7-9 AM.  First 15 patients are seen on a first come, first serve basis. °  ° °Medicaid-accepting [place] Providers: ° °Organization         Address  Phone   Notes  °[facility] [address], [place] (336) 641-2100 Also accepts self-pay patients.  °[facility] [address], [place] ° (336) 856-9996   °[facility] [address], [place]  (336) 288-8857   °[facility] [address] [place] [address], [place] (336) 299-7000   °Veita Bland [address], [place]  ° (336) 373-1557 Only accepts [place] Access Medicaid patients after they have their name applied to their card.  ° °Self-Pay (no insurance) in [place]: ° °Organization         Address  Phone   Notes  °Sickle Cell Patients, [facility] [address], [place] (336) 832-1970   °[facility] [address], [place] (336) 832-4400   °[facility] ° [address], [place] (336) 992-4800   °[facility]/Dr. Osei-Bonsu ° [address] [place] [address], [place] or [address], [place] (336) 841-8500 Phone number for both [place] and [place] locations is the same.  °[facility] [address], [place] (336) 299-0000   °[facility] [address] [place] [address], [place] or [address] (336) 852-7530 °(336) 878-2260   °[facility] [address], [place] (336) 350-1642, phone; (336) 294-5005, fax Sees patients 1st and 3rd Saturday of every month.  Must not qualify for public or private insurance (i.e. Medicaid, Medicare, [place] Health Choice, Veterans' Benefits) • Household income should be no more than 200% of the poverty level •The clinic cannot treat you if you are pregnant or think you are pregnant • Sexually transmitted diseases are not treated at the clinic.  ° ° °Dental Care: °Organization         Address  Phone  Notes  °[place] [facility] [address], [place] (336) 641-6152 Accepts children up to age 21 who are enrolled in Medicaid or [place] Health Choice; pregnant women with a Medicaid card; and children who have applied for Medicaid or [place] Health Choice, but were declined, whose parents can pay a reduced fee at time of service.  °[place]  [facility] [place]  [address], [place] (336) 641-7733 Accepts children up to age 21 who are enrolled in Medicaid or [place] Health Choice; pregnant women with a Medicaid card; and children who have applied for Medicaid or [place] Health Choice, but were declined, whose parents can pay a reduced fee at time of service.  °[facility] ° [address], [place] (336) 641-4533 Patients are seen by appointment only. Walk-ins are not accepted. [facility] will see patients 18 years of age and older. °Monday - Tuesday (8am-5pm) °Most Wednesdays (8:30-5pm) °$30 per visit, cash only  °[facility] ° [address], [place] (336) 641-4533 Patients are seen by appointment only. Walk-ins are not accepted. [facility] will see patients 18 years of age and older. °One   Wednesday Evening (Monthly: Volunteer Based).  $30 per visit, cash only  °[facility]  (919) 537-3737 for adults; Children under age 4, call Graduate Pediatric Dentistry at (919) [phone]. Children aged 4-14, please call (919) [phone] to request a pediatric application. ° Dental services are provided in all areas of dental care including fillings, crowns and bridges, complete and partial dentures, implants, gum treatment, root canals, and extractions. Preventive care is also provided. Treatment is provided to both adults and children. °Patients are selected via a lottery and there is often a waiting list. °  °[facility] [address], °[place] ° (336) 763-8833 [email or web address] °  °[facility] [address], [place], [place] (336)723-1848, Ext. 123 Second and Fourth Thursday of each month, opens at 6:30 AM; Clinic ends at 9 AM.  Patients are seen on a first-come first-served basis, and a limited number are seen during each clinic.  ° °[facility] ° [address], [place], [place] (336) 723-7904    Eligibility Requirements °You must have lived in [place], [place], or [place] counties for at least the last three months. °  You cannot be eligible for state or federal sponsored healthcare insurance, including Veterans Administration, Medicaid, or Medicare. °  You generally cannot be eligible for healthcare insurance through your employer.  °  How to apply: °Eligibility screenings are held every Tuesday and Wednesday afternoon from 1:00 pm until 4:00 pm. You do not need an appointment for the interview!  °[facility] [address], [place], [place] 336-631-2330   °[facility]  336-342-8273   °[facility]  336-703-3100   °[facility]  336-570-6415   ° °Behavioral Health Resources in the Community: °Intensive Outpatient Programs °Organization         Address  Phone  Notes  °[facility] [address], [place], [place] 336-878-6098   °[facility] [address], [place], [place] 336-832-9800   °[facility] [address], [place], [place] ° 336-882-2125   °[facility] [address],  °[place], [place] 1-800-853-5163 or 336-641-4981   °Substance Abuse Resources °Organization         Address  Phone  Notes  °[facility]  336-882-2125   °[facility]  336-784-9470   °[facility]  336-285-9073   °[facility]  336-845-3988   °Residential & Outpatient Substance Abuse Program  1-800-659-3381   °Psychological Services °Organization         Address  Phone  Notes  °[facility]  336- 832-9600   °[facility]  336- 378-7881   °[facility] [address], [place] 1-800-853-5163 or 336-641-4981   ° °Mobile Crisis Teams °Organization         Address  Phone  Notes  °[facility], Mobile Crisis Care Unit  1-877-626-1772   °Assertive °[facility] ° [address]  [place], [place] 336-834-9664   °Sharon DeEsch [address] °[place] 336-554-5454   ° °Self-Help/Support Groups °Organization         Address  Phone             Notes  °[facility]  - variety of support groups  336- 373-1402 Call for more information  °Narcotics Anonymous (NA), [facility] [address], °[place]  2 meetings at this location  ° °  Residential Treatment Programs Organization         Address  Phone  Notes  [facility] [address],    [place]  1-610-960-4540   [facility]  [address], [place] [address], [place], [place] 478-295-6213   [facility] [address] [place], [place] 086-578-4696 Admissions: 8am-3pm M-F  [facility] [address],    [place], [place] 295-284-1324   [facility] [address] [place], [place], [place] 401-027-2536   [facility] [address],  [place], [place] 644-034-7425   [facility] Intensive Outpatient [address], [place], [place] 956-387-5643   [facility] ([facility] [address] [place].,  [place], [place] 3-295-188-4166 or (206) 150-3505   [facility] ([facility] [address], [place], [place] 323-557-3220 Accepts Medicaid  [facility] [address]  [place] 2-542-706-2376 Substance Abuse/Addiction Treatment   [facility]         Address  Phone  Notes  [facility]  239-218-6373   Angie Fava, PhD [address] [place], [place]   7827882144 or 573-658-8053   [facility]   [address] [place], [place] [phone]   [facility] [address], [place], [place] 520-795-9515 Insurance/Medicaid/sponsorship through [facility] [address]                                    [place], [place] (808) 620-9834 Therapy/tele-psych/case    [facility] [address], [place] (787) [phone]    Dr. Lolly Mustache  365-361-3639   [facility]  [employer] [facility] Dept. 1) [address] [place] 2) [address], [place] 3)  [address], [place] (564)[phone]  (617)[phone]   [facility] Child Abuse Hotline 202-244-8750 or (754)470-5382 (After Hours)      Resource guide provided to help you find help with the substance abuse. Behavioral Health felt that she did not meet criteria for detox admission.

## 2014-11-23 NOTE — ED Notes (Signed)
Pt's family member has been called and notified of pt's discharge; pt is refusing to leaven and states he needs to be treated for his "bed bugs"; pt informed that he has been treated already and he does not have any bugs; pt is encouraged to leave and security and charge RN in room, but refusing, [employer] called at this time

## 2014-11-23 NOTE — ED Notes (Signed)
[employer] at bedside with security and pt states he is getting dressed and is leaving; pt ambulatory to waiting room, waiting on his transportation to home

## 2014-11-23 NOTE — ED Notes (Signed)
Patient c/o chest pain that started 3 minutes ago. Sharp/ache to left chest right under nipple. EKG obtained and given to Dr Deretha Emory.

## 2014-11-23 NOTE — BH Assessment (Addendum)
Tele Assessment Note   Anthony Ferguson is an 64 y.o. male.  -Cliniician reviewed note by Anthony Ferguson.  Pt had wanted to detox from ETOH and cocaine.  Patient has been homeless since June '16.  He had stayed with daughter for a week.  For the last 3 months he has been staying with a friend.  Patient reports that he has bedbugs and has been bitten by them.  He swears he did not bring them to friend's home.  Patient cannot return to that residence due to he and friend having disagreements.  Pt says it was too loud over there anyway.  Patient denies any SI, HI or A/V hallucinations.  Pt has no plan or intention to kill himself or harm others.  He also reports no previous history of inpatient psychiatric care.  He did take DUI classes back in 2001.  Patient says that he uses both liquor and beer.  He reports drinking 1-2 beers in a day with an average of 1-2 days out of the week.  Liquor consumption is 1/2 a pint at a time for about 3 days out of the week.  Last use of ETOH was yesterday and patient is not reporting any detox symptoms at this time.  Patient also uses cocaine about once monthly with last use being today.  Pt admits to some depression.  No lethality voiced.  Patient appears to be disheveled.  He has no place to go at this time.  A friend brought him to [facility].  Patient does say he can follow up on SA referrals.    -Clinician discussed patient care with Anthony Sievert, PA.  He recommended outpatient resources for patient.  Clinician also spoke with Anthony Ferguson who was informed of disposition and is in agreement with it.  Clinician did fax outpatient SA resources to [facility] and informed secretary Anthony Ferguson that he was sending the resources.  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 Axis 5 GAF: 56  Past Medical History:  Past  Medical History  Diagnosis Date  . Myocardial infarct (HCC)   . Diabetes mellitus   . Hypertension   . Hypercholesteremia   . HIV (human immunodeficiency virus infection) [facility])     Past Surgical History  Procedure Laterality Date  . Cardiac surgery    . Circumcision      Family History:  Family History  Problem Relation Age of Onset  . Diabetes Mother   . Heart failure Mother     Social History:  reports that he has been smoking Cigarettes.  He has been smoking about 0.50 packs per day. He has never used smokeless tobacco. He reports that he drinks alcohol. He reports that he uses illicit drugs (Cocaine).  Additional Social History:  Alcohol / Drug Use Pain Medications: See PTA medication list Prescriptions: See PTA medication list Over the Counter: See PTA medication list History of alcohol / drug use?: Yes Substance #1 Name of Substance 1: ETOH  1 - Age of First Use: 65 years of age 34 - Amount (size/oz): 1/2 pint of liquor about three days out of the week; 2 twelve ounce beers for 1-2 days out of the week. 1 - Frequency: See above 1 - Duration: Last 3 months. 1 - Last Use / Amount: Last use was 10/02 Substance #2 Name of Substance  2: Cocaine (crack) 2 - Age of First Use: Around 64 years of age 27 - Amount (size/oz): $60-$70 worth of cocaine at a time 2 - Frequency: Once in a month 2 - Duration: Last three months 2 - Last Use / Amount: 10/03 < than 60-70 dollars worth  CIWA: CIWA-Ar BP: 175/98 mmHg Pulse Rate: 80 COWS:    PATIENT STRENGTHS: (choose at least two) Average or above average intelligence Communication skills  Allergies:  Allergies  Allergen Reactions  . Hctz [Hydrochlorothiazide] Other (See Comments)    "ran my sugar up"    Home Medications:  (Not in a hospital admission)  OB/GYN Status:  No LMP for male patient.  General Assessment Data [facility] of Assessment: [facility] T[facility] Assessment: In system Is this a Tele or Face-to-Face Assessment?:  Tele Assessment Is this an Initial Assessment or a Re-assessment for this encounter?: Initial Assessment Marital status: Separated Is patient pregnant?: No Pregnancy Status: No Living Arrangements: Other (Comment) (Homeless currently) Can pt return to current living arrangement?: Yes Admission Status: Voluntary Is patient capable of signing voluntary admission?: Yes Referral Source: Self/Family/Friend (A friend brought him to [facility].) Insurance type: [facility]     Crisis Care Plan Living Arrangements: Other (Comment) (Homeless currently) Name of Psychiatrist: None Name of Therapist: None  Education Status Is patient currently in school?: No Highest grade of school patient has completed: 9th grade  Risk to self with the past 6 months Suicidal Ideation: No Has patient been a risk to self within the past 6 months prior to admission? : No Suicidal Intent: No Has patient had any suicidal intent within the past 6 months prior to admission? : No Is patient at risk for suicide?: No Suicidal Plan?: No Has patient had any suicidal plan within the past 6 months prior to admission? : No Access to Means: No What has been your use of drugs/alcohol within the last 12 months?: ETOH & crack cocaine Previous Attempts/Gestures: No How many times?: 0 Other Self Harm Risks: None Triggers for Past Attempts: None known Intentional Self Injurious Behavior: None Family Suicide History: No Recent stressful life event(s): Financial Problems, Conflict (Comment), Other (Comment) (Homelessness, separation from wife) Persecutory voices/beliefs?: Yes Depression: Yes Depression Symptoms: Despondent, Feeling worthless/self pity Substance abuse history and/or treatment for substance abuse?: Yes Suicide prevention information given to non-admitted patients: Not applicable  Risk to Others within the past 6 months Homicidal Ideation: No Does patient have any lifetime risk of violence toward others beyond  the six months prior to admission? : No Thoughts of Harm to Others: No Current Homicidal Intent: No Current Homicidal Plan: No Access to Homicidal Means: No Identified Victim: No one History of harm to others?: Yes Assessment of Violence: In distant past Violent Behavior Description: When he was much younger Does patient have access to weapons?: No Criminal Charges Pending?: No Does patient have a court date: No Is patient on probation?: No  Psychosis Hallucinations: None noted Delusions: None noted  Mental Status Report Appearance/Hygiene: Disheveled Eye Contact: Good Motor Activity: Freedom of movement, Unremarkable Speech: Logical/coherent Level of Consciousness: Quiet/awake Mood: Depressed, Helpless Affect: Blunted, Depressed Anxiety Level: None Thought Processes: Coherent, Relevant Judgement: Unimpaired Orientation: Place, Person, Time, Situation Obsessive Compulsive Thoughts/Behaviors: None  Cognitive Functioning Concentration: Normal Memory: Recent Intact, Remote Intact IQ: Average Insight: Fair Impulse Control: Good Appetite: Good Weight Loss: 0 Weight Gain: 0 Sleep: No Change Total Hours of Sleep: 6 Vegetative Symptoms: None  ADLScreening [facility] Assessment Services) Patient's cognitive ability adequate  to safely complete daily activities?: Yes Patient able to express need for assistance with ADLs?: Yes Independently performs ADLs?: Yes (appropriate for developmental age)  Prior Inpatient Therapy Prior Inpatient Therapy: No Prior Therapy Dates: None Prior Therapy Facilty/Provider(s): None Reason for Treatment: None  Prior Outpatient Therapy Prior Outpatient Therapy: Yes Prior Therapy Dates: Back in 2001 Prior Therapy Facilty/Provider(s): DUI classes Reason for Treatment: DUI Does patient have an ACCT team?: No Does patient have Intensive In-House Services?  : No Does patient have [facility] services? : No Does patient have P4CC services?: No  ADL  Screening (condition at time of admission) Patient's cognitive ability adequate to safely complete daily activities?: Yes Is the patient deaf or have difficulty hearing?: No (None reported.) Does the patient have difficulty seeing, even when wearing glasses/contacts?: No (Pt wears glasses.) Does the patient have difficulty concentrating, remembering, or making decisions?: No Patient able to express need for assistance with ADLs?: Yes Does the patient have difficulty dressing or bathing?: No Independently performs ADLs?: Yes (appropriate for developmental age) Does the patient have difficulty walking or climbing stairs?: Yes (Cracked bone in right hip; rheumatoid arthritis) Weakness of Legs: Right Weakness of Arms/Hands: None  Home Assistive Devices/Equipment Home Assistive Devices/Equipment: None    Abuse/Neglect Assessment (Assessment to be complete while patient is alone) Physical Abuse: Denies Verbal Abuse: Denies Sexual Abuse: Denies Exploitation of patient/patient's resources: Denies Self-Neglect: Denies     Merchant navy officer (For Healthcare) Does patient have an advance directive?: No Would patient like information on creating an advanced directive?: No - patient declined information    Additional Information 1:1 In Past 12 Months?: No CIRT Risk: No Elopement Risk: No Does patient have medical clearance?: Yes     Disposition:  Disposition Initial Assessment Completed for this Encounter: Yes Disposition of Patient: Other dispositions Other disposition(s): Referred to outside facility (To be run by PA)  Anthony Ferguson 11/23/2014 8:07 PM

## 2014-11-23 NOTE — ED Notes (Addendum)
Patient complaining of generalized rash and itching since August after staying at someone's house. States "I didn't check the bed but I think it had bugs in it." Patient admits to using cocaine about 1 hour ago. During triage assessment, patient states he wants detox from cocaine and alcohol.

## 2014-11-23 NOTE — ED Notes (Signed)
Pt's daughter here to pick up pt and requesting to speak with pt's nurse; this nurse went out to speak with pt and daughter. Daughter wanting to know what was done for pt, informed pt and daughter that he would need to follow up with his primary care physician and to call the places listed in his discharge paperwork for detox help

## 2014-11-23 NOTE — ED Provider Notes (Addendum)
CSN: [id number]     Arrival date & time 11/23/14  1551 History   First MD Initiated Contact with Patient 11/23/14 1611     Chief Complaint  Patient presents with  . Rash  . Detox      (Consider location/radiation/quality/duration/timing/severity/associated sxs/prior Treatment) Patient is a 64 y.o. male presenting with rash. The history is provided by the patient.  Rash Associated symptoms: no abdominal pain, no fever, no headaches and no shortness of breath    the patient here for detox from cocaine and alcohol. Patient feels that he still having problems with bedbugs. Patient was seen here and treated for that in the past. Patient talks about some generalized itching. Patient denies any bedbugs being on his skin. Patient states that he last had alcohol a day ago. And he missed using cocaine and hour ago. Patient denies any suicidal or homicidal ideation.  Past Medical History  Diagnosis Date  . Myocardial infarct (HCC)   . Diabetes mellitus   . Hypertension   . Hypercholesteremia   . HIV (human immunodeficiency virus infection) [facility])    Past Surgical History  Procedure Laterality Date  . Cardiac surgery    . Circumcision     Family History  Problem Relation Age of Onset  . Diabetes Mother   . Heart failure Mother    Social History  Substance Use Topics  . Smoking status: Current Every Day Smoker -- 0.50 packs/day    Types: Cigarettes  . Smokeless tobacco: Never Used     Comment: Trying to slow down  . Alcohol Use: Yes     Comment: occas    Review of Systems  Constitutional: Negative for fever.  HENT: Negative for congestion.   Eyes: Negative for visual disturbance.  Respiratory: Negative for shortness of breath.   Cardiovascular: Negative for chest pain.  Gastrointestinal: Negative for abdominal pain.  Genitourinary: Negative for dysuria.  Musculoskeletal: Negative for back pain.  Skin: Positive for wound. Negative for rash.  Neurological: Negative for  headaches.  Hematological: Does not bruise/bleed easily.  Psychiatric/Behavioral: Negative for suicidal ideas, hallucinations and confusion.      Allergies  Hctz  Home Medications   Prior to Admission medications   Medication Sig Start Date End Date Taking? Authorizing Provider  aspirin 325 MG tablet Take 325 mg by mouth daily.     Yes Historical Provider, MD  glipiZIDE (GLUCOTROL XL) 10 MG 24 hr tablet TAKE 1 TABLET BY MOUTH DAILY Patient taking differently: TAKE 1/2 TABLET BY MOUTH DAILY 01/13/14  Yes Cliffton Asters, MD  lamiVUDine-zidovudine (COMBIVIR) 150-300 MG per tablet Take 1 tablet by mouth 2 (two) times daily. 04/24/14  Yes Gardiner Barefoot, MD  metoprolol (LOPRESSOR) 50 MG tablet TAKE 1 TABLET BY MOUTH TWICE DAILY FOR BLOOD PRESSURE. 11/19/14  Yes Cliffton Asters, MD  neomycin-bacitracin-polymyxin (NEOSPORIN) ointment Apply 1 application topically as needed (for rash). apply to eye   Yes Historical Provider, MD  nevirapine (VIRAMUNE) 200 MG tablet Take 1 tablet (200 mg total) by mouth 2 (two) times daily. 04/24/14  Yes Gardiner Barefoot, MD  pravastatin (PRAVACHOL) 20 MG tablet Take 1 tablet (20 mg total) by mouth daily. 04/13/14  Yes Cliffton Asters, MD  verapamil (VERELAN PM) 200 MG 24 hr capsule Take 200 mg by mouth daily.   06/02/10  Yes Lina Sayre, MD  diphenhydrAMINE (BENADRYL) 25 mg capsule Take 1 capsule (25 mg total) by mouth every 6 (six) hours as needed. Patient not taking: Reported on 11/23/2014 11/02/14  Tammy Triplett, PA-C  hydrochlorothiazide (HYDRODIURIL) 25 MG tablet Take 1 tablet (25 mg total) by mouth daily. for blood pressure Patient not taking: Reported on 11/02/2014 04/13/14   Cliffton Asters, MD  triamcinolone cream (KENALOG) 0.1 % Apply 1 application topically 3 (three) times daily. Patient not taking: Reported on 11/23/2014 11/02/14   Tammy Triplett, PA-C   BP 183/122 mmHg  Pulse 118  Temp(Src) 98.1 F (36.7 C) (Oral)  Resp 18  Ht  (1.905 m)  Wt 165 lb (74.844  kg)  BMI 20.62 kg/m2  SpO2 100% Physical Exam  Constitutional: He is oriented to person, place, and time. He appears well-developed and well-nourished. No distress.  HENT:  Head: Normocephalic and atraumatic.  Mouth/Throat: Oropharynx is clear and moist.  Eyes: Conjunctivae and EOM are normal. Pupils are equal, round, and reactive to light.  Neck: Normal range of motion.  Cardiovascular: Normal rate, regular rhythm and normal heart sounds.   No murmur heard. Pulmonary/Chest: Effort normal and breath sounds normal. No respiratory distress.  Abdominal: Soft. Bowel sounds are normal. There is no tenderness.  Musculoskeletal: Normal range of motion.  Neurological: He is alert and oriented to person, place, and time. No cranial nerve deficit. He exhibits normal muscle tone. Coordination normal.  Skin: Skin is warm. No rash noted.  Patient has some abrasions on his hands and upper extremities. No evidence of infection.  Nursing note and vitals reviewed.   ED Course  Procedures (including critical care time) Labs Review Labs Reviewed  CBC WITH DIFFERENTIAL/PLATELET - Abnormal; Notable for the following:    RBC 3.35 (*)    HCT 37.9 (*)    MCV 113.1 (*)    MCH 40.9 (*)    MCHC 36.1 (*)    Platelets 101 (*)    All other components within normal limits  COMPREHENSIVE METABOLIC PANEL - Abnormal; Notable for the following:    Glucose, Bld 132 (*)    AST 78 (*)    Total Bilirubin 1.6 (*)    All other components within normal limits  ETHANOL - Abnormal; Notable for the following:    Alcohol, Ethyl (B) 7 (*)    All other components within normal limits  URINE RAPID DRUG SCREEN, HOSP PERFORMED - Abnormal; Notable for the following:    Cocaine POSITIVE (*)    Tetrahydrocannabinol POSITIVE (*)    All other components within normal limits  LIPASE, BLOOD   Results for orders placed or performed during the hospital encounter of 11/23/14  CBC with Differential/Platelet  Result Value Ref  Range   WBC 4.0 4.0 - 10.5 K/uL   RBC 3.35 (L) 4.22 - 5.81 MIL/uL   Hemoglobin 13.7 13.0 - 17.0 g/dL   HCT 78.2 (L) 95.6 - 21.3 %   MCV 113.1 (H) 78.0 - 100.0 fL   MCH 40.9 (H) 26.0 - 34.0 pg   MCHC 36.1 (H) 30.0 - 36.0 g/dL   RDW 08.6 57.8 - 46.9 %   Platelets 101 (L) 150 - 400 K/uL   Neutrophils Relative % 56 %   Neutro Abs 2.2 1.7 - 7.7 K/uL   Lymphocytes Relative 32 %   Lymphs Abs 1.3 0.7 - 4.0 K/uL   Monocytes Relative 11 %   Monocytes Absolute 0.4 0.1 - 1.0 K/uL   Eosinophils Relative 1 %   Eosinophils Absolute 0.0 0.0 - 0.7 K/uL   Basophils Relative 0 %   Basophils Absolute 0.0 0.0 - 0.1 K/uL  Comprehensive metabolic panel  Result Value  Ref Range   Sodium 141 135 - 145 mmol/L   Potassium 4.5 3.5 - 5.1 mmol/L   Chloride 104 101 - 111 mmol/L   CO2 28 22 - 32 mmol/L   Glucose, Bld 132 (H) 65 - 99 mg/dL   BUN 18 6 - 20 mg/dL   Creatinine, Ser 1.61 0.61 - 1.24 mg/dL   Calcium 9.6 8.9 - 09.6 mg/dL   Total Protein 7.6 6.5 - 8.1 g/dL   Albumin 3.9 3.5 - 5.0 g/dL   AST 78 (H) 15 - 41 U/L   ALT 60 17 - 63 U/L   Alkaline Phosphatase 96 38 - 126 U/L   Total Bilirubin 1.6 (H) 0.3 - 1.2 mg/dL   GFR calc non Af Amer >60 >60 mL/min   GFR calc Af Amer >60 >60 mL/min   Anion gap 9 5 - 15  Ethanol  Result Value Ref Range   Alcohol, Ethyl (B) 7 (H) <5 mg/dL  Urine rapid drug screen (hosp performed)  Result Value Ref Range   Opiates NONE DETECTED NONE DETECTED   Cocaine POSITIVE (A) NONE DETECTED   Benzodiazepines NONE DETECTED NONE DETECTED   Amphetamines NONE DETECTED NONE DETECTED   Tetrahydrocannabinol POSITIVE (A) NONE DETECTED   Barbiturates NONE DETECTED NONE DETECTED  Lipase, blood  Result Value Ref Range   Lipase 23 22 - 51 U/L     Imaging Review No results found. I have personally reviewed and evaluated these images and lab results as part of my medical decision-making.   EKG Interpretation   Date/Time:  Monday November 23 2014 17:50:49 EDT Ventricular Rate:   75 PR Interval:  144 QRS Duration: 122 QT Interval:  448 QTC Calculation: 500 R Axis:   -123 Text Interpretation:  Sinus rhythm with Premature atrial complexes  Possible Left atrial enlargement Right bundle branch block Possible  Lateral infarct , age undetermined Inferior infarct , age undetermined  Abnormal ECG No significant change since last tracing Confirmed by  Chou Busler  MD, Sophy Mesler [phone]) on 11/23/2014 5:57:30 PM      MDM   Final diagnoses:  Alcohol abuse  Cocaine abuse    Patient medically cleared. Patient denies any suicidal or homicidal ideation. Patient requesting detox from cocaine and alcohol. Patient's alcohol level is not elevated significantly. Patient will be evaluated by behavioral health. Disposition based on their consultation.  The patient was seen in the past with concerns for bedbugs. Patient was treated for that. Patient commenting tonight he thinks he still has those. Is no evidence of any bedbugs on him here. Patient cleared medically for behavioral health evaluation.wear  Vanetta Mulders, MD 11/23/14 1942  Vanetta Mulders, MD 11/23/14 1942  Patient evaluated by behavioral health did not meet criteria for inpatient detox. Patient we discharged home with resource guide and community resources to follow-up with.  Vanetta Mulders, MD 11/23/14 2139

## 2015-01-19 ENCOUNTER — Other Ambulatory Visit: Payer: Self-pay | Admitting: Internal Medicine

## 2015-01-19 DIAGNOSIS — I10 Essential (primary) hypertension: Secondary | ICD-10-CM

## 2015-01-19 DIAGNOSIS — E119 Type 2 diabetes mellitus without complications: Secondary | ICD-10-CM

## 2015-01-19 NOTE — Telephone Encounter (Signed)
Please remind the patient of his appointment on Monday, December 12 at 3:00 PM.  Thank you.

## 2015-02-01 ENCOUNTER — Encounter: Payer: Self-pay | Admitting: Internal Medicine

## 2015-02-01 ENCOUNTER — Other Ambulatory Visit: Payer: Self-pay | Admitting: Internal Medicine

## 2015-02-01 ENCOUNTER — Ambulatory Visit (INDEPENDENT_AMBULATORY_CARE_PROVIDER_SITE_OTHER): Payer: Medicare HMO | Admitting: Internal Medicine

## 2015-02-01 VITALS — BP 132/83 | HR 67 | Temp 98.3°F | Wt 161.0 lb

## 2015-02-01 DIAGNOSIS — B20 Human immunodeficiency virus [HIV] disease: Secondary | ICD-10-CM

## 2015-02-01 DIAGNOSIS — F17200 Nicotine dependence, unspecified, uncomplicated: Secondary | ICD-10-CM

## 2015-02-01 DIAGNOSIS — B182 Chronic viral hepatitis C: Secondary | ICD-10-CM | POA: Diagnosis not present

## 2015-02-01 DIAGNOSIS — Z23 Encounter for immunization: Secondary | ICD-10-CM | POA: Diagnosis not present

## 2015-02-01 LAB — COMPREHENSIVE METABOLIC PANEL
ALBUMIN: 3 g/dL — AB (ref 3.6–5.1)
ALK PHOS: 111 U/L (ref 40–115)
ALT: 68 U/L — AB (ref 9–46)
AST: 114 U/L — ABNORMAL HIGH (ref 10–35)
BUN: 10 mg/dL (ref 7–25)
CHLORIDE: 105 mmol/L (ref 98–110)
CO2: 28 mmol/L (ref 20–31)
Calcium: 8.8 mg/dL (ref 8.6–10.3)
Creat: 0.62 mg/dL — ABNORMAL LOW (ref 0.70–1.25)
Glucose, Bld: 122 mg/dL — ABNORMAL HIGH (ref 65–99)
POTASSIUM: 4.6 mmol/L (ref 3.5–5.3)
Sodium: 140 mmol/L (ref 135–146)
TOTAL PROTEIN: 7.1 g/dL (ref 6.1–8.1)
Total Bilirubin: 0.8 mg/dL (ref 0.2–1.2)

## 2015-02-01 NOTE — Assessment & Plan Note (Signed)
I will recheck his hepatitis C viral load and a fibro-sure assay and see him back in one month to consider starting Harvoni.

## 2015-02-01 NOTE — Progress Notes (Signed)
Patient Active Problem List   Diagnosis Date Noted  . HIV INFECTION 03/03/2006    Priority: High  . Chronic hepatitis C virus infection (HCC) 03/03/2006    Priority: Medium  . DIABETES MELLITUS 03/03/2006  . TOBACCO USER 03/03/2006  . CORONARY ARTERY DISEASE 03/03/2006    Patient's Medications  New Prescriptions   No medications on file  Previous Medications   ASPIRIN 325 MG TABLET    Take 325 mg by mouth daily.     DIPHENHYDRAMINE (BENADRYL) 25 MG CAPSULE    Take 1 capsule (25 mg total) by mouth every 6 (six) hours as needed.   GLIPIZIDE (GLUCOTROL XL) 10 MG 24 HR TABLET    TAKE 1 TABLET BY MOUTH ONCE DAILY.   HYDROCHLOROTHIAZIDE (HYDRODIURIL) 25 MG TABLET    Take 1 tablet (25 mg total) by mouth daily. for blood pressure   LAMIVUDINE-ZIDOVUDINE (COMBIVIR) 150-300 MG PER TABLET    Take 1 tablet by mouth 2 (two) times daily.   METOPROLOL (LOPRESSOR) 50 MG TABLET    TAKE 1 TABLET BY MOUTH TWICE DAILY FOR BLOOD PRESSURE.   NEOMYCIN-BACITRACIN-POLYMYXIN (NEOSPORIN) OINTMENT    Apply 1 application topically as needed (for rash). apply to eye   NEVIRAPINE (VIRAMUNE) 200 MG TABLET    Take 1 tablet (200 mg total) by mouth 2 (two) times daily.   PRAVASTATIN (PRAVACHOL) 20 MG TABLET    Take 1 tablet (20 mg total) by mouth daily.   TRIAMCINOLONE CREAM (KENALOG) 0.1 %    Apply 1 application topically 3 (three) times daily.   VERAPAMIL (VERELAN PM) 200 MG 24 HR CAPSULE    Take 200 mg by mouth daily.    Modified Medications   No medications on file  Discontinued Medications   No medications on file    Subjective: Anthony Ferguson is in for his routine HIV follow-up visit. He was seen in the emergency department 2 months ago with bedbug bites. He also had a positive urine drug screen for cocaine and marijuana and undetectable alcohol level. He states that he went to live with an old friend when his apartment became infested with bed bugs and did not realize that his friend was into alcohol  and drugs. His daughter helped him about letting him stay with her for a while and he recently moved into a new apartment by himself. He has not had any drugs or alcohol since that time. Sounds like he is taking generic Combivir and Viramune. He takes both twice daily and has only missed one dose since his last visit. He continues to smoke cigarettes and has no current plan to quit. He says that he only smokes about 4 cigarettes daily. He has hepatitis C genotype 1A. I scheduled him for an ultrasound after his last visit 6 months ago but it was never completed.   Review of Systems: Review of Systems  Constitutional: Negative for fever, chills, weight loss, malaise/fatigue and diaphoresis.  HENT: Negative for sore throat.   Respiratory: Negative for cough, sputum production and shortness of breath.   Cardiovascular: Negative for chest pain.  Gastrointestinal: Negative for nausea, vomiting and diarrhea.  Genitourinary: Negative for dysuria and frequency.  Musculoskeletal: Negative for myalgias and joint pain.  Skin: Negative for rash.  Neurological: Negative for focal weakness.  Psychiatric/Behavioral: Negative for depression and substance abuse. The patient is not nervous/anxious.     Past Medical History  Diagnosis Date  . Myocardial infarct (HCC)   .  Diabetes mellitus   . Hypertension   . Hypercholesteremia   . HIV (human immunodeficiency virus infection) (HCC)     Social History  Substance Use Topics  . Smoking status: Current Every Day Smoker -- 0.20 packs/day    Types: Cigarettes  . Smokeless tobacco: Never Used     Comment: Trying to slow down  . Alcohol Use: 0.0 oz/week    0 Standard drinks or equivalent per week     Comment: occas    Family History  Problem Relation Age of Onset  . Diabetes Mother   . Heart failure Mother     Allergies  Allergen Reactions  . Hctz [Hydrochlorothiazide] Other (See Comments)    "ran my sugar up"    Objective:  Filed Vitals:    02/01/15 1527  BP: 132/83  Pulse: 67  Temp: 98.3 F (36.8 C)  TempSrc: Oral  Weight: 161 lb (73.029 kg)   Body mass index is 20.12 kg/(m^2).  Physical Exam  Constitutional: He is oriented to person, place, and time.  He is soft-spoken, pleasant and in no distress.  HENT:  Mouth/Throat: No oropharyngeal exudate.  Eyes: Conjunctivae are normal.  Cardiovascular: Normal rate and regular rhythm.   No murmur heard. Pulmonary/Chest: Breath sounds normal.  Abdominal: Soft. He exhibits no mass. There is no tenderness.  Musculoskeletal: Normal range of motion.  Neurological: He is alert and oriented to person, place, and time.  Skin: No rash noted.  He has some healing spots on his hands that he states were where he had had bedbug bites  Psychiatric: Mood and affect normal.    Lab Results Lab Results  Component Value Date   WBC 4.0 11/23/2014   HGB 13.7 11/23/2014   HCT 37.9* 11/23/2014   MCV 113.1* 11/23/2014   PLT 101* 11/23/2014    Lab Results  Component Value Date   CREATININE 0.90 11/23/2014   BUN 18 11/23/2014   NA 141 11/23/2014   K 4.5 11/23/2014   CL 104 11/23/2014   CO2 28 11/23/2014    Lab Results  Component Value Date   ALT 60 11/23/2014   AST 78* 11/23/2014   ALKPHOS 96 11/23/2014   BILITOT 1.6* 11/23/2014    Lab Results  Component Value Date   CHOL 196 04/23/2014   HDL 138 04/23/2014   LDLCALC 46 04/23/2014   TRIG 62 04/23/2014   CHOLHDL 1.4 04/23/2014    Lab Results HIV 1 RNA QUANT (copies/mL)  Date Value  09/30/2014 <20  04/23/2014 <20  05/20/2013 <20   CD4 T CELL ABS (/uL)  Date Value  09/30/2014 350*  04/23/2014 520  05/20/2013 280*      Problem List Items Addressed This Visit      High   HIV INFECTION    His HIV infection has been under very good control. I will continue his current regimen and repeat lab work today. I will see him back in one month. He may need to change his antiretroviral regimen if he starts on Harvoni because  of potential drug drug interactions with Viramune.      Relevant Orders   T-helper cell (CD4)- ([facility] clinic only)   HIV 1 RNA quant-no reflex-bld   CBC   Comprehensive metabolic panel     Medium   Chronic hepatitis C virus infection (HCC)    I will recheck his hepatitis C viral load and a fibro-sure assay and see him back in one month to consider starting Harvoni.  Relevant Orders   Liver Fibrosis Panel     Unprioritized   TOBACCO USER - Primary    I talked to him about the importance of cigarette cessation and encouraged him to consider quitting. He will follow-up in one month to discuss this further.           Anthony AstersJohn Magdeline Prange, MD [facility] [phone] pager   [phone] cell 02/01/2015, 3:52 PM

## 2015-02-01 NOTE — Assessment & Plan Note (Signed)
I talked to him about the importance of cigarette cessation and encouraged him to consider quitting. He will follow-up in one month to discuss this further.

## 2015-02-01 NOTE — Assessment & Plan Note (Signed)
His HIV infection has been under very good control. I will continue his current regimen and repeat lab work today. I will see him back in one month. He may need to change his antiretroviral regimen if he starts on Harvoni because of potential drug drug interactions with Viramune.

## 2015-02-02 LAB — CBC
HEMATOCRIT: 36.3 % — AB (ref 39.0–52.0)
HEMOGLOBIN: 12.6 g/dL — AB (ref 13.0–17.0)
MCH: 36.4 pg — AB (ref 26.0–34.0)
MCHC: 34.7 g/dL (ref 30.0–36.0)
MCV: 104.9 fL — AB (ref 78.0–100.0)
MPV: 10.6 fL (ref 8.6–12.4)
Platelets: 86 10*3/uL — ABNORMAL LOW (ref 150–400)
RBC: 3.46 MIL/uL — AB (ref 4.22–5.81)
RDW: 15.1 % (ref 11.5–15.5)
WBC: 1.7 10*3/uL — ABNORMAL LOW (ref 4.0–10.5)

## 2015-02-02 LAB — T-HELPER CELL (CD4) - (RCID CLINIC ONLY)
CD4 % Helper T Cell: 41 % (ref 33–55)
CD4 T Cell Abs: 360 /uL — ABNORMAL LOW (ref 400–2700)

## 2015-02-02 LAB — HIV-1 RNA QUANT-NO REFLEX-BLD: HIV 1 RNA Quant: <20 copies/mL (ref <20)

## 2015-02-02 NOTE — Addendum Note (Signed)
Addended by: Nicholes CalamityPHAM, Keymani Glynn Q on: 02/02/2015 03:03 PM   Modules accepted: Orders

## 2015-02-03 LAB — HEPATITIS C RNA QUANTITATIVE
HCV QUANT LOG: 6.21 {Log} — AB (ref <1.18)
HCV Quantitative: 1632320 IU/mL — ABNORMAL HIGH (ref <15)

## 2015-02-04 LAB — LIVER FIBROSIS, FIBROTEST-ACTITEST
ALPHA-2-MACROGLOBULIN: 367 mg/dL — AB (ref 106–279)
ALT: 65 U/L — ABNORMAL HIGH (ref 9–46)
APOLIPOPROTEIN A1: 234 mg/dL — AB (ref 94–176)
Bilirubin: 0.7 mg/dL (ref 0.2–1.2)
FIBROSIS SCORE: 0.74
GGT: 213 U/L — ABNORMAL HIGH (ref 3–70)
Haptoglobin: 50 mg/dL (ref 43–212)
NECROINFLAMMAT ACT SCORE: 0.55
REFERENCE ID: 1390471

## 2015-02-23 ENCOUNTER — Emergency Department (HOSPITAL_COMMUNITY)
Admission: EM | Admit: 2015-02-23 | Discharge: 2015-02-23 | Disposition: A | Discharge status: Home / Self Care | Payer: Medicare HMO | Attending: Emergency Medicine | Admitting: Emergency Medicine

## 2015-02-23 ENCOUNTER — Encounter (HOSPITAL_COMMUNITY): Payer: Self-pay

## 2015-02-23 ENCOUNTER — Emergency Department (HOSPITAL_COMMUNITY): Payer: Medicare HMO

## 2015-02-23 DIAGNOSIS — L299 Pruritus, unspecified: Secondary | ICD-10-CM | POA: Diagnosis not present

## 2015-02-23 DIAGNOSIS — I10 Essential (primary) hypertension: Secondary | ICD-10-CM | POA: Diagnosis not present

## 2015-02-23 DIAGNOSIS — Y9289 Other specified places as the place of occurrence of the external cause: Secondary | ICD-10-CM | POA: Insufficient documentation

## 2015-02-23 DIAGNOSIS — Y9389 Activity, other specified: Secondary | ICD-10-CM | POA: Insufficient documentation

## 2015-02-23 DIAGNOSIS — F17210 Nicotine dependence, cigarettes, uncomplicated: Secondary | ICD-10-CM | POA: Insufficient documentation

## 2015-02-23 DIAGNOSIS — S3992XA Unspecified injury of lower back, initial encounter: Secondary | ICD-10-CM | POA: Diagnosis present

## 2015-02-23 DIAGNOSIS — B20 Human immunodeficiency virus [HIV] disease: Secondary | ICD-10-CM | POA: Insufficient documentation

## 2015-02-23 DIAGNOSIS — E119 Type 2 diabetes mellitus without complications: Secondary | ICD-10-CM | POA: Insufficient documentation

## 2015-02-23 DIAGNOSIS — W06XXXA Fall from bed, initial encounter: Secondary | ICD-10-CM | POA: Insufficient documentation

## 2015-02-23 DIAGNOSIS — Z79899 Other long term (current) drug therapy: Secondary | ICD-10-CM | POA: Diagnosis not present

## 2015-02-23 DIAGNOSIS — M545 Low back pain, unspecified: Secondary | ICD-10-CM

## 2015-02-23 DIAGNOSIS — I252 Old myocardial infarction: Secondary | ICD-10-CM | POA: Diagnosis not present

## 2015-02-23 DIAGNOSIS — Z7982 Long term (current) use of aspirin: Secondary | ICD-10-CM | POA: Diagnosis not present

## 2015-02-23 DIAGNOSIS — G8929 Other chronic pain: Secondary | ICD-10-CM | POA: Insufficient documentation

## 2015-02-23 DIAGNOSIS — E7800 Pure hypercholesterolemia, unspecified: Secondary | ICD-10-CM | POA: Insufficient documentation

## 2015-02-23 DIAGNOSIS — Y998 Other external cause status: Secondary | ICD-10-CM | POA: Diagnosis not present

## 2015-02-23 MED ORDER — HYDROCODONE-ACETAMINOPHEN 5-325 MG PO TABS
1.0000 | ORAL_TABLET | Freq: Once | ORAL | Status: AC
  Administered 2015-02-23: 1 via ORAL
  Filled 2015-02-23: qty 1

## 2015-02-23 NOTE — ED Provider Notes (Signed)
CSN: [id number]     Arrival date & time 02/23/15  0911 History  By signing my name below, I, Marica OtterNusrat Rahman, attest that this documentation has been [id number] under the direction and in the presence of No att. providers found. Electronically Signed: Marica OtterNusrat Rahman, ED Scribe. 02/23/2015. 9:46 AM.   Chief Complaint  Patient presents with  . Back Pain   The history is provided by the patient. No language interpreter was used.   PCP: Alice ReichertMCINNIS,ANGUS G, MD HPI Comments: Anthony Ferguson is a 65 y.o. male, with PMHx noted below including DM, HTN, HIV (well controlled on recent ID follow-up), who presents to the Emergency Department complaining of a fall sustained this morning after pt slipped out of his bed. Associated Sx include worsening, chronic lower back pain-- pt notes back pain at baseline due to herniated disc in lower back. Pt denies taking any measures at home to alleviate his Sx.   As a secondary matter, pt complains of ongoing, intermittent itching to his scalp onset 4 months ago after pt was exposed to bed bugs.   Pt confirms daily aspirin use. Pt denies fever, abd pain, n/v/d, dysuria, blood in urine, fecal or urinary incontinence, or urinary retention. No numbness or weakness.   Past Medical History  Diagnosis Date  . Myocardial infarct (HCC)   . Diabetes mellitus   . Hypertension   . Hypercholesteremia   . HIV (human immunodeficiency virus infection) [facility](HCC)    Past Surgical History  Procedure Laterality Date  . Cardiac surgery    . Circumcision     Family History  Problem Relation Age of Onset  . Diabetes Mother   . Heart failure Mother    Social History  Substance Use Topics  . Smoking status: Current Every Day Smoker -- 0.20 packs/day    Types: Cigarettes  . Smokeless tobacco: Never Used     Comment: Trying to slow down  . Alcohol Use: 0.0 oz/week    0 Standard drinks or equivalent per week     Comment: occas    Review of Systems  Constitutional: Negative for  fever.  Gastrointestinal: Negative for nausea, vomiting, abdominal pain and diarrhea.  Genitourinary: Negative for dysuria and difficulty urinating.  Musculoskeletal: Positive for back pain.  Skin:       Itching to scalp   Hematological: Bruises/bleeds easily.  All other systems reviewed and are negative.  Allergies  Hctz  Home Medications   Prior to Admission medications   Medication Sig Start Date End Date Taking? Authorizing Provider  aspirin 325 MG tablet Take 325 mg by mouth daily.     Yes Historical Provider, MD  glipiZIDE (GLUCOTROL XL) 10 MG 24 hr tablet TAKE 1 TABLET BY MOUTH ONCE DAILY. 01/19/15  Yes Cliffton AstersJohn Campbell, MD  lamiVUDine-zidovudine (COMBIVIR) 150-300 MG per tablet Take 1 tablet by mouth 2 (two) times daily. 04/24/14  Yes Gardiner Barefootobert W Comer, MD  metoprolol (LOPRESSOR) 50 MG tablet TAKE 1 TABLET BY MOUTH TWICE DAILY FOR BLOOD PRESSURE. 01/19/15  Yes Cliffton AstersJohn Campbell, MD  neomycin-bacitracin-polymyxin (NEOSPORIN) ointment Apply 1 application topically as needed (for rash). apply to eye   Yes Historical Provider, MD  nevirapine (VIRAMUNE) 200 MG tablet Take 1 tablet (200 mg total) by mouth 2 (two) times daily. 04/24/14  Yes Gardiner Barefootobert W Comer, MD  pravastatin (PRAVACHOL) 20 MG tablet Take 1 tablet (20 mg total) by mouth daily. 04/13/14  Yes Cliffton AstersJohn Campbell, MD  verapamil (VERELAN PM) 200 MG 24 hr capsule Take 200 mg  by mouth daily.   06/02/10  Yes Lina Sayre, MD   Triage Vitals: BP 156/95 mmHg  Pulse 86  Temp(Src) 98.1 F (36.7 C) (Oral)  Resp 12  Ht 6\' 3"  (1.905 m)  Wt 175 lb (79.379 kg)  BMI 21.87 kg/m2  SpO2 100% Physical Exam  Constitutional: He is oriented to person, place, and time. He appears well-developed and well-nourished.  HENT:  Head: Normocephalic.  Eyes: EOM are normal.  Neck: Normal range of motion.  Pulmonary/Chest: Effort normal.  Abdominal: He exhibits no distension.  Musculoskeletal: Normal range of motion.       Lumbar back: He exhibits tenderness.   Lower lumbar and paraspinal tenderness. No step offs, no deformities. +2 DP pulses.   Neurological: He is alert and oriented to person, place, and time.  Full sensation to light touch throughout. Full strength in bilateral hips; full strength in flexion and extension, ankle dorsi flexion, and plantar flexion.    Psychiatric: He has a normal mood and affect.  Nursing note and vitals reviewed.   ED Course  Procedures (including critical care time) DIAGNOSTIC STUDIES: Oxygen Saturation is 100% on ra, nl by my interpretation.    COORDINATION OF CARE: 9:32 AM: Discussed treatment plan which includes imaging and meds with pt at bedside; patient verbalizes understanding and agrees with treatment plan.  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 Normal alignment. No fracture. IMPRESSION: No acute bony abnormality.  Diffuse degenerative changes. Electronically Signed   By: Charlett Nose M.D.   On: 02/23/2015 10:16   I have personally reviewed and evaluated these images as part of my medical decision-making.  MDM   Final diagnoses:  Bilateral low back pain without sciatica    65 year old male with history of HTN, HIV, and DM who presents with acute on chronic back pain after slipping off bed this morning. On arrival, in no acute distress. VS stable. Neuro exam in tact. No blood thinners and no infectious symptoms. Do not suspect spinal cord compression or serious injury involving back. XR performed of lumbar spine, with degenerative changes. Able to ambulate in the ED. Do not suspect serious back pathology or back injury.   Scalp examined and with 2-3 lesions that is not suggestive of bug bite. Small raised macular scaly lesion. Not consistent with bacterial or viral infection or fungal  etiology. Ongoing for several months now. Will follow-up with his pcp and ID physicians. Strict return and follow-up instructions reviewed. He expressed understanding of all discharge instructions and felt comfortable with the plan of care.   I personally performed the services described in this documentation, which was scribed in my presence. The recorded information has been reviewed and is accurate.    Lavera Guise, MD 02/23/15 [phone]

## 2015-02-23 NOTE — ED Notes (Signed)
Ambulated without difficulty.

## 2015-02-23 NOTE — Discharge Instructions (Signed)
Return without fail for worsening symptoms, including worsening pain, weakness or numbness of the legs, fevers, or any other symptoms concerning to you.   Back Pain, Adult Back pain is very common. The pain often gets better over time. The cause of back pain is usually not dangerous. Most people can learn to manage their back pain on their own.  HOME CARE  Watch your back pain for any changes. The following actions may help to lessen any pain you are feeling:  Stay active. Start with short walks on flat ground if you can. Try to walk farther each day.  Exercise regularly as told by your doctor. Exercise helps your back heal faster. It also helps avoid future injury by keeping your muscles strong and flexible.  Do not sit, drive, or stand in one place for more than 30 minutes.  Do not stay in bed. Resting more than 1-2 days can slow down your recovery.  Be careful when you bend or lift an object. Use good form when lifting:  Bend at your knees.  Keep the object close to your body.  Do not twist.  Sleep on a firm mattress. Lie on your side, and bend your knees. If you lie on your back, put a pillow under your knees.  Take medicines only as told by your doctor.  Put ice on the injured area.  Put ice in a plastic bag.  Place a towel between your skin and the bag.  Leave the ice on for 20 minutes, 2-3 times a day for the first 2-3 days. After that, you can switch between ice and heat packs.  Avoid feeling anxious or stressed. Find good ways to deal with stress, such as exercise.  Maintain a healthy weight. Extra weight puts stress on your back. GET HELP IF:   You have pain that does not go away with rest or medicine.  You have worsening pain that goes down into your legs or buttocks.  You have pain that does not get better in one week.  You have pain at night.  You lose weight.  You have a fever or chills. GET HELP RIGHT AWAY IF:   You cannot control when you poop  (bowel movement) or pee (urinate).  Your arms or legs feel weak.  Your arms or legs lose feeling (numbness).  You feel sick to your stomach (nauseous) or throw up (vomit).  You have belly (abdominal) pain.  You feel like you may pass out (faint).   This information is not intended to replace advice given to you by your health care provider. Make sure you discuss any questions you have with your health care provider.   Document Released: 07/26/2007 Document Revised: 02/27/2014 Document Reviewed: 06/10/2013 Elsevier Interactive Patient Education [employer]2016 Elsevier Inc.

## 2015-02-23 NOTE — ED Notes (Signed)
Pt states he slipped out of the bed wrong and his back hurst. States he has slipped disk in his lower back. Pt also states he had scabies back in November and his  Head still itches at times

## 2015-03-09 ENCOUNTER — Ambulatory Visit: Payer: Medicare HMO | Admitting: Internal Medicine

## 2015-03-22 ENCOUNTER — Ambulatory Visit: Payer: Medicare HMO | Admitting: Internal Medicine

## 2015-03-25 ENCOUNTER — Other Ambulatory Visit: Payer: Self-pay | Admitting: Internal Medicine

## 2015-04-14 DIAGNOSIS — D649 Anemia, unspecified: Secondary | ICD-10-CM | POA: Insufficient documentation

## 2015-04-14 DIAGNOSIS — D696 Thrombocytopenia, unspecified: Secondary | ICD-10-CM | POA: Insufficient documentation

## 2015-04-14 DIAGNOSIS — F1410 Cocaine abuse, uncomplicated: Secondary | ICD-10-CM | POA: Insufficient documentation

## 2015-04-15 ENCOUNTER — Ambulatory Visit: Payer: Medicare HMO | Admitting: Internal Medicine

## 2015-05-13 ENCOUNTER — Other Ambulatory Visit: Payer: Self-pay | Admitting: Internal Medicine

## 2015-05-13 DIAGNOSIS — B20 Human immunodeficiency virus [HIV] disease: Secondary | ICD-10-CM

## 2015-06-07 ENCOUNTER — Other Ambulatory Visit: Payer: Self-pay | Admitting: Internal Medicine

## 2015-06-16 ENCOUNTER — Other Ambulatory Visit: Payer: Self-pay | Admitting: Internal Medicine

## 2015-06-16 DIAGNOSIS — B20 Human immunodeficiency virus [HIV] disease: Secondary | ICD-10-CM

## 2015-09-10 ENCOUNTER — Other Ambulatory Visit: Payer: Self-pay | Admitting: Internal Medicine

## 2015-09-10 DIAGNOSIS — B20 Human immunodeficiency virus [HIV] disease: Secondary | ICD-10-CM

## 2015-10-05 ENCOUNTER — Other Ambulatory Visit: Payer: Self-pay | Admitting: *Deleted

## 2015-10-05 DIAGNOSIS — I25110 Atherosclerotic heart disease of native coronary artery with unstable angina pectoris: Secondary | ICD-10-CM

## 2015-10-05 MED ORDER — PRAVASTATIN SODIUM 20 MG PO TABS: 20.0000 mg | ORAL_TABLET | Freq: Every day | ORAL | 1 refills | Status: DC

## 2015-10-18 ENCOUNTER — Other Ambulatory Visit: Payer: Self-pay | Admitting: *Deleted

## 2015-10-18 ENCOUNTER — Other Ambulatory Visit: Payer: Self-pay | Admitting: Internal Medicine

## 2015-10-18 DIAGNOSIS — B20 Human immunodeficiency virus [HIV] disease: Secondary | ICD-10-CM

## 2015-10-18 DIAGNOSIS — I25110 Atherosclerotic heart disease of native coronary artery with unstable angina pectoris: Secondary | ICD-10-CM

## 2015-10-18 MED ORDER — LAMIVUDINE-ZIDOVUDINE 150-300 MG PO TABS: 1.0000 | ORAL_TABLET | Freq: Two times a day (BID) | ORAL | 0 refills | Status: DC

## 2015-10-18 MED ORDER — NEVIRAPINE 200 MG PO TABS: 200.0000 mg | ORAL_TABLET | Freq: Two times a day (BID) | ORAL | 0 refills | Status: DC

## 2015-10-28 ENCOUNTER — Other Ambulatory Visit: Payer: Medicare HMO

## 2015-11-08 ENCOUNTER — Other Ambulatory Visit: Payer: Self-pay | Admitting: Internal Medicine

## 2015-11-08 DIAGNOSIS — I10 Essential (primary) hypertension: Secondary | ICD-10-CM

## 2015-11-16 ENCOUNTER — Ambulatory Visit: Payer: Medicare HMO | Admitting: Internal Medicine

## 2015-11-24 ENCOUNTER — Other Ambulatory Visit: Payer: Self-pay | Admitting: Internal Medicine

## 2015-11-24 DIAGNOSIS — B20 Human immunodeficiency virus [HIV] disease: Secondary | ICD-10-CM

## 2015-11-25 ENCOUNTER — Ambulatory Visit: Payer: Medicare HMO | Admitting: Internal Medicine

## 2015-12-20 ENCOUNTER — Ambulatory Visit (INDEPENDENT_AMBULATORY_CARE_PROVIDER_SITE_OTHER): Payer: Medicare HMO | Admitting: Internal Medicine

## 2015-12-20 ENCOUNTER — Encounter: Payer: Self-pay | Admitting: Internal Medicine

## 2015-12-20 DIAGNOSIS — B182 Chronic viral hepatitis C: Secondary | ICD-10-CM | POA: Diagnosis not present

## 2015-12-20 DIAGNOSIS — F1410 Cocaine abuse, uncomplicated: Secondary | ICD-10-CM | POA: Diagnosis not present

## 2015-12-20 DIAGNOSIS — B20 Human immunodeficiency virus [HIV] disease: Secondary | ICD-10-CM

## 2015-12-20 NOTE — Assessment & Plan Note (Signed)
He has had no stated drug use in the past year.

## 2015-12-20 NOTE — Assessment & Plan Note (Signed)
He has chronic hepatitis C infection and has never been treated. He is genotype 1A with a fibrosis score of F3/F4 and elevated liver enzymes. His viral load late last year was 1.6 million. He is a candidate for Harvoni therapy. I will repeat his viral load and see him back in 2 weeks.

## 2015-12-20 NOTE — Assessment & Plan Note (Signed)
We will try to help in locating new primary care provider in [place].

## 2015-12-20 NOTE — Assessment & Plan Note (Signed)
His HIV infection remains under excellent control as of December of last year. He is on a very old regimen but it is working well. I will check an HLA [id number] and see him back in 2 weeks. I will consider changing him to Fransico Meadow or a combination of Descovy and Tivicay.

## 2015-12-20 NOTE — Progress Notes (Signed)
Patient Active Problem List   Diagnosis Date Noted  . Human immunodeficiency virus (HIV) disease ([place]) 03/03/2006    Priority: High  . Chronic hepatitis C virus infection ([place]) 03/03/2006    Priority: Medium  . Cocaine abuse 04/14/2015  . Normocytic anemia 04/14/2015  . Thrombocytopenia ([place]) 04/14/2015  . Type 2 diabetes mellitus ([place]) 03/03/2006  . TOBACCO USER 03/03/2006  . CORONARY ARTERY DISEASE 03/03/2006    Patient's Medications  New Prescriptions   No medications on file  Previous Medications   ASPIRIN 325 MG TABLET    Take 325 mg by mouth daily.     GLIPIZIDE (GLUCOTROL XL) 10 MG 24 HR TABLET    TAKE 1 TABLET BY MOUTH ONCE DAILY.   LAMIVUDINE-ZIDOVUDINE (COMBIVIR) 150-300 MG TABLET    TAKE 1 TABLET BY MOUTH TWICE DAILY.   METOPROLOL (LOPRESSOR) 50 MG TABLET    TAKE 1 TABLET BY MOUTH TWICE DAILY FOR BLOOD PRESSURE.   NEOMYCIN-BACITRACIN-POLYMYXIN (NEOSPORIN) OINTMENT    Apply 1 application topically as needed (for rash). apply to eye   NEVIRAPINE (VIRAMUNE) 200 MG TABLET    TAKE 1 TABLET BY MOUTH TWICE DAILY.   PRAVASTATIN (PRAVACHOL) 20 MG TABLET    Take 1 tablet (20 mg total) by mouth daily.   VERAPAMIL (VERELAN PM) 200 MG 24 HR CAPSULE    Take 200 mg by mouth daily.    Modified Medications   No medications on file  Discontinued Medications   No medications on file    Subjective: Anthony Ferguson is in for his first visit since December of last year. He is currently living alone in an apartment in [place] and has difficulty with transportation to [place]. He has some chronic right hip and thigh pain since an injury in early 1981. Otherwise he is doing very well. He has had no problem obtaining his Combivir and Viramune and states he never misses doses. He has never been on any other therapy other than a brief trial of Atripla which caused hallucinations. He will occasionally drink alcohol in moderation. He has had no further cocaine or drug use since last year.  He does not have a primary care provider since Dr. Emilee Hero retired.  Review of Systems: Review of Systems  Constitutional: Negative for chills, diaphoresis, fever, malaise/fatigue and weight loss.  HENT: Negative for sore throat.   Respiratory: Negative for cough, sputum production and shortness of breath.   Cardiovascular: Negative for chest pain.  Gastrointestinal: Negative for abdominal pain, diarrhea, nausea and vomiting.  Genitourinary: Negative for dysuria.  Musculoskeletal: Positive for joint pain. Negative for myalgias.  Skin: Negative for rash.  Neurological: Negative for dizziness and headaches.  Psychiatric/Behavioral: Negative for depression and substance abuse. The patient is not nervous/anxious.     Past Medical History:  Diagnosis Date  . Diabetes mellitus   . HIV (human immunodeficiency virus infection) ([place])   . Hypercholesteremia   . Hypertension   . Myocardial infarct     Social History  Substance Use Topics  . Smoking status: Current Every Day Smoker    Packs/day: 0.30    Types: Cigarettes  . Smokeless tobacco: Never Used     Comment: Trying to slow down  . Alcohol use 3.0 oz/week    5 Cans of beer per week     Comment: occas    Family History  Problem Relation Age of Onset  . Diabetes Mother   . Heart failure Mother  Allergies  Allergen Reactions  . Hctz [Hydrochlorothiazide] Other (See Comments)    "ran my sugar up"    Objective:  Vitals:   12/20/15 1434 12/20/15 1442  BP: (!) 175/114 (!) 166/98  Pulse: 76 76  Temp: 97.5 F (36.4 C)   TempSrc: Oral   Weight: 170 lb 8 oz (77.3 kg)   Height: _0  (1.905 m)    Body mass index is 21.31 kg/m.  Physical Exam  Constitutional: He is oriented to person, place, and time.  He is well dressed and in good spirits today.  HENT:  Mouth/Throat: No oropharyngeal exudate.  He is edentulous  Eyes: Conjunctivae are normal.  Cardiovascular: Normal rate and regular rhythm.   No murmur  heard. Pulmonary/Chest: Effort normal and breath sounds normal. He has no wheezes. He has no rales.  Abdominal: Soft. He exhibits no mass. There is no tenderness.  Musculoskeletal: Normal range of motion. He exhibits no edema or tenderness.  Neurological: He is alert and oriented to person, place, and time.  Skin: No rash noted.  Psychiatric: Mood and affect normal.    Lab Results Lab Results  Component Value Date   WBC 1.7 (L) 02/01/2015   HGB 12.6 (L) 02/01/2015   HCT 36.3 (L) 02/01/2015   MCV 104.9 (H) 02/01/2015   PLT 86 (L) 02/01/2015    Lab Results  Component Value Date   CREATININE 0.62 (L) 02/01/2015   BUN 10 02/01/2015   NA 140 02/01/2015   K 4.6 02/01/2015   CL 105 02/01/2015   CO2 28 02/01/2015    Lab Results  Component Value Date   ALT 68 (H) 02/01/2015   ALT 65 (H) 02/01/2015   AST 114 (H) 02/01/2015   ALKPHOS 111 02/01/2015   BILITOT 0.8 02/01/2015    Lab Results  Component Value Date   CHOL 196 04/23/2014   HDL 138 04/23/2014   LDLCALC 46 04/23/2014   TRIG 62 04/23/2014   CHOLHDL 1.4 04/23/2014   HIV 1 RNA Quant (copies/mL)  Date Value  02/01/2015 <20  09/30/2014 <20  04/23/2014 <20   CD4 T Cell Abs (/uL)  Date Value  02/01/2015 360 (L)  09/30/2014 350 (L)  04/23/2014 520     Problem List Items Addressed This Visit      High   Human immunodeficiency virus (HIV) disease ([place])    His HIV infection remains under excellent control as of December of last year. He is on a very old regimen but it is working well. I will check an HLA [id number] and see him back in 2 weeks. I will consider changing him to Fransico Meadow or a combination of Descovy and Tivicay.        Medium   Chronic hepatitis C virus infection ([place])    He has chronic hepatitis C infection and has never been treated. He is genotype 1A with a fibrosis score of F3/F4 and elevated liver enzymes. His viral load late last year was 1.6 million. He is a candidate for Harvoni therapy. I  will repeat his viral load and see him back in 2 weeks.        Unprioritized   Cocaine abuse    He has had no stated drug use in the past year.       Other Visit Diagnoses   None.       Michel Bickers, MD [facility] [phone] pager   [phone] cell 12/20/2015, 3:11 PM

## 2015-12-21 LAB — COMPREHENSIVE METABOLIC PANEL
ALBUMIN: 3.9 g/dL (ref 3.6–5.1)
ALT: 38 U/L (ref 9–46)
AST: 50 U/L — ABNORMAL HIGH (ref 10–35)
Alkaline Phosphatase: 72 U/L (ref 40–115)
BUN: 14 mg/dL (ref 7–25)
CALCIUM: 9.4 mg/dL (ref 8.6–10.3)
CHLORIDE: 105 mmol/L (ref 98–110)
CO2: 24 mmol/L (ref 20–31)
Creat: 0.75 mg/dL (ref 0.70–1.25)
Glucose, Bld: 116 mg/dL — ABNORMAL HIGH (ref 65–99)
POTASSIUM: 4.4 mmol/L (ref 3.5–5.3)
Sodium: 139 mmol/L (ref 135–146)
TOTAL PROTEIN: 7.4 g/dL (ref 6.1–8.1)
Total Bilirubin: 1.1 mg/dL (ref 0.2–1.2)

## 2015-12-21 LAB — HIV-1 RNA QUANT-NO REFLEX-BLD: HIV-1 RNA Quant, Log: <1.3 Log copies/mL (ref <1.30)

## 2015-12-21 LAB — AFP TUMOR MARKER: AFP TUMOR MARKER: 6.2 ng/mL — AB (ref <6.1)

## 2015-12-21 LAB — CBC
HCT: 38.3 % — ABNORMAL LOW (ref 38.5–50.0)
HEMOGLOBIN: 13.7 g/dL (ref 13.2–17.1)
MCH: 39.9 pg — AB (ref 27.0–33.0)
MCHC: 35.8 g/dL (ref 32.0–36.0)
MCV: 111.7 fL — AB (ref 80.0–100.0)
MPV: 10.8 fL (ref 7.5–12.5)
Platelets: 114 10*3/uL — ABNORMAL LOW (ref 140–400)
RBC: 3.43 MIL/uL — AB (ref 4.20–5.80)
RDW: 14.4 % (ref 11.0–15.0)
WBC: 2.1 10*3/uL — ABNORMAL LOW (ref 3.8–10.8)

## 2015-12-21 LAB — LIPID PANEL
CHOL/HDL RATIO: 1.4 ratio (ref <=5.0)
CHOLESTEROL: 183 mg/dL (ref 125–200)
HDL: 130 mg/dL (ref >=40)
LDL Cholesterol: 37 mg/dL (ref <130)
TRIGLYCERIDES: 80 mg/dL (ref <150)
VLDL: 16 mg/dL (ref <30)

## 2015-12-21 LAB — RPR

## 2015-12-21 LAB — PROTIME-INR
INR: 1.1
Prothrombin Time: 11.7 s — ABNORMAL HIGH (ref 9.0–11.5)

## 2015-12-21 LAB — T-HELPER CELL (CD4) - (RCID CLINIC ONLY)
CD4 % Helper T Cell: 44 % (ref 33–55)
CD4 T Cell Abs: 550 /uL (ref 400–2700)

## 2015-12-22 LAB — HEPATITIS C RNA QUANTITATIVE
HCV Quantitative Log: 6.22 {Log} — ABNORMAL HIGH (ref <1.18)
HCV Quantitative: 1675295 IU/mL — ABNORMAL HIGH (ref <15)

## 2015-12-31 LAB — HLA B*5701: HLA-B*5701 w/rflx HLA-B High: NEGATIVE

## 2016-01-03 ENCOUNTER — Encounter: Payer: Self-pay | Admitting: Internal Medicine

## 2016-01-04 ENCOUNTER — Other Ambulatory Visit: Payer: Self-pay | Admitting: Internal Medicine

## 2016-01-04 DIAGNOSIS — B20 Human immunodeficiency virus [HIV] disease: Secondary | ICD-10-CM

## 2016-01-12 ENCOUNTER — Ambulatory Visit: Payer: Medicare HMO | Admitting: Gastroenterology

## 2016-01-20 ENCOUNTER — Other Ambulatory Visit: Payer: Self-pay | Admitting: Internal Medicine

## 2016-01-20 DIAGNOSIS — I25110 Atherosclerotic heart disease of native coronary artery with unstable angina pectoris: Secondary | ICD-10-CM

## 2016-01-20 DIAGNOSIS — I10 Essential (primary) hypertension: Secondary | ICD-10-CM

## 2016-02-17 ENCOUNTER — Other Ambulatory Visit: Payer: Self-pay | Admitting: *Deleted

## 2016-02-17 NOTE — Telephone Encounter (Signed)
Will need approval from Dr. Orvan Falconerampbell for rx for generic omega-3-capsule rx to be ordered.  Fax placed in Dr. Blair Dolphinampbell's box for review.

## 2016-02-18 ENCOUNTER — Telehealth: Payer: Self-pay | Admitting: *Deleted

## 2016-02-18 NOTE — Telephone Encounter (Signed)
Fax received from [facility] in [place], [place] requesting topical pain cream of lidicaine/prilocaine cream, 2.5%/2.5%, 720 gm for 90 days. This is not on the patient's medication list. Placed in Dr Blair Dolphinampbell's box. Patient is due for an appointment. Andree CossHowell, Montgomery Favor M, RN

## 2016-02-22 ENCOUNTER — Telehealth: Payer: Self-pay | Admitting: *Deleted

## 2016-02-22 DIAGNOSIS — Z0000 Encounter for general adult medical examination without abnormal findings: Secondary | ICD-10-CM

## 2016-02-22 NOTE — Telephone Encounter (Signed)
Please see if you can help him get a new primary care provider.

## 2016-02-22 NOTE — Telephone Encounter (Signed)
Referral for PCP entered per verbal order from Dr Orvan Falconerampbell. Andree CossHowell, Madolyn Ackroyd M, RN

## 2016-02-22 NOTE — Telephone Encounter (Signed)
Attempted to call for primary care referral in [place], left message with [facility].  Order entered for work queue.  Attempted to call patient, no answer and no voicemail. Andree CossHowell, Evalynne Locurto M, RN

## 2016-02-22 NOTE — Telephone Encounter (Signed)
Please see if you can help him get a new primary care provider. 

## 2016-02-23 ENCOUNTER — Telehealth: Payer: Self-pay | Admitting: *Deleted

## 2016-02-23 NOTE — Telephone Encounter (Signed)
Contacted [facility].  [facility] for primary care at this office after Dr Renard MatterMcInnis retired.   He is scheduled 03/27/16 at 1:00.  They will try to contact him, RN attempted to call him to inform him of the new appointment - no answer, no voicemail. PCP has the same topical pain cream prescription request that was faxed to Dr Orvan Falconerampbell. Dr Selena BattenKim will address this at the patient's appointment. Andree CossHowell, Amyah Clawson M, RN

## 2016-03-27 ENCOUNTER — Other Ambulatory Visit: Payer: Self-pay | Admitting: Internal Medicine

## 2016-03-27 DIAGNOSIS — I10 Essential (primary) hypertension: Secondary | ICD-10-CM

## 2016-04-05 NOTE — Telephone Encounter (Signed)
Patient's PCP is Dr. Pearson GrippeJames Kim.  He had a scheduled appt with Dr. Selena BattenKim for 03/27/16.  RN will route this message to Dr. Pearson GrippeJames Kim.

## 2016-04-25 ENCOUNTER — Ambulatory Visit: Payer: Medicare HMO

## 2016-04-27 ENCOUNTER — Encounter: Payer: Self-pay | Admitting: Internal Medicine

## 2016-07-23 ENCOUNTER — Encounter (HOSPITAL_COMMUNITY): Payer: Self-pay | Admitting: Emergency Medicine

## 2016-07-23 ENCOUNTER — Observation Stay (HOSPITAL_COMMUNITY)
Admission: EM | Admit: 2016-07-23 | Discharge: 2016-07-24 | Disposition: A | Discharge status: Home / Self Care | Payer: Medicare HMO | Attending: Family Medicine | Admitting: Family Medicine

## 2016-07-23 DIAGNOSIS — I4891 Unspecified atrial fibrillation: Secondary | ICD-10-CM | POA: Diagnosis not present

## 2016-07-23 DIAGNOSIS — D696 Thrombocytopenia, unspecified: Secondary | ICD-10-CM

## 2016-07-23 DIAGNOSIS — I25119 Atherosclerotic heart disease of native coronary artery with unspecified angina pectoris: Secondary | ICD-10-CM | POA: Diagnosis not present

## 2016-07-23 DIAGNOSIS — F1490 Cocaine use, unspecified, uncomplicated: Secondary | ICD-10-CM

## 2016-07-23 DIAGNOSIS — Z79899 Other long term (current) drug therapy: Secondary | ICD-10-CM | POA: Insufficient documentation

## 2016-07-23 DIAGNOSIS — R51 Headache: Secondary | ICD-10-CM | POA: Diagnosis not present

## 2016-07-23 DIAGNOSIS — R079 Chest pain, unspecified: Secondary | ICD-10-CM | POA: Diagnosis not present

## 2016-07-23 DIAGNOSIS — I2510 Atherosclerotic heart disease of native coronary artery without angina pectoris: Secondary | ICD-10-CM | POA: Insufficient documentation

## 2016-07-23 DIAGNOSIS — I10 Essential (primary) hypertension: Secondary | ICD-10-CM | POA: Insufficient documentation

## 2016-07-23 DIAGNOSIS — F17200 Nicotine dependence, unspecified, uncomplicated: Secondary | ICD-10-CM

## 2016-07-23 DIAGNOSIS — F1410 Cocaine abuse, uncomplicated: Secondary | ICD-10-CM

## 2016-07-23 DIAGNOSIS — R7989 Other specified abnormal findings of blood chemistry: Secondary | ICD-10-CM

## 2016-07-23 DIAGNOSIS — E785 Hyperlipidemia, unspecified: Secondary | ICD-10-CM | POA: Insufficient documentation

## 2016-07-23 DIAGNOSIS — Z7982 Long term (current) use of aspirin: Secondary | ICD-10-CM | POA: Diagnosis not present

## 2016-07-23 DIAGNOSIS — I252 Old myocardial infarction: Secondary | ICD-10-CM | POA: Insufficient documentation

## 2016-07-23 DIAGNOSIS — E119 Type 2 diabetes mellitus without complications: Secondary | ICD-10-CM | POA: Diagnosis not present

## 2016-07-23 DIAGNOSIS — Z7984 Long term (current) use of oral hypoglycemic drugs: Secondary | ICD-10-CM | POA: Diagnosis not present

## 2016-07-23 DIAGNOSIS — B20 Human immunodeficiency virus [HIV] disease: Principal | ICD-10-CM

## 2016-07-23 DIAGNOSIS — E118 Type 2 diabetes mellitus with unspecified complications: Secondary | ICD-10-CM | POA: Diagnosis not present

## 2016-07-23 DIAGNOSIS — R778 Other specified abnormalities of plasma proteins: Secondary | ICD-10-CM

## 2016-07-23 DIAGNOSIS — R748 Abnormal levels of other serum enzymes: Secondary | ICD-10-CM | POA: Diagnosis present

## 2016-07-23 DIAGNOSIS — S60521A Blister (nonthermal) of right hand, initial encounter: Secondary | ICD-10-CM | POA: Diagnosis not present

## 2016-07-23 HISTORY — DX: Type 2 diabetes mellitus without complications: E11.9

## 2016-07-23 LAB — BASIC METABOLIC PANEL
Anion gap: 13 (ref 5–15)
BUN: 25 mg/dL — AB (ref 6–20)
CALCIUM: 9.6 mg/dL (ref 8.9–10.3)
CO2: 23 mmol/L (ref 22–32)
Chloride: 101 mmol/L (ref 101–111)
Creatinine, Ser: 1.01 mg/dL (ref 0.61–1.24)
GFR calc Af Amer: >60 mL/min (ref >60)
GFR calc non Af Amer: >60 mL/min (ref >60)
GLUCOSE: 180 mg/dL — AB (ref 65–99)
Potassium: 3.5 mmol/L (ref 3.5–5.1)
SODIUM: 137 mmol/L (ref 135–145)

## 2016-07-23 LAB — CBC WITH DIFFERENTIAL/PLATELET
BASOS PCT: 0 %
Basophils Absolute: 0 10*3/uL (ref 0.0–0.1)
EOS ABS: 0.1 10*3/uL (ref 0.0–0.7)
Eosinophils Relative: 1 %
HCT: 36.2 % — ABNORMAL LOW (ref 39.0–52.0)
Hemoglobin: 13.1 g/dL (ref 13.0–17.0)
LYMPHS ABS: 1.5 10*3/uL (ref 0.7–4.0)
Lymphocytes Relative: 22 %
MCH: 40.2 pg — AB (ref 26.0–34.0)
MCHC: 36.2 g/dL — ABNORMAL HIGH (ref 30.0–36.0)
MCV: 111 fL — AB (ref 78.0–100.0)
MONO ABS: 1 10*3/uL (ref 0.1–1.0)
Monocytes Relative: 14 %
NEUTROS PCT: 63 %
Neutro Abs: 4.3 10*3/uL (ref 1.7–7.7)
PLATELETS: 89 10*3/uL — AB (ref 150–400)
RBC: 3.26 MIL/uL — AB (ref 4.22–5.81)
RDW: 13.1 % (ref 11.5–15.5)
WBC: 6.9 10*3/uL (ref 4.0–10.5)

## 2016-07-23 LAB — TROPONIN I
Troponin I: 0.17 ng/mL (ref <0.03)
Troponin I: 0.1 ng/mL (ref <0.03)

## 2016-07-23 LAB — I-STAT TROPONIN, ED
Troponin i, poc: 0.24 ng/mL (ref 0.00–0.08)
Troponin i, poc: 0.19 ng/mL (ref 0.00–0.08)

## 2016-07-23 LAB — ETHANOL: ALCOHOL ETHYL (B): 48 mg/dL — AB (ref <5)

## 2016-07-23 LAB — MRSA PCR SCREENING: MRSA BY PCR: NEGATIVE

## 2016-07-23 MED ORDER — ACETAMINOPHEN 325 MG PO TABS: 650.0000 mg | ORAL_TABLET | Freq: Four times a day (QID) | ORAL | Status: DC | PRN

## 2016-07-23 MED ORDER — APIXABAN 5 MG PO TABS
5.0000 mg | ORAL_TABLET | Freq: Two times a day (BID) | ORAL | Status: DC
  Administered 2016-07-23 – 2016-07-24 (×3): 5 mg via ORAL
  Filled 2016-07-23 (×5): qty 1

## 2016-07-23 MED ORDER — SODIUM CHLORIDE 0.9 % IV BOLUS (SEPSIS)
1000.0000 mL | Freq: Once | INTRAVENOUS | Status: AC
  Administered 2016-07-23: 1000 mL via INTRAVENOUS

## 2016-07-23 MED ORDER — GI COCKTAIL ~~LOC~~
30.0000 mL | Freq: Four times a day (QID) | ORAL | Status: DC | PRN
  Filled 2016-07-23: qty 30

## 2016-07-23 MED ORDER — ACETAMINOPHEN 650 MG RE SUPP: 650.0000 mg | Freq: Four times a day (QID) | RECTAL | Status: DC | PRN

## 2016-07-23 MED ORDER — FENTANYL CITRATE (PF) 100 MCG/2ML IJ SOLN
50.0000 ug | Freq: Once | INTRAMUSCULAR | Status: AC
  Administered 2016-07-23: 50 ug via INTRAVENOUS
  Filled 2016-07-23: qty 2

## 2016-07-23 MED ORDER — DILTIAZEM HCL 100 MG IV SOLR
5.0000 mg/h | Freq: Once | INTRAVENOUS | Status: AC
  Administered 2016-07-23: 5 mg/h via INTRAVENOUS
  Filled 2016-07-23: qty 100

## 2016-07-23 MED ORDER — ACETAMINOPHEN 325 MG PO TABS
650.0000 mg | ORAL_TABLET | ORAL | Status: DC | PRN
  Administered 2016-07-23 – 2016-07-24 (×2): 650 mg via ORAL
  Filled 2016-07-23 (×2): qty 2

## 2016-07-23 MED ORDER — ONDANSETRON HCL 4 MG/2ML IJ SOLN
4.0000 mg | Freq: Once | INTRAMUSCULAR | Status: AC
  Administered 2016-07-23: 4 mg via INTRAVENOUS
  Filled 2016-07-23: qty 2

## 2016-07-23 MED ORDER — ASPIRIN EC 81 MG PO TBEC
81.0000 mg | DELAYED_RELEASE_TABLET | Freq: Every day | ORAL | Status: DC
  Administered 2016-07-24: 81 mg via ORAL
  Filled 2016-07-23: qty 1

## 2016-07-23 MED ORDER — DILTIAZEM HCL 100 MG IV SOLR
5.0000 mg/h | INTRAVENOUS | Status: DC
  Filled 2016-07-23: qty 100

## 2016-07-23 MED ORDER — ONDANSETRON HCL 4 MG/2ML IJ SOLN: 4.0000 mg | Freq: Four times a day (QID) | INTRAMUSCULAR | Status: DC | PRN

## 2016-07-23 NOTE — Progress Notes (Signed)
Notified Dr. Rinaldo RatelKadolph of troponin of 0.17, troponin decreasing stay with treatment.

## 2016-07-23 NOTE — ED Notes (Signed)
CRITICAL VALUE ALERT  Critical Value:  Troponin  Date & Time Notied: 07/23/2016 0930  Provider Notified: Dr Adriana Simasook  Orders Received/Actions taken: none at this time

## 2016-07-23 NOTE — H&P (Signed)
History and Physical    Anthony Ferguson ZOX:[id number] DOB: 1950-07-04 DOA: 07/23/2016  PCP: Pearson Grippe, MD   Patient coming from: Home  Chief Complaint: Chest Pain and headache  HPI: Anthony Ferguson is a 66 y.o. male with medical history significant of HIV, HLD, HTN, MI and DM presents with concerns of headache and anterior chest pain.  Per patient these symptoms began after smoking cocaine yesterday.   Patient reports that he does not partake in cocaine use regularly but that he did yesterday.  After smoking cocaine his symptoms began and he states he told his friend.  He did not seek medical attention yesterday because he believed his symptoms would improve.  He still has a headache.  The chest pain is intermittent and feels like his heart is skipping a beat.  Headache feels as though he can't keep his eyes open in light.  Occasionally feels nauseous.  Denies diarrhea or constipation.  ED Course: Patient found to be in atrial fibrillation and initial troponin of >0.2.  Dr. Anne Fu of cardiology was called by EDP and did not feel the need for transfer to [facility] but that patient could be admitted to [facility] for cycling of enzymes and recommended avoidance of beta blockers and using diltiazem and eliquis for new atrial fibrillation.  Patient was started on cardizem drip.  TRH was asked to admit.  ETOH level at admission was 48.  Review of Systems: As per HPI otherwise 10 point review of systems negative.    Past Medical History:  Diagnosis Date  . Diabetes mellitus   . HIV (human immunodeficiency virus infection) (H[facility])   . Hypercholesteremia   . Hypertension   . Myocardial infarct [facility])     Past Surgical History:  Procedure Laterality Date  . CARDIAC SURGERY    . CIRCUMCISION       reports that he has been smoking Cigarettes.  He has been smoking about 0.30 packs per day. He has never used smokeless tobacco. He reports that he drinks about 3.0 oz of alcohol per week . He reports that  he does not use drugs.  Allergies  Allergen Reactions  . Hctz [Hydrochlorothiazide] Other (See Comments)    "ran my sugar up"  . Pork-Derived Products Palpitations and Hypertension    Family History  Problem Relation Age of Onset  . Diabetes Mother   . Heart failure Mother      Prior to Admission medications   Medication Sig Start Date End Date Taking? Authorizing Provider  aspirin 325 MG tablet Take 325 mg by mouth daily.     Yes [provider]  glipiZIDE (GLUCOTROL XL) 10 MG 24 hr tablet TAKE 1 TABLET BY MOUTH ONCE DAILY. 06/07/15  Yes Cliffton Asters, MD  hydrocortisone valerate cream (WESTCORT) 0.2 % Apply 1 application topically daily. 06/22/16  Yes [provider]  lamiVUDine-zidovudine (COMBIVIR) 150-300 MG tablet TAKE 1 TABLET BY MOUTH TWICE DAILY. 01/04/16  Yes Cliffton Asters, MD  lidocaine-prilocaine (EMLA) cream Apply 1 application topically daily. 06/19/16  Yes [provider]  metoprolol (LOPRESSOR) 50 MG tablet TAKE 1 TABLET BY MOUTH TWICE DAILY FOR BLOOD PRESSURE. 03/27/16  Yes Cliffton Asters, MD  neomycin-bacitracin-polymyxin (NEOSPORIN) ointment Apply 1 application topically as needed (for rash). apply to eye   Yes [provider]  nevirapine (VIRAMUNE) 200 MG tablet TAKE 1 TABLET BY MOUTH TWICE DAILY. 01/04/16  Yes Cliffton Asters, MD  pravastatin (PRAVACHOL) 20 MG tablet TAKE 1 TABLET BY MOUTH ONCE DAILY. 01/20/16  Yes Cliffton Astersampbell, John, MD  verapamil (VERELAN PM) 200 MG 24 hr capsule Take 200 mg by mouth daily.   06/02/10  Yes Lina SayreLane, Timothy, MD  omega-3 acid ethyl esters (LOVAZA) 1 g capsule Take 1 capsule by mouth 2 (two) times daily. 06/19/16   [provider]    Physical Exam: Vitals:   07/23/16 1045 07/23/16 1100 07/23/16 1130 07/23/16 1145  BP:  129/90 121/73   Pulse: (!) 102 (!) 113 90 (!) 101  Resp: (!) 22 (!) 22 20 19   Temp:      TempSrc:      SpO2: 97% 98% 99% 90%  Weight:      Height:          Constitutional:  NAD, calm, comfortable Vitals:   07/23/16 1045 07/23/16 1100 07/23/16 1130 07/23/16 1145  BP:  129/90 121/73   Pulse: (!) 102 (!) 113 90 (!) 101  Resp: (!) 22 (!) 22 20 19   Temp:      TempSrc:      SpO2: 97% 98% 99% 90%  Weight:      Height:       Eyes: PERRL, lids and conjunctivae normal ENMT: Mucous membranes are moist. Posterior pharynx clear of any exudate or lesions.Normal dentition.  Neck: normal, supple, no masses, no thyromegaly Respiratory: clear to auscultation bilaterally, no wheezing, no crackles. Normal respiratory effort. No accessory muscle use.  Cardiovascular: irregular irregular, no murmurs / rubs / gallops. No extremity edema. 2+ pedal pulses. No carotid bruits.  Abdomen: no tenderness, no masses palpated. No hepatosplenomegaly. Bowel sounds positive.  Musculoskeletal: no clubbing / cyanosis. No joint deformity upper and lower extremities. Good ROM, no contractures. Normal muscle tone.  Skin: no rashes, lesions, ulcers. No induration Neurologic: CN 2-12 grossly intact. Sensation intact, DTR normal. Strength 5/5 in all 4.  Psychiatric: Alert and oriented x 3. Normal mood.    Labs on Admission: I have personally reviewed following labs and imaging studies  CBC:  Recent Labs Lab 07/23/16 0851  WBC 6.9  NEUTROABS 4.3  HGB 13.1  HCT 36.2*  MCV 111.0*  PLT 89*   Basic Metabolic Panel:  Recent Labs Lab 07/23/16 0851  NA 137  K 3.5  CL 101  CO2 23  GLUCOSE 180*  BUN 25*  CREATININE 1.01  CALCIUM 9.6   GFR: Estimated Creatinine Clearance: 75.1 mL/min (by C-G formula based on SCr of 1.01 mg/dL). Liver Function Tests: No results for input(s): AST, ALT, ALKPHOS, BILITOT, PROT, ALBUMIN in the last 168 hours. No results for input(s): LIPASE, AMYLASE in the last 168 hours. No results for input(s): AMMONIA in the last 168 hours. Coagulation Profile: No results for input(s): INR, PROTIME in the last 168 hours. Cardiac Enzymes: No results for input(s):  CKTOTAL, CKMB, CKMBINDEX, TROPONINI in the last 168 hours. BNP (last 3 results) No results for input(s): PROBNP in the last 8760 hours. HbA1C: No results for input(s): HGBA1C in the last 72 hours. CBG: No results for input(s): GLUCAP in the last 168 hours. Lipid Profile: No results for input(s): CHOL, HDL, LDLCALC, TRIG, CHOLHDL, LDLDIRECT in the last 72 hours. Thyroid Function Tests: No results for input(s): TSH, T4TOTAL, FREET4, T3FREE, THYROIDAB in the last 72 hours. Anemia Panel: No results for input(s): VITAMINB12, FOLATE, FERRITIN, TIBC, IRON, RETICCTPCT in the last 72 hours. Urine analysis:    Component Value Date/Time   COLORURINE YELLOW 10/24/2007 2102   APPEARANCEUR CLEAR 10/24/2007 2102   LABSPEC 1.011 10/24/2007 2102   PHURINE 6.0  10/24/2007 2102   GLUCOSEU NEG mg/dL 16/11/9602 5409   BILIRUBINUR NEG 10/24/2007 2102   KETONESUR NEG mg/dL 81/19/1478 2956   PROTEINUR NEG mg/dL 21/30/8657 8469   UROBILINOGEN 0.2 10/24/2007 2102   NITRITE NEG 10/24/2007 2102   LEUKOCYTESUR NEG 10/24/2007 2102   Sepsis Labs: !!!!!!!!!!!!!!!!!!!!!!!!!!!!!!!!!!!!!!!!!!!! @LABRCNTIP (procalcitonin:4,lacticidven:4) )No results found for this or any previous visit (from the past 240 hour(s)).   Radiological Exams on Admission: No results found.  EKG: Independently reviewed. Atrial fibrillation with RBBB  Assessment/Plan Principal Problem:   Chest pain Active Problems:   Human immunodeficiency virus (HIV) disease (H[facility])   Type 2 diabetes mellitus (H[facility])   TOBACCO USER   Coronary atherosclerosis   Cocaine abuse   Thrombocytopenia ([facility])     Chest pain - cycle troponins - cardiology consulted by EDP - could be combination of cocaine use and new onset a. Fib - telemetry - repeat EKG in am - avoid beta blockers given cocaine use  Atrial Fibrillation - dilt drip - will transition to PO when heart rate is controlled - eliquis - will need to consult CM to ensure patient can afford  eliquis  HIV - continue home medications  Type II DM - carb modified diet - continue glipizide  CAD - hold BB given cocaine use  HTN - BP to be controlled with diltiazem  Thrombocytopenia - platelets stable from previous draws   DVT prophylaxis: started on eliquis  Code Status: Full code Family Communication: no family bedside Disposition Plan: likely d/c back to previous environment when improved  Consults called: Cardiology- [place], called by EDP Admission status: Obs, SDU    Katrinka Blazing MD [facility] Pager [phone]  If 7PM-7AM, please contact night-coverage [email or web address] Password [email or web address]  07/23/2016, 11:57 AM

## 2016-07-23 NOTE — ED Provider Notes (Addendum)
[facility]-EMER[facility]ENCY DEPT Provider Note   CSN: [id number] Arrival date & time: 07/23/16  0813     History   Chief Complaint Chief Complaint  Patient presents with  . Hypertension    HPI Anthony Ferguson is a 66 y.o. male.  Level V caveat for urgent need for intervention. Patient presents with anterior chest pain, headache since smoking cocaine yesterday. He is status post CABG in 2001. Past medical history includes diabetes, hypertension, HIV, previous MI, cigarette smoking, cocaine abuse.  He feels dehydrated. No history of atrial fibrillation.      Past Medical History:  Diagnosis Date  . Diabetes mellitus   . HIV (human immunodeficiency virus infection) (HCC)   . Hypercholesteremia   . Hypertension   . Myocardial infarct [facility])     Patient Active Problem List   Diagnosis Date Noted  . Cocaine abuse 04/14/2015  . Normocytic anemia 04/14/2015  . Thrombocytopenia (HCC) 04/14/2015  . Human immunodeficiency virus (HIV) disease (HCC) 03/03/2006  . Chronic hepatitis C virus infection (HCC) 03/03/2006  . Type 2 diabetes mellitus (HCC) 03/03/2006  . TOBACCO USER 03/03/2006  . CORONARY ARTERY DISEASE 03/03/2006    Past Surgical History:  Procedure Laterality Date  . CARDIAC SURGERY    . CIRCUMCISION         Home Medications    Prior to Admission medications   Medication Sig Start Date End Date Taking? Authorizing Provider  aspirin 325 MG tablet Take 325 mg by mouth daily.      [provider]  glipiZIDE (GLUCOTROL XL) 10 MG 24 hr tablet TAKE 1 TABLET BY MOUTH ONCE DAILY. 06/07/15   Cliffton Asters, MD  lamiVUDine-zidovudine (COMBIVIR) 150-300 MG tablet TAKE 1 TABLET BY MOUTH TWICE DAILY. 01/04/16   Cliffton Asters, MD  metoprolol (LOPRESSOR) 50 MG tablet TAKE 1 TABLET BY MOUTH TWICE DAILY FOR BLOOD PRESSURE. 03/27/16   Cliffton Asters, MD  neomycin-bacitracin-polymyxin (NEOSPORIN) ointment Apply 1 application topically as needed (for rash). apply to eye    [provider]  nevirapine (VIRAMUNE) 200 MG tablet TAKE 1 TABLET BY MOUTH TWICE DAILY. 01/04/16   Cliffton Asters, MD  pravastatin (PRAVACHOL) 20 MG tablet TAKE 1 TABLET BY MOUTH ONCE DAILY. 01/20/16   Cliffton Asters, MD  verapamil (VERELAN PM) 200 MG 24 hr capsule Take 200 mg by mouth daily.   06/02/10   Lina Sayre, MD    Family History Family History  Problem Relation Age of Onset  . Diabetes Mother   . Heart failure Mother     Social History Social History  Substance Use Topics  . Smoking status: Current Every Day Smoker    Packs/day: 0.30    Types: Cigarettes  . Smokeless tobacco: Never Used     Comment: Trying to slow down  . Alcohol use 3.0 oz/week    5 Cans of beer per week     Comment: occas     Allergies   Hctz [hydrochlorothiazide]   Review of Systems Review of Systems  Reason unable to perform ROS: Urgent need for intervention.     Physical Exam Updated Vital Signs BP (!) 141/85   Pulse (!) 109   Temp 97.8 F (36.6 C) (Oral)   Resp (!) 23   Ht 6' 3.5" (1.918 m)   Wt 74.8 kg (165 lb)   SpO2 99%   BMI 20.35 kg/m   Physical Exam  Constitutional: He is oriented to person, place, and time.  Thin, alert, pleasant, appears dehydrated  HENT:  Head: Normocephalic and atraumatic.  Eyes: Conjunctivae are normal.  Neck: Neck supple.  Cardiovascular:  Tachycardic, irregularly irregular  Pulmonary/Chest: Effort normal and breath sounds normal.  Abdominal: Soft. Bowel sounds are normal.  Musculoskeletal: Normal range of motion.  Neurological: He is alert and oriented to person, place, and time.  Skin: Skin is warm and dry.  Psychiatric: He has a normal mood and affect. His behavior is normal.  Nursing note and vitals reviewed.    ED Treatments / Results  Labs (all labs ordered are listed, but only abnormal results are displayed) Labs Reviewed  CBC WITH DIFFERENTIAL/PLATELET - Abnormal; Notable for the following:       Result Value   RBC 3.26  (*)    HCT 36.2 (*)    MCV 111.0 (*)    MCH 40.2 (*)    MCHC 36.2 (*)    Platelets 89 (*)    All other components within normal limits  BASIC METABOLIC PANEL - Abnormal; Notable for the following:    Glucose, Bld 180 (*)    BUN 25 (*)    All other components within normal limits  ETHANOL - Abnormal; Notable for the following:    Alcohol, Ethyl (B) 48 (*)    All other components within normal limits  I-STAT TROPOININ, ED - Abnormal; Notable for the following:    Troponin i, poc 0.24 (*)    All other components within normal limits  I-STAT TROPOININ, ED - Abnormal; Notable for the following:    Troponin i, poc 0.19 (*)    All other components within normal limits  RAPID URINE DRUG SCREEN, HOSP PERFORMED    EKG  EKG Interpretation  Date/Time:  Sunday July 23 2016 08:31:48 EDT Ventricular Rate:  109 PR Interval:    QRS Duration: 124 QT Interval:  368 QTC Calculation: 503 R Axis:   -117 Text Interpretation:  Atrial fibrillation Right bundle branch block Inferolateral infarct, old Confirmed by Autumnrose Yore  MD, Waldemar Siegel ([id number]) on 07/23/2016 9:11:42 AM       Radiology No results found.  Procedures Procedures (including critical care time)  Medications Ordered in ED Medications  sodium chloride 0.9 % bolus 1,000 mL (not administered)  ondansetron (ZOFRAN) injection 4 mg (4 mg Intravenous Given 07/23/16 0939)  sodium chloride 0.9 % bolus 1,000 mL (1,000 mLs Intravenous New Bag/Given 07/23/16 0939)  fentaNYL (SUBLIMAZE) injection 50 mcg (50 mcg Intravenous Given 07/23/16 1017)     Initial Impression / Assessment and Plan / ED Course  I have reviewed the triage vital signs and the nursing notes.  Pertinent labs & imaging results that were available during my care of the patient were reviewed by me and considered in my medical decision making (see chart for details). CRITICAL CARE Performed by: Donnetta Hutching Total critical care time: 30 minutes Critical care time was exclusive of  separately billable procedures and treating other patients. Critical care was necessary to treat or prevent imminent or life-threatening deterioration. Critical care was time spent personally by me on the following activities: development of treatment plan with patient and/or surrogate as well as nursing, discussions with consultants, evaluation of patient's response to treatment, examination of patient, obtaining history from patient or surrogate, ordering and performing treatments and interventions, ordering and review of laboratory studies, ordering and review of radiographic studies, pulse oximetry and re-evaluation of patient's condition.    Patient is hemodynamically stable. EKG shows atrial fibrillation with rate of 109. First troponin 0.24. Second troponin 0.19. Will consult cardiology.  1050:  discussed with cardiologist. No acute intervention recommended.  Suggest  Rx Cardizem and Eliquis and avoidance of beta blocker.  Admit to general medicine. Final Clinical Impressions(s) / ED Diagnoses   Final diagnoses:  Chest pain, unspecified type  Elevated troponin  Atrial fibrillation, unspecified type (HCC)  Cocaine use  HIV (human immunodeficiency virus infection) (HCC)    New Prescriptions New Prescriptions   No medications on file     Donnetta Hutchingook, Eudell Mcphee, MD 07/23/16 1023    Donnetta Hutchingook, Britteny Fiebelkorn, MD 07/23/16 1057

## 2016-07-23 NOTE — ED Notes (Addendum)
CRITICAL VALUE ALERT  Critical Value:  Troponin 0.19  Date & Time Notied:  07/23/2016  Provider Notified: Dr Adriana Simasook  Orders Received/Actions taken: Consult Cardiology

## 2016-07-23 NOTE — ED Triage Notes (Signed)
Pt reports hypertension for last several days, right leg pain intermittently. Pt also reports "i used to have bed bugs, sprayed my apartment and then went to a house that also had bed bugs." pt reports also "tried cocaine/heroine, or so I thought," pt reports hallucinations yesterday but denies at this time. Pt denies chest pain,shortness of breath. nad noted.

## 2016-07-23 NOTE — ED Notes (Signed)
Report given to ICU

## 2016-07-23 NOTE — Progress Notes (Signed)
ANTICOAGULATION CONSULT NOTE - Initial Consult  Pharmacy Consult for Apixaban Indication: atrial fibrillation  Allergies  Allergen Reactions  . Hctz [Hydrochlorothiazide] Other (See Comments)    "ran my sugar up"  . Pork-Derived Products Palpitations and Hypertension    Patient Measurements: Height: 6\' 3"  (190.5 cm) Weight: 162 lb 14.7 oz (73.9 kg) IBW/kg (Calculated) : 84.5  Vital Signs: Temp: 97.8 F (36.6 C) (06/03 0829) Temp Source: Oral (06/03 0829) BP: 125/88 (06/03 1300) Pulse Rate: 95 (06/03 1300)  Labs:  Recent Labs  07/23/16 0851  HGB 13.1  HCT 36.2*  PLT 89*  CREATININE 1.01    Estimated Creatinine Clearance: 74.2 mL/min (by C-G formula based on SCr of 1.01 mg/dL).   Medical History: Past Medical History:  Diagnosis Date  . Diabetes mellitus   . HIV (human immunodeficiency virus infection) (HCC)   . Hypercholesteremia   . Hypertension   . Myocardial infarct [facility](HCC)     Medications:  Prescriptions Prior to Admission  Medication Sig Dispense Refill Last Dose  . aspirin 325 MG tablet Take 325 mg by mouth daily.     07/22/2016 at Unknown time  . glipiZIDE (GLUCOTROL XL) 10 MG 24 hr tablet TAKE 1 TABLET BY MOUTH ONCE DAILY. 30 tablet 3 Past Week at Unknown time  . hydrocortisone valerate cream (WESTCORT) 0.2 % Apply 1 application topically daily.   07/22/2016 at Unknown time  . lamiVUDine-zidovudine (COMBIVIR) 150-300 MG tablet TAKE 1 TABLET BY MOUTH TWICE DAILY. 60 tablet 5 07/22/2016 at Unknown time  . lidocaine-prilocaine (EMLA) cream Apply 1 application topically daily.   07/22/2016 at Unknown time  . metoprolol (LOPRESSOR) 50 MG tablet TAKE 1 TABLET BY MOUTH TWICE DAILY FOR BLOOD PRESSURE. 60 tablet 5 07/22/2016 at 2030  . neomycin-bacitracin-polymyxin (NEOSPORIN) ointment Apply 1 application topically as needed (for rash). apply to eye   Past Month at Unknown time  . nevirapine (VIRAMUNE) 200 MG tablet TAKE 1 TABLET BY MOUTH TWICE DAILY. 60 tablet 5 07/22/2016  at Unknown time  . pravastatin (PRAVACHOL) 20 MG tablet TAKE 1 TABLET BY MOUTH ONCE DAILY. 90 tablet 0 07/22/2016 at Unknown time  . verapamil (VERELAN PM) 200 MG 24 hr capsule Take 200 mg by mouth daily.     07/22/2016 at Unknown time  . omega-3 acid ethyl esters (LOVAZA) 1 g capsule Take 1 capsule by mouth 2 (two) times daily.       Assessment: 66 yo male presents to ED with chest pain and headache. PMH significant of HIV, HLD, HTN, MI and DM.Patient found to be in atrial fibrillation and initial troponin of >0.2.  Dr. Anne FuSkains of cardiology recommended avoidance of beta blockers and using diltiazem and eliquis for new atrial fibrillation.   Goal of Therapy:  Monitor platelets by anticoagulation protocol: Yes   Plan:  Eliquis 5mg  po BID Monitor for S/S of bleeding Educate on eliquis  Elder CyphersLorie Isla Sabree, BS Loura BackPharm D, BCPS Clinical Pharmacist Pager [phone]  07/23/2016,2:06 PM

## 2016-07-24 ENCOUNTER — Emergency Department (HOSPITAL_COMMUNITY)
Admission: EM | Admit: 2016-07-24 | Discharge: 2016-07-24 | Disposition: A | Discharge status: Home / Self Care | Payer: Medicare HMO | Attending: Emergency Medicine | Admitting: Emergency Medicine

## 2016-07-24 ENCOUNTER — Encounter (HOSPITAL_COMMUNITY): Payer: Self-pay | Admitting: Cardiology

## 2016-07-24 ENCOUNTER — Encounter (HOSPITAL_COMMUNITY): Payer: Self-pay | Admitting: *Deleted

## 2016-07-24 DIAGNOSIS — Z4801 Encounter for change or removal of surgical wound dressing: Secondary | ICD-10-CM | POA: Diagnosis present

## 2016-07-24 DIAGNOSIS — Z7984 Long term (current) use of oral hypoglycemic drugs: Secondary | ICD-10-CM | POA: Insufficient documentation

## 2016-07-24 DIAGNOSIS — Z79899 Other long term (current) drug therapy: Secondary | ICD-10-CM | POA: Diagnosis not present

## 2016-07-24 DIAGNOSIS — I10 Essential (primary) hypertension: Secondary | ICD-10-CM | POA: Diagnosis not present

## 2016-07-24 DIAGNOSIS — Y929 Unspecified place or not applicable: Secondary | ICD-10-CM | POA: Diagnosis not present

## 2016-07-24 DIAGNOSIS — T148XXA Other injury of unspecified body region, initial encounter: Secondary | ICD-10-CM

## 2016-07-24 DIAGNOSIS — I4891 Unspecified atrial fibrillation: Secondary | ICD-10-CM | POA: Diagnosis not present

## 2016-07-24 DIAGNOSIS — E119 Type 2 diabetes mellitus without complications: Secondary | ICD-10-CM | POA: Insufficient documentation

## 2016-07-24 DIAGNOSIS — Y939 Activity, unspecified: Secondary | ICD-10-CM | POA: Insufficient documentation

## 2016-07-24 DIAGNOSIS — Z21 Asymptomatic human immunodeficiency virus [HIV] infection status: Secondary | ICD-10-CM | POA: Diagnosis not present

## 2016-07-24 DIAGNOSIS — F17210 Nicotine dependence, cigarettes, uncomplicated: Secondary | ICD-10-CM | POA: Insufficient documentation

## 2016-07-24 DIAGNOSIS — Y999 Unspecified external cause status: Secondary | ICD-10-CM | POA: Diagnosis not present

## 2016-07-24 DIAGNOSIS — B20 Human immunodeficiency virus [HIV] disease: Secondary | ICD-10-CM | POA: Diagnosis not present

## 2016-07-24 DIAGNOSIS — X58XXXA Exposure to other specified factors, initial encounter: Secondary | ICD-10-CM | POA: Diagnosis not present

## 2016-07-24 DIAGNOSIS — F1410 Cocaine abuse, uncomplicated: Secondary | ICD-10-CM | POA: Diagnosis not present

## 2016-07-24 DIAGNOSIS — S60521A Blister (nonthermal) of right hand, initial encounter: Secondary | ICD-10-CM | POA: Diagnosis not present

## 2016-07-24 DIAGNOSIS — I25119 Atherosclerotic heart disease of native coronary artery with unspecified angina pectoris: Secondary | ICD-10-CM | POA: Diagnosis not present

## 2016-07-24 LAB — RAPID URINE DRUG SCREEN, HOSP PERFORMED
Amphetamines: NOT DETECTED
BENZODIAZEPINES: NOT DETECTED
Barbiturates: NOT DETECTED
COCAINE: POSITIVE — AB
OPIATES: NOT DETECTED
TETRAHYDROCANNABINOL: POSITIVE — AB

## 2016-07-24 LAB — COMPREHENSIVE METABOLIC PANEL
ALT: 81 U/L — ABNORMAL HIGH (ref 17–63)
ANION GAP: 4 — AB (ref 5–15)
AST: 421 U/L — AB (ref 15–41)
Albumin: 2.9 g/dL — ABNORMAL LOW (ref 3.5–5.0)
Alkaline Phosphatase: 61 U/L (ref 38–126)
BILIRUBIN TOTAL: 2.3 mg/dL — AB (ref 0.3–1.2)
BUN: 20 mg/dL (ref 6–20)
CO2: 28 mmol/L (ref 22–32)
Calcium: 8.8 mg/dL — ABNORMAL LOW (ref 8.9–10.3)
Chloride: 107 mmol/L (ref 101–111)
Creatinine, Ser: 0.69 mg/dL (ref 0.61–1.24)
Glucose, Bld: 99 mg/dL (ref 65–99)
POTASSIUM: 3.8 mmol/L (ref 3.5–5.1)
Sodium: 139 mmol/L (ref 135–145)
TOTAL PROTEIN: 6 g/dL — AB (ref 6.5–8.1)

## 2016-07-24 LAB — GLUCOSE, CAPILLARY: GLUCOSE-CAPILLARY: 128 mg/dL — AB (ref 65–99)

## 2016-07-24 LAB — TROPONIN I: Troponin I: 0.08 ng/mL (ref <0.03)

## 2016-07-24 MED ORDER — BACITRACIN ZINC 500 UNIT/GM EX OINT
TOPICAL_OINTMENT | CUTANEOUS | Status: AC
  Filled 2016-07-24: qty 0.9

## 2016-07-24 MED ORDER — DILTIAZEM HCL 30 MG PO TABS: 30.0000 mg | ORAL_TABLET | Freq: Three times a day (TID) | ORAL | 0 refills | Status: DC

## 2016-07-24 MED ORDER — APIXABAN 5 MG PO TABS: 5.0000 mg | ORAL_TABLET | Freq: Two times a day (BID) | ORAL | 0 refills | Status: DC

## 2016-07-24 MED ORDER — DILTIAZEM HCL 30 MG PO TABS
30.0000 mg | ORAL_TABLET | Freq: Three times a day (TID) | ORAL | Status: DC
  Administered 2016-07-24: 30 mg via ORAL
  Filled 2016-07-24: qty 1

## 2016-07-24 NOTE — ED Notes (Signed)
Small skin tear on right hand cleaned and dressed with bacitracin and nonstick dressing.

## 2016-07-24 NOTE — Clinical Social Work Note (Addendum)
Clinical Social Work Assessment  Patient Details  Name: Anthony Ferguson MRN: [id number] DNapoleon Ferguson of Birth: 02/07/1951  Date of referral:  07/24/16               Reason for consult:  Substance Use/ETOH Abuse                Permission sought to share information with:    Permission granted to share information::     Name::        Agency::     Relationship::     Contact Information:     Housing/Transportation Living arrangements for the past 2 months:  Apartment Source of Information:  Patient Patient Interpreter Needed:  None Criminal Activity/Legal Involvement Pertinent to Current Situation/Hospitalization:    Significant Relationships:  Adult Children Lives with:  Self Do you feel safe going back to the place where you live?  Yes Need for family participation in patient care:  Yes (Comment)  Care giving concerns:  None identified at baseline.    Social Worker assessment / plan:  Patient lives alone. He admits to doing "a little bit of drugs." Patient states that he used cocaine recently which was the first time in over a month. He stated that this time he began hallucinating. Patient stated that he lived a sober life from 2001-2007. Patient stated that he uses "every now and then."  Patient stated that he is uncertain of his exact trigger to his recent episode of use. He attributed it being influenced by the wrong people. He declined residential treatment. He will consider out patient treatment. LCSW provided out patient treatment options and offered assistance in appointment scheduling. Patient stated that he was not yet ready for outpatient and felt he could stop using with "God's help." LCSW signing off.   Employment status:  Retired Database administratornsurance information:  Managed Medicare PT Recommendations:  Not assessed at this time Information / Referral to community resources:  Outpatient Substance Abuse Treatment Options  Patient/Family's Response to care:  Patient is considering out patient  treatment.   Patient/Family's Understanding of and Emotional Response to Diagnosis, Current Treatment, and Prognosis:  Patient understands his diagnosis, treatment and prognosis.   Emotional Assessment Appearance:  Appears stated age Attitude/Demeanor/Rapport:    Affect (typically observed):  Accepting, Calm Orientation:  Oriented to Situation, Oriented to  Time, Oriented to Place, Oriented to Self Alcohol / Substance use:  Not Applicable Psych involvement (Current and /or in the community):  No (Comment)  Discharge Needs  Concerns to be addressed:  Substance Abuse Concerns Readmission within the last 30 days:  No Current discharge risk:  None Barriers to Discharge:  No Barriers Identified   Annice NeedySettle, Anthony Ferguson D, LCSW 07/24/2016, 10:57 AM

## 2016-07-24 NOTE — Progress Notes (Signed)
DAUGHTER NATASHIA CALLED TO SAY SOMEONE WILL PICK PT UP IN APPROX .

## 2016-07-24 NOTE — Progress Notes (Signed)
PT DISCHARGES HOME ACCOMPANIED BY DAUGHTER.

## 2016-07-24 NOTE — Plan of Care (Signed)
Problem: Education: Goal: Knowledge of disease or condition will improve Outcome: Progressing Pt given information about atrial fib.

## 2016-07-24 NOTE — Discharge Instructions (Signed)
Contact a health care provider if: °You have more redness, swelling, or pain around your blister. °You have more fluid or blood coming from your blister. °Your blister feels warm to the touch. °You have pus or a bad smell coming from your blister. °You have a fever or chills. °Your blister gets better and then it gets worse. °

## 2016-07-24 NOTE — Care Management Note (Signed)
Case Management Note  Patient Details  Name: Anthony Ferguson MRN: [id number] Date of Birth: 09/12/1950  Subjective/Objective: Adm with CP/Afib with RVR. CSW consulted for substance abuse. Ind PTA. Benefits check for Eliquis completed. Has PCP, no transportation issues and reports no issues affording medication.                   Action/Plan: If ordered Eliquis on DC, patient will get 30 day free voucher from pharmacy. No other CM needs.    Expected Discharge Date:  07/28/16               Expected Discharge Plan:  Home/Self Care  In-House Referral:     Discharge planning Services  CM Consult, Medication Assistance  Post Acute Care Choice:    Choice offered to:  NA  DME Arranged:    DME Agency:     HH Arranged:    HH Agency:     Status of Service:  Completed, signed off  If discussed at [employer] Length of Stay Meetings, dates discussed:    Additional Comments:  Sheryle Vice, Chrystine OilerSharley Diane, RN 07/24/2016, 11:55 AM

## 2016-07-24 NOTE — Discharge Summary (Signed)
Physician Discharge Summary  Anthony FormRaymond J Berke WUJ:[id number] DOB: 10/03/1950 DOA: 07/23/2016  PCP: Pearson GrippeKim, James, MD  Admit date: 07/23/2016 Discharge date: 07/24/2016  Admitted From:  Home  Disposition:  Home   Recommendations for Outpatient Follow-up:  1. Follow up within this week 2. Take new medications as prescribed 3. Please obtain cMP/CBC in one week 4. Discuss liver enzymes with your PCP and ID specialist 5. Abstain from cocaine and THC 6. Please consider outpatient rehab  Home Health: No Equipment/Devices: None  Discharge Condition: Stable CODE STATUS: Full code Diet recommendation: Heart Healthy  Brief/Interim Summary: Anthony Ferguson is a 66 y.o. male with medical history significant of HIV, HLD, HTN, MI and DM presents with concerns of headache and anterior chest pain.  Per patient these symptoms began after smoking cocaine yesterday.   Patient reports that he does not partake in cocaine use regularly but that he did yesterday.  After smoking cocaine his symptoms began and he states he told his friend.  He did not seek medical attention yesterday because he believed his symptoms would improve.  He still has a headache.  The chest pain is intermittent and feels like his heart is skipping a beat.  Headache feels as though he can't keep his eyes open in light.  Occasionally feels nauseous.  Denies diarrhea or constipation.  In the ED patient troponin was found to be elevated at 0.24 and patient was in atrial fibrillation.  Cardiology was consulted by EDP who recommended avoidance of beta blockers and starting patient on Cardizem as well as Eliquis.  Patient was transitioned to PO Cardizem and was given a voucher for Eliquis.  He was instructed to take his medications as prescribed and follow up with his PCP.  He was encouraged to abstain from cocaine and to seek outpatient treatment.  Discharge Diagnoses:  Principal Problem:   Chest pain Active Problems:   Human immunodeficiency  virus (HIV) disease (HCC)   Type 2 diabetes mellitus (HCC)   TOBACCO USER   Coronary atherosclerosis   Cocaine abuse   Thrombocytopenia (HCC)    Discharge Instructions  Discharge Instructions    Call MD for:  difficulty breathing, headache or visual disturbances    Complete by:  As directed    Call MD for:  extreme fatigue    Complete by:  As directed    Call MD for:  hives    Complete by:  As directed    Call MD for:  persistant dizziness or light-headedness    Complete by:  As directed    Call MD for:  persistant nausea and vomiting    Complete by:  As directed    Call MD for:  severe uncontrolled pain    Complete by:  As directed    Call MD for:  temperature >100.4    Complete by:  As directed    Diet - low sodium heart healthy    Complete by:  As directed    Discharge instructions    Complete by:  As directed    Follow up outpatient with drug rehab options Abstain from cocaine and marijuana Take medications as prescribed Take new medications as prescribed Follow up with your PCP within 1 week   Increase activity slowly    Complete by:  As directed      Allergies as of 07/24/2016      Reactions   Hctz [hydrochlorothiazide] Other (See Comments)   "ran my sugar up"   Pork-derived Products Palpitations, Hypertension  Medication List    STOP taking these medications   aspirin 325 MG tablet   metoprolol tartrate 50 MG tablet Commonly known as:  LOPRESSOR   verapamil 200 MG 24 hr capsule Commonly known as:  VERELAN PM     TAKE these medications   apixaban 5 MG Tabs tablet Commonly known as:  ELIQUIS Take 1 tablet (5 mg total) by mouth 2 (two) times daily.   diltiazem 30 MG tablet Commonly known as:  CARDIZEM Take 1 tablet (30 mg total) by mouth 3 (three) times daily.   glipiZIDE 10 MG 24 hr tablet Commonly known as:  GLUCOTROL XL TAKE 1 TABLET BY MOUTH ONCE DAILY.   hydrocortisone valerate cream 0.2 % Commonly known as:  WESTCORT Apply 1  application topically daily.   lamiVUDine-zidovudine 150-300 MG tablet Commonly known as:  COMBIVIR TAKE 1 TABLET BY MOUTH TWICE DAILY.   lidocaine-prilocaine cream Commonly known as:  EMLA Apply 1 application topically daily.   neomycin-bacitracin-polymyxin ointment Commonly known as:  NEOSPORIN Apply 1 application topically as needed (for rash). apply to eye   nevirapine 200 MG tablet Commonly known as:  VIRAMUNE TAKE 1 TABLET BY MOUTH TWICE DAILY.   omega-3 acid ethyl esters 1 g capsule Commonly known as:  LOVAZA Take 1 capsule by mouth 2 (two) times daily.   pravastatin 20 MG tablet Commonly known as:  PRAVACHOL TAKE 1 TABLET BY MOUTH ONCE DAILY.      Follow-up Information    Pearson Grippe, MD. Schedule an appointment as soon as possible for a visit in 1 week(s).   Specialty:  Internal Medicine Contact information: [address] [place] [address] [phone]        Cliffton Asters, MD .   Specialty:  Infectious Diseases Contact information: [address] [place] [address] [phone]          Allergies  Allergen Reactions  . Hctz [Hydrochlorothiazide] Other (See Comments)    "ran my sugar up"  . Pork-Derived Products Palpitations and Hypertension    Consultations:  Cardiology (by EDP)   Procedures/Studies:  No results found.    Subjective: Patient feeling well today.  Reports occasional chest soreness and occasional headache. Wants to eat breakfast.  Discharge Exam: Vitals:   07/24/16 1300 07/24/16 1400  BP: 131/80 (!) 129/93  Pulse: 89 76  Resp: 18 (!) 22  Temp:     Vitals:   07/24/16 1114 07/24/16 1200 07/24/16 1300 07/24/16 1400  BP:  126/72 131/80 (!) 129/93  Pulse: 76 82 89 76  Resp: (!) 24 (!) 23 18 (!) 22  Temp: 99.4 F (37.4 C)     TempSrc: Oral     SpO2: 97% 98% 99% 98%  Weight:      Height:        General: Pt is alert, awake, not in acute distress Cardiovascular: RRR, S1/S2  +, no rubs, no gallops Respiratory: CTA bilaterally, no wheezing, no rhonchi Abdominal: Soft, NT, ND, bowel sounds + Extremities: no edema, no cyanosis    The results of significant diagnostics from this hospitalization (including imaging, microbiology, ancillary and laboratory) are listed below for reference.     Microbiology: Recent Results (from the past 240 hour(s))  MRSA PCR Screening     Status: None   Collection Time: 07/23/16  2:08 PM  Result Value Ref Range Status   MRSA by PCR NEGATIVE NEGATIVE Final    Comment:  The GeneXpert MRSA Assay (FDA approved for NASAL specimens only), is one component of a comprehensive MRSA colonization surveillance program. It is not intended to diagnose MRSA infection nor to guide or monitor treatment for MRSA infections.      Labs: BNP (last 3 results) No results for input(s): BNP in the last 8760 hours. Basic Metabolic Panel:  Recent Labs Lab 07/23/16 0851 07/24/16 0225  NA 137 139  K 3.5 3.8  CL 101 107  CO2 23 28  GLUCOSE 180* 99  BUN 25* 20  CREATININE 1.01 0.69  CALCIUM 9.6 8.8*   Liver Function Tests:  Recent Labs Lab 07/24/16 0225  AST 421*  ALT 81*  ALKPHOS 61  BILITOT 2.3*  PROT 6.0*  ALBUMIN 2.9*   No results for input(s): LIPASE, AMYLASE in the last 168 hours. No results for input(s): AMMONIA in the last 168 hours. CBC:  Recent Labs Lab 07/23/16 0851  WBC 6.9  NEUTROABS 4.3  HGB 13.1  HCT 36.2*  MCV 111.0*  PLT 89*   Cardiac Enzymes:  Recent Labs Lab 07/23/16 1443 07/23/16 2048 07/24/16 0225  TROPONINI 0.17* 0.10* 0.08*   BNP: Invalid input(s): POCBNP CBG:  Recent Labs Lab 07/23/16 1615  GLUCAP 128*   D-Dimer No results for input(s): DDIMER in the last 72 hours. Hgb A1c No results for input(s): HGBA1C in the last 72 hours. Lipid Profile No results for input(s): CHOL, HDL, LDLCALC, TRIG, CHOLHDL, LDLDIRECT in the last 72 hours. Thyroid function studies No results  for input(s): TSH, T4TOTAL, T3FREE, THYROIDAB in the last 72 hours.  Invalid input(s): FREET3 Anemia work up No results for input(s): VITAMINB12, FOLATE, FERRITIN, TIBC, IRON, RETICCTPCT in the last 72 hours. Urinalysis    Component Value Date/Time   COLORURINE YELLOW 10/24/2007 2102   APPEARANCEUR CLEAR 10/24/2007 2102   LABSPEC 1.011 10/24/2007 2102   PHURINE 6.0 10/24/2007 2102   GLUCOSEU NEG mg/dL 16/11/9602 5409   BILIRUBINUR NEG 10/24/2007 2102   KETONESUR NEG mg/dL 81/19/1478 2956   PROTEINUR NEG mg/dL 21/30/8657 8469   UROBILINOGEN 0.2 10/24/2007 2102   NITRITE NEG 10/24/2007 2102   LEUKOCYTESUR NEG 10/24/2007 2102   Sepsis Labs Invalid input(s): PROCALCITONIN,  WBC,  LACTICIDVEN Microbiology Recent Results (from the past 240 hour(s))  MRSA PCR Screening     Status: None   Collection Time: 07/23/16  2:08 PM  Result Value Ref Range Status   MRSA by PCR NEGATIVE NEGATIVE Final    Comment:        The GeneXpert MRSA Assay (FDA approved for NASAL specimens only), is one component of a comprehensive MRSA colonization surveillance program. It is not intended to diagnose MRSA infection nor to guide or monitor treatment for MRSA infections.      Time coordinating discharge: 35 minutes  SIGNED:   Katrinka Blazing, MD  [facility] 07/24/2016, 3:55 PM Pager [phone] If 7PM-7AM, please contact night-coverage [email or web address] Password [email or web address]

## 2016-07-24 NOTE — Care Management Obs Status (Signed)
MEDICARE OBSERVATION STATUS NOTIFICATION   Patient Details  Name: Anthony Ferguson MRN: [id number] Date of Birth: 09/14/1950   Medicare Observation Status Notification Given:  Yes    Adabella Stanis, Chrystine OilerSharley Diane, RN 07/24/2016, 11:55 AM

## 2016-07-24 NOTE — Progress Notes (Signed)
WHEN PT GIVEN DISCHARGE INSTRUCTION ,PHARAMCY VOUCHER FOR ELIQUIS AND EDUCATION HAND OUTS, PT STATED THAT HE DID NOT HAVE A WAY HOME AND THAT HE FELT TOO WEAK TO CARE FOR HIMSELF AT HOME. PT UP IN [facility]CL[facility] FOR SEVERAL HOURS. DR HERNANDEZ  (ON CALL FOR DR KADOLPH) INFORMED OF THIS INFORMATION. THE DISCHARGE IS TO BE FOLLOWED. THIS RN SPOKE TO PT'S DAUGHTER NATISHA, WHO IS TRYING TO ARRANGE TRANSPORTATION FOR PT TO GO HOME.PT IS UPSET THAT HE STILL MUST GO HOME.

## 2016-07-24 NOTE — ED Triage Notes (Signed)
Pt was here seen in ED yesterday and was admitted, discharged home today. Once at home daughter noticed pt has a skin tear on right hand that she is concerned about. NAD noted. No bleeding, swelling, or redness noted.

## 2016-07-24 NOTE — Care Management (Signed)
Benefits check for Eliquis : ELIQUIS   2.50 MG BID   AND  5 MG BID   COVER- YES                 SAME  CO-PAY- $8.35                SAME  TIER- 3 DRUG                SAME  PRIOR APPROVAL- NO        NO   PREFERRED PHARMACY : [facility],  [facility] AND [facility]

## 2016-07-24 NOTE — ED Provider Notes (Signed)
[facility]-EM[facility]Y DEPT Provider Note   CSN: [id number] Arrival date & time: 07/24/16  1757     History   Chief Complaint Chief Complaint  Patient presents with  . Dressing Change    HPI Anthony Ferguson is a 66 y.o. male with past medical history of HIV, cocaine abuse, discharged this morning from the hospital after admission for chest pain with elevated troponins. The patient's daughter noticed an open wound on the patient's hand and send him back to the emergency department. Patient states he has minimal pain and thinks that it might be a blister or skin tear. He denies any known injuries. He is not sure when it first occurred.  HPI  Past Medical History:  Diagnosis Date  . HIV (human immunodeficiency virus infection) (HCC)   . Hypercholesteremia   . Hypertension   . Type 2 diabetes mellitus [facility])     Patient Active Problem List   Diagnosis Date Noted  . Chest pain 07/23/2016  . Cocaine abuse 04/14/2015  . Normocytic anemia 04/14/2015  . Thrombocytopenia (HCC) 04/14/2015  . Human immunodeficiency virus (HIV) disease (HCC) 03/03/2006  . Chronic hepatitis C virus infection (HCC) 03/03/2006  . Type 2 diabetes mellitus (HCC) 03/03/2006  . TOBACCO USER 03/03/2006  . Coronary atherosclerosis 03/03/2006    Past Surgical History:  Procedure Laterality Date  . CARDIAC SURGERY    . CIRCUMCISION         Home Medications    Prior to Admission medications   Medication Sig Start Date End Date Taking? Authorizing Provider  apixaban (ELIQUIS) 5 MG TABS tablet Take 1 tablet (5 mg total) by mouth 2 (two) times daily. 07/24/16   Filbert Schilder, MD  diltiazem (CARDIZEM) 30 MG tablet Take 1 tablet (30 mg total) by mouth 3 (three) times daily. 07/24/16   Filbert Schilder, MD  glipiZIDE (GLUCOTROL XL) 10 MG 24 hr tablet TAKE 1 TABLET BY MOUTH ONCE DAILY. 06/07/15   Cliffton Asters, MD  hydrocortisone valerate cream (WESTCORT) 0.2 % Apply 1 application topically daily. 06/22/16    [provider]  lamiVUDine-zidovudine (COMBIVIR) 150-300 MG tablet TAKE 1 TABLET BY MOUTH TWICE DAILY. 01/04/16   Cliffton Asters, MD  lidocaine-prilocaine (EMLA) cream Apply 1 application topically daily. 06/19/16   [provider]  neomycin-bacitracin-polymyxin (NEOSPORIN) ointment Apply 1 application topically as needed (for rash). apply to eye    [provider]  nevirapine (VIRAMUNE) 200 MG tablet TAKE 1 TABLET BY MOUTH TWICE DAILY. 01/04/16   Cliffton Asters, MD  omega-3 acid ethyl esters (LOVAZA) 1 g capsule Take 1 capsule by mouth 2 (two) times daily. 06/19/16   [provider]  pravastatin (PRAVACHOL) 20 MG tablet TAKE 1 TABLET BY MOUTH ONCE DAILY. 01/20/16   Cliffton Asters, MD    Family History Family History  Problem Relation Age of Onset  . Diabetes Mother   . Heart failure Mother     Social History Social History  Substance Use Topics  . Smoking status: Current Every Day Smoker    Packs/day: 0.30    Types: Cigarettes  . Smokeless tobacco: Never Used     Comment: Trying to slow down  . Alcohol use 3.0 oz/week    5 Cans of beer per week     Comment: occas     Allergies   Hctz [hydrochlorothiazide] and Pork-derived products   Review of Systems Review of Systems Ten systems reviewed and are negative for acute change, except as noted in the HPI.  Physical Exam Updated Vital Signs BP 128/68 (BP Location: Right Arm)   Pulse 78   Temp 98.1 F (36.7 C) (Oral)   Resp 18   Wt 74.4 kg (164 lb)   SpO2 98%   BMI 20.50 kg/m   Physical Exam  Constitutional: He appears well-developed and well-nourished. No distress.  HENT:  Head: Normocephalic and atraumatic.  Eyes: Conjunctivae are normal. No scleral icterus.  Neck: Normal range of motion. Neck supple.  Cardiovascular: Normal rate, regular rhythm and normal heart sounds.   Pulmonary/Chest: Effort normal and breath sounds normal. No respiratory distress.  Abdominal: Soft. There  is no tenderness.  Musculoskeletal: He exhibits no edema.  Neurological: He is alert.  Skin: Skin is warm and dry. He is not diaphoretic.  3 cm bulla of right hand that appears old. There is skin covering the majority of the blister with about a 1 cm circular lesion which was deroofed. It is dry, nontender, no heat, redness or erythema. It appears old  Psychiatric: His behavior is normal.  Nursing note and vitals reviewed.    ED Treatments / Results  Labs (all labs ordered are listed, but only abnormal results are displayed) Labs Reviewed - No data to display  EKG  EKG Interpretation None       Radiology No results found.  Procedures Procedures (including critical care time)  Medications Ordered in ED Medications - No data to display   Initial Impression / Assessment and Plan / ED Course  I have reviewed the triage vital signs and the nursing notes.  Pertinent labs & imaging results that were available during my care of the patient were reviewed by me and considered in my medical decision making (see chart for details).     Wound cleansed, bandage applied. No signs of infection. Follow-up with PCP in 2 days. Proceed for discharge at this time  Final Clinical Impressions(s) / ED Diagnoses   Final diagnoses:  Blister    New Prescriptions New Prescriptions   No medications on file     Delos HaringHarris, Bennie Chirico, PA-C 07/24/16 Anthony [id number]    Yelverton, David, MD 07/27/16 1500

## 2016-08-24 ENCOUNTER — Other Ambulatory Visit: Payer: Self-pay | Admitting: Internal Medicine

## 2016-08-24 DIAGNOSIS — B20 Human immunodeficiency virus [HIV] disease: Secondary | ICD-10-CM

## 2016-09-12 ENCOUNTER — Other Ambulatory Visit: Payer: Self-pay | Admitting: Internal Medicine

## 2016-09-12 DIAGNOSIS — I25110 Atherosclerotic heart disease of native coronary artery with unstable angina pectoris: Secondary | ICD-10-CM

## 2016-09-29 ENCOUNTER — Other Ambulatory Visit: Payer: Self-pay | Admitting: Internal Medicine

## 2016-09-29 DIAGNOSIS — B20 Human immunodeficiency virus [HIV] disease: Secondary | ICD-10-CM

## 2016-10-02 ENCOUNTER — Other Ambulatory Visit: Payer: Medicare HMO

## 2016-10-04 ENCOUNTER — Other Ambulatory Visit: Payer: Self-pay | Admitting: Internal Medicine

## 2016-10-04 DIAGNOSIS — B20 Human immunodeficiency virus [HIV] disease: Secondary | ICD-10-CM

## 2016-10-09 ENCOUNTER — Other Ambulatory Visit: Payer: Medicare HMO

## 2016-10-12 ENCOUNTER — Other Ambulatory Visit: Payer: Medicare HMO

## 2016-10-12 ENCOUNTER — Other Ambulatory Visit (HOSPITAL_COMMUNITY)
Admission: RE | Admit: 2016-10-12 | Discharge: 2016-10-12 | Disposition: A | Discharge status: Home / Self Care | Payer: Medicare HMO | Source: Ambulatory Visit | Attending: Internal Medicine | Admitting: Internal Medicine

## 2016-10-12 DIAGNOSIS — Z113 Encounter for screening for infections with a predominantly sexual mode of transmission: Secondary | ICD-10-CM | POA: Diagnosis present

## 2016-10-12 DIAGNOSIS — B20 Human immunodeficiency virus [HIV] disease: Secondary | ICD-10-CM | POA: Insufficient documentation

## 2016-10-12 LAB — CBC WITH DIFFERENTIAL/PLATELET
BASOS PCT: 1 %
Basophils Absolute: 21 cells/uL (ref 0–200)
EOS ABS: 42 {cells}/uL (ref 15–500)
Eosinophils Relative: 2 %
HEMATOCRIT: 38.9 % (ref 38.5–50.0)
HEMOGLOBIN: 13.5 g/dL (ref 13.2–17.1)
LYMPHS ABS: 987 {cells}/uL (ref 850–3900)
LYMPHS PCT: 47 %
MCH: 37.9 pg — ABNORMAL HIGH (ref 27.0–33.0)
MCHC: 34.7 g/dL (ref 32.0–36.0)
MCV: 109.3 fL — AB (ref 80.0–100.0)
MONO ABS: 315 {cells}/uL (ref 200–950)
MPV: 11.1 fL (ref 7.5–12.5)
Monocytes Relative: 15 %
NEUTROS ABS: 735 {cells}/uL — AB (ref 1500–7800)
Neutrophils Relative %: 35 %
Platelets: 99 10*3/uL — ABNORMAL LOW (ref 140–400)
RBC: 3.56 MIL/uL — AB (ref 4.20–5.80)
RDW: 15.2 % — ABNORMAL HIGH (ref 11.0–15.0)
WBC: 2.1 10*3/uL — ABNORMAL LOW (ref 3.8–10.8)

## 2016-10-13 LAB — PATHOLOGIST SMEAR REVIEW

## 2016-10-13 LAB — URINE CYTOLOGY ANCILLARY ONLY
Chlamydia: NEGATIVE
NEISSERIA GONORRHEA: NEGATIVE

## 2016-10-13 LAB — COMPLETE METABOLIC PANEL WITH GFR
ALBUMIN: 3.6 g/dL (ref 3.6–5.1)
ALT: 37 U/L (ref 9–46)
AST: 58 U/L — AB (ref 10–35)
Alkaline Phosphatase: 103 U/L (ref 40–115)
BUN: 15 mg/dL (ref 7–25)
CALCIUM: 9.3 mg/dL (ref 8.6–10.3)
CHLORIDE: 104 mmol/L (ref 98–110)
CO2: 25 mmol/L (ref 20–32)
CREATININE: 0.92 mg/dL (ref 0.70–1.25)
GFR, Est Non African American: 85 mL/min (ref >=60)
Glucose, Bld: 95 mg/dL (ref 65–99)
Potassium: 4.7 mmol/L (ref 3.5–5.3)
Sodium: 140 mmol/L (ref 135–146)
Total Bilirubin: 0.7 mg/dL (ref 0.2–1.2)
Total Protein: 7 g/dL (ref 6.1–8.1)

## 2016-10-13 LAB — T-HELPER CELL (CD4) - (RCID CLINIC ONLY)
CD4 % Helper T Cell: 43 % (ref 33–55)
CD4 T CELL ABS: 370 /uL — AB (ref 400–2700)

## 2016-10-17 LAB — HIV-1 RNA QUANT-NO REFLEX-BLD
HIV 1 RNA Quant: <20 copies/mL
HIV-1 RNA Quant, Log: <1.3 Log copies/mL

## 2016-11-03 ENCOUNTER — Other Ambulatory Visit: Payer: Self-pay | Admitting: Internal Medicine

## 2016-11-03 DIAGNOSIS — B20 Human immunodeficiency virus [HIV] disease: Secondary | ICD-10-CM

## 2016-11-16 ENCOUNTER — Encounter: Payer: Self-pay | Admitting: Internal Medicine

## 2016-11-16 ENCOUNTER — Ambulatory Visit (INDEPENDENT_AMBULATORY_CARE_PROVIDER_SITE_OTHER): Payer: Medicare HMO | Admitting: Internal Medicine

## 2016-11-16 DIAGNOSIS — F1410 Cocaine abuse, uncomplicated: Secondary | ICD-10-CM | POA: Diagnosis not present

## 2016-11-16 DIAGNOSIS — B20 Human immunodeficiency virus [HIV] disease: Secondary | ICD-10-CM

## 2016-11-16 DIAGNOSIS — Z23 Encounter for immunization: Secondary | ICD-10-CM

## 2016-11-16 DIAGNOSIS — B182 Chronic viral hepatitis C: Secondary | ICD-10-CM

## 2016-11-16 MED ORDER — BICTEGRAVIR-EMTRICITAB-TENOFOV 50-200-25 MG PO TABS: 1.0000 | ORAL_TABLET | Freq: Every day | ORAL | 11 refills | Status: DC

## 2016-11-16 NOTE — Assessment & Plan Note (Signed)
I will repeat his hepatitis C viral load and fibrosis score. He has genotype 1A and will probably be a candidate for Harvoni therapy. He will follow-up with our pharmacist once his lab results are available.

## 2016-11-16 NOTE — Progress Notes (Signed)
Patient Active Problem List   Diagnosis Date Noted  . Human immunodeficiency virus (HIV) disease (HCC) 03/03/2006    Priority: High  . Chronic hepatitis C virus infection (HCC) 03/03/2006    Priority: Medium  . Chest pain 07/23/2016  . Cocaine abuse 04/14/2015  . Normocytic anemia 04/14/2015  . Thrombocytopenia (HCC) 04/14/2015  . Type 2 diabetes mellitus (HCC) 03/03/2006  . TOBACCO USER 03/03/2006  . Coronary atherosclerosis 03/03/2006    Patient's Medications  New Prescriptions   BICTEGRAVIR-EMTRICITABINE-TENOFOVIR AF (BIKTARVY) 50-200-25 MG TABS TABLET    Take 1 tablet by mouth daily.  Previous Medications   APIXABAN (ELIQUIS) 5 MG TABS TABLET    Take 1 tablet (5 mg total) by mouth 2 (two) times daily.   DILTIAZEM (CARDIZEM) 30 MG TABLET    Take 1 tablet (30 mg total) by mouth 3 (three) times daily.   GLIPIZIDE (GLUCOTROL XL) 10 MG 24 HR TABLET    TAKE 1 TABLET BY MOUTH ONCE DAILY.   HYDROCORTISONE VALERATE CREAM (WESTCORT) 0.2 %    Apply 1 application topically daily.   LIDOCAINE-PRILOCAINE (EMLA) CREAM    Apply 1 application topically daily.   NEOMYCIN-BACITRACIN-POLYMYXIN (NEOSPORIN) OINTMENT    Apply 1 application topically as needed (for rash). apply to eye   OMEGA-3 ACID ETHYL ESTERS (LOVAZA) 1 G CAPSULE    Take 1 capsule by mouth 2 (two) times daily.   PRAVASTATIN (PRAVACHOL) 20 MG TABLET    TAKE 1 TABLET BY MOUTH ONCE DAILY.  Modified Medications   No medications on file  Discontinued Medications   LAMIVUDINE-ZIDOVUDINE (COMBIVIR) 150-300 MG TABLET    TAKE 1 TABLET BY MOUTH TWICE DAILY.   NEVIRAPINE (VIRAMUNE) 200 MG TABLET    TAKE 1 TABLET BY MOUTH TWICE DAILY.    Subjective: Anthony Ferguson is in for his routine HIV follow-up visit. This is his first visit since October of last year. He has had no problems obtaining, taking or tolerating his Combivir and Viramune. He does not recall missing doses. He is worried because he does not he a working glucometer. He  has had trouble affording a new battery and test strips. He is now seeing Dr. Pearson Grippe in [place].   Review of Systems: Review of Systems  Constitutional: Negative for chills, diaphoresis, fever, malaise/fatigue and weight loss.  HENT: Negative for sore throat.   Respiratory: Negative for cough, sputum production and shortness of breath.   Cardiovascular: Negative for chest pain.  Gastrointestinal: Negative for abdominal pain, diarrhea, heartburn, nausea and vomiting.  Genitourinary: Negative for dysuria and frequency.  Musculoskeletal: Positive for back pain. Negative for joint pain and myalgias.  Skin: Negative for rash.  Neurological: Negative for dizziness and headaches.  Psychiatric/Behavioral: Negative for depression and substance abuse. The patient is not nervous/anxious.     Past Medical History:  Diagnosis Date  . HIV (human immunodeficiency virus infection) (HCC)   . Hypercholesteremia   . Hypertension   . Type 2 diabetes mellitus (HCC)     Social History  Substance Use Topics  . Smoking status: Current Every Day Smoker    Packs/day: 0.30    Types: Cigarettes  . Smokeless tobacco: Never Used     Comment: Trying to slow down  . Alcohol use 3.0 oz/week    5 Cans of beer per week     Comment: occas    Family History  Problem Relation Age of Onset  . Diabetes Mother   . Heart  failure Mother     Allergies  Allergen Reactions  . Hctz [Hydrochlorothiazide] Other (See Comments)    "ran my sugar up"  . Pork-Derived Products Palpitations and Hypertension    Objective:  Vitals:   11/16/16 1035  BP: (!) 143/79  Pulse: 64  Temp: (!) 97.5 F (36.4 C)  TempSrc: Oral  Weight: 170 lb (77.1 kg)  Height: 6' 3.25" (1.911 m)   Body mass index is 21.11 kg/m.  Physical Exam  Constitutional: He is oriented to person, place, and time.  He is in good spirits.  HENT:  Mouth/Throat: No oropharyngeal exudate.  Eyes: Conjunctivae are normal.  Cardiovascular:  Normal rate and regular rhythm.   No murmur heard. Pulmonary/Chest: Effort normal and breath sounds normal.  Abdominal: Soft. He exhibits no mass. There is no tenderness.  Musculoskeletal: Normal range of motion.  Neurological: He is alert and oriented to person, place, and time. Gait normal.  Skin: No rash noted.  Psychiatric: Mood and affect normal.    Lab Results Lab Results  Component Value Date   WBC 2.1 (L) 10/12/2016   HGB 13.5 10/12/2016   HCT 38.9 10/12/2016   MCV 109.3 (H) 10/12/2016   PLT 99 (L) 10/12/2016    Lab Results  Component Value Date   CREATININE 0.92 10/12/2016   BUN 15 10/12/2016   NA 140 10/12/2016   K 4.7 10/12/2016   CL 104 10/12/2016   CO2 25 10/12/2016    Lab Results  Component Value Date   ALT 37 10/12/2016   AST 58 (H) 10/12/2016   ALKPHOS 103 10/12/2016   BILITOT 0.7 10/12/2016    Lab Results  Component Value Date   CHOL 183 12/20/2015   HDL 130 12/20/2015   LDLCALC 37 12/20/2015   TRIG 80 12/20/2015   CHOLHDL 1.4 12/20/2015   Lab Results  Component Value Date   LABRPR NON REAC 12/20/2015   HIV 1 RNA Quant (copies/mL)  Date Value  10/12/2016 <20 NOT DETECTED  12/20/2015 <20  02/01/2015 <20   CD4 T Cell Abs (/uL)  Date Value  10/12/2016 370 (L)  12/20/2015 550  02/01/2015 360 (L)     Problem List Items Addressed This Visit      High   Human immunodeficiency virus (HIV) disease (HCC)    His infection remains under very good long-term control. I was able to convince him today to switch to switch to a simplified 1 pill a day regimen. I will change him to Biktarvy 1 daily.      Relevant Medications   bictegravir-emtricitabine-tenofovir AF (BIKTARVY) 50-200-25 MG TABS tablet     Medium   Chronic hepatitis C virus infection (HCC)    I will repeat his hepatitis C viral load and fibrosis score. He has genotype 1A and will probably be a candidate for Harvoni therapy. He will follow-up with our pharmacist once his lab results  are available.      Relevant Medications   bictegravir-emtricitabine-tenofovir AF (BIKTARVY) 50-200-25 MG TABS tablet   Other Relevant Orders   Liver Fibrosis, FibroTest-ActiTest   Hepatitis C RNA quantitative     Unprioritized   Cocaine abuse    He denies any drug use over the past year. He graduated him on his sobriety.       Other Visit Diagnoses    Need for immunization against influenza       Relevant Orders   Flu Vaccine QUAD 36+ mos IM (Completed)  Cliffton Asters, MD [facility] [phone] pager   [phone] cell 11/16/2016, 11:17 AM

## 2016-11-16 NOTE — Assessment & Plan Note (Signed)
He denies any drug use over the past year. He graduated him on his sobriety.

## 2016-11-16 NOTE — Assessment & Plan Note (Signed)
His infection remains under very good long-term control. I was able to convince him today to switch to switch to a simplified 1 pill a day regimen. I will change him to Biktarvy 1 daily.

## 2016-11-18 LAB — HEPATITIS C RNA QUANTITATIVE
HCV QUANT LOG: 6.34 {Log_IU}/mL — AB
HCV RNA, PCR, QN: 2190000 IU/mL — ABNORMAL HIGH

## 2016-11-20 LAB — LIVER FIBROSIS, FIBROTEST-ACTITEST
ALT: 43 U/L (ref 9–46)
Alpha-2-Macroglobulin: 439 mg/dL — ABNORMAL HIGH (ref 106–279)
Apolipoprotein A1: 235 mg/dL — ABNORMAL HIGH (ref 94–176)
Bilirubin: 0.7 mg/dL (ref 0.2–1.2)
Fibrosis Score: 0.81
GGT: 251 U/L — ABNORMAL HIGH (ref 3–70)
HAPTOGLOBIN: 51 mg/dL (ref 43–212)
NECROINFLAMMAT ACT SCORE: 0.41
REFERENCE ID: 2135598

## 2016-11-21 ENCOUNTER — Telehealth: Payer: Self-pay | Admitting: Pharmacist

## 2016-11-21 ENCOUNTER — Other Ambulatory Visit: Payer: Self-pay | Admitting: Pharmacist

## 2016-11-21 ENCOUNTER — Other Ambulatory Visit: Payer: Self-pay | Admitting: Internal Medicine

## 2016-11-21 DIAGNOSIS — I25110 Atherosclerotic heart disease of native coronary artery with unstable angina pectoris: Secondary | ICD-10-CM

## 2016-11-21 NOTE — Telephone Encounter (Signed)
Spoke with [name] today to review his medication list and verify what medications he was taking to ensure no drug interactions with potential Hepatitis C therapies. Relayed the information that we would be getting back to him about his Hepatitis C treatment once a decision was made about his treatment.

## 2016-11-23 ENCOUNTER — Other Ambulatory Visit: Payer: Self-pay | Admitting: Pharmacist

## 2016-11-23 DIAGNOSIS — B182 Chronic viral hepatitis C: Secondary | ICD-10-CM

## 2016-11-23 MED ORDER — SOFOSBUVIR-VELPATASVIR 400-100 MG PO TABS: 1.0000 | ORAL_TABLET | Freq: Every day | ORAL | 5 refills | Status: DC

## 2016-11-27 ENCOUNTER — Encounter: Payer: Self-pay | Admitting: Pharmacy Technician

## 2016-11-27 ENCOUNTER — Other Ambulatory Visit: Payer: Self-pay | Admitting: Pharmacist

## 2016-11-27 MED FILL — EPCLUSA 400 MG-100 MG TAB: 400-100 | 28 days supply | Qty: 28 | Fill #0

## 2016-12-20 ENCOUNTER — Ambulatory Visit: Payer: Medicare HMO

## 2016-12-22 ENCOUNTER — Telehealth: Payer: Self-pay | Admitting: Pharmacist Clinician (PhC)/ Clinical Pharmacy Specialist

## 2016-12-22 NOTE — Telephone Encounter (Signed)
He missed an appt with us the other day. Couldn't leave a VM on one number and the other one was busy.

## 2017-01-01 ENCOUNTER — Telehealth: Payer: Self-pay | Admitting: Pharmacist Clinician (PhC)/ Clinical Pharmacy Specialist

## 2017-01-01 MED FILL — EPCLUSA 400 MG-100 MG TAB: 400-100 | 28 days supply | Qty: 28 | Fill #1

## 2017-01-01 NOTE — Telephone Encounter (Signed)
Anthony Ferguson recently missed an appt with us so we can do labs. Both me and the pharmacy have been trying to reach him for refills and appts. He has missed about 3 days of Epclusa already because he is out of meds. [place] in [place] so transportation is a big issue. We are overnighting the meds to him today. He will set up transportation this week and call us back for an appt. He is also childpugh class B.

## 2017-01-09 ENCOUNTER — Ambulatory Visit (INDEPENDENT_AMBULATORY_CARE_PROVIDER_SITE_OTHER): Payer: Medicare HMO | Admitting: Pharmacist Clinician (PhC)/ Clinical Pharmacy Specialist

## 2017-01-09 DIAGNOSIS — B182 Chronic viral hepatitis C: Secondary | ICD-10-CM | POA: Diagnosis not present

## 2017-01-09 LAB — CBC
HEMATOCRIT: 38.6 % (ref 38.5–50.0)
HEMOGLOBIN: 13.7 g/dL (ref 13.2–17.1)
MCH: 34.4 pg — ABNORMAL HIGH (ref 27.0–33.0)
MCHC: 35.5 g/dL (ref 32.0–36.0)
MCV: 97 fL (ref 80.0–100.0)
MPV: 11.3 fL (ref 7.5–12.5)
Platelets: 101 10*3/uL — ABNORMAL LOW (ref 140–400)
RBC: 3.98 10*6/uL — AB (ref 4.20–5.80)
RDW: 12.8 % (ref 11.0–15.0)
WBC: 2.6 10*3/uL — AB (ref 3.8–10.8)

## 2017-01-09 LAB — COMPREHENSIVE METABOLIC PANEL
AG Ratio: 1 (calc) (ref 1.0–2.5)
ALBUMIN MSPROF: 3.7 g/dL (ref 3.6–5.1)
ALKALINE PHOSPHATASE (APISO): 89 U/L (ref 40–115)
ALT: 20 U/L (ref 9–46)
AST: 31 U/L (ref 10–35)
BUN: 16 mg/dL (ref 7–25)
CO2: 30 mmol/L (ref 20–32)
CREATININE: 0.93 mg/dL (ref 0.70–1.25)
Calcium: 9.6 mg/dL (ref 8.6–10.3)
Chloride: 101 mmol/L (ref 98–110)
Globulin: 3.7 g/dL (calc) (ref 1.9–3.7)
Glucose, Bld: 242 mg/dL — ABNORMAL HIGH (ref 65–99)
POTASSIUM: 5 mmol/L (ref 3.5–5.3)
Sodium: 137 mmol/L (ref 135–146)
TOTAL PROTEIN: 7.4 g/dL (ref 6.1–8.1)
Total Bilirubin: 0.6 mg/dL (ref 0.2–1.2)

## 2017-01-09 NOTE — Progress Notes (Addendum)
HPI: Anthony Ferguson is a 66 y.o. male who presents to the clinic today for his hepatitis C follow-up. 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   Lab Results  Component Value Date   HCVGENOTYPE 1a 08/19/2013    Allergies: Allergies  Allergen Reactions  . Hctz [Hydrochlorothiazide] Other (See Comments)    "ran my sugar up"  . Pork-Derived Products Palpitations and Hypertension    Vitals:    Past Medical History: Past Medical History:  Diagnosis Date  . HIV (human immunodeficiency virus infection) (HCC)   . Hypercholesteremia   . Hypertension   . Type 2 diabetes mellitus (HCC)     Social History: Social History   Socioeconomic History  . Marital status: Legally Separated    Spouse name: Not on file  . Number of children: Not on file  . Years of education: Not on file  . Highest education level: Not on file  Social Needs  . Financial resource strain: Not on file  . Food insecurity - worry: Not on file  . Food insecurity - inability: Not on file  . Transportation needs - medical: Not on file  . Transportation needs - non-medical: Not on file  Occupational History  . Not on file  Tobacco Use  . Smoking status: Current Every Day Smoker    Packs/day: 0.30    Types: Cigarettes  . Smokeless tobacco: Never Used  . Tobacco comment: Trying to slow down  Substance and Sexual Activity  . Alcohol use: Yes    Alcohol/week: 3.0 oz    Types: 5 Cans of beer per week    Comment: occas  . Drug use: No    Comment: last use 07/22/16  . Sexual activity: Not Currently    Partners: Female    Comment: given condons  Other Topics Concern  . Not on file  Social History Narrative  . Not on file    Labs: HIV 1 RNA Quant (copies/mL)  Date Value  10/12/2016 <20 NOT DETECTED  12/20/2015 <20  02/01/2015 <20   CD4 T Cell Abs (/uL)  Date Value  10/12/2016 370 (L)  12/20/2015 550  02/01/2015 360 (L)   Hep B S Ab (no units)  Date Value   08/19/2013 INDETER (A)   Hepatitis B Surface Ag (no units)  Date Value  08/19/2013 NEGATIVE   HCV Ab (no units)  Date Value  04/16/2006 Yes    Lab Results  Component Value Date   HCVGENOTYPE 1a 08/19/2013    Hepatitis C RNA quantitative Latest Ref Rng & Units 11/16/2016 12/20/2015 02/01/2015 08/19/2013  HCV Quantitative <15 IU/mL - 1,610,960(A1,675,295(H) 5,409,811(B1,632,320(H) 1,478,295(A1,289,802(H)  HCV Quantitative Log NOT DETECT Log IU/mL 6.34(H) 6.22(H) 6.21(H) 6.11(H)    AST (U/L)  Date Value  10/12/2016 58 (H)  07/24/2016 421 (H)  12/20/2015 50 (H)   ALT (U/L)  Date Value  11/16/2016 43  10/12/2016 37  07/24/2016 81 (H)  12/20/2015 38  02/01/2015 65 (H)   INR (no units)  Date Value  12/20/2015 1.1  08/19/2013 1.10    CrCl: CrCl cannot be calculated (Patient's most recent lab result is older than the maximum 21 days allowed.).  Fibrosis Score: F4  as assessed by Fibrosure  Child-Pugh Score: B   Assessment: Anthony Ferguson is here to follow-up on his Hepatitis C disease after missing a recent appointment for labs. Patient is currently on his second bottle of Epclusa and has been tolerating it well. He did miss about  3 days worth of medication earlier in the month because of some confusion with his refills. He was able to get his second bottle and has not missed any doses since then. Instructed him to call the pharmacy when he has about 7 tablets left and to then call us if he encounters any issues.   We will re-check his viral load today as well as a CMET and CBC. We plan to treat Anthony Ferguson with 6 months of Epclusa due to his decompensated cirrhosis.   He does have a minor drug interaction with the Epclusa and his Apixaban. He started the apixaban in June after having runs of afib [facility]. Epclusa can slightly raise levels of apixaban so we will monitor his CBC closely. We told him to report any signs or symptoms of bleeding.   Recommendations: - HCV viral load, CBC and CMET today -  Continue taking Epclusa every day -  F/U with pharmacy on 03/07/17 -  Watch closely for bleeding   Della GooEmily S Sinclair, Pharm.D. PGY2 Infectious Diseases Pharmacy Resident  [facility] 01/09/2017, 10:29 AM   Anthony Ferguson missed about 3 days of meds due to refill. Otherwise, he has been compliance with therapy. Due to his childpugh B, we will use 24 wks of Epclusa. Due to the interaction issue with his apixaban, we will have to monitor it a little closer. He has no active bleeding.   Ulyses SouthwardMinh Topacio Cella, PharmD, BCPS, AAHIVP, CPP Infectious Disease Pharmacist Pager: [phone] 01/09/2017 3:55 PM

## 2017-01-12 LAB — HEPATITIS C RNA QUANTITATIVE
HCV Quantitative Log: <1.18 Log IU/mL — AB
HCV RNA, PCR, QN: <15 IU/mL — AB

## 2017-01-17 ENCOUNTER — Other Ambulatory Visit: Payer: Self-pay | Admitting: Internal Medicine

## 2017-01-18 ENCOUNTER — Other Ambulatory Visit: Payer: Self-pay | Admitting: Internal Medicine

## 2017-01-24 MED FILL — EPCLUSA 400 MG-100 MG TAB: 400-100 | 28 days supply | Qty: 28 | Fill #2

## 2017-01-30 ENCOUNTER — Other Ambulatory Visit: Payer: Self-pay | Admitting: Pharmacist

## 2017-02-21 ENCOUNTER — Other Ambulatory Visit: Payer: Self-pay | Admitting: Internal Medicine

## 2017-02-21 DIAGNOSIS — I25110 Atherosclerotic heart disease of native coronary artery with unstable angina pectoris: Secondary | ICD-10-CM

## 2017-02-21 MED FILL — EPCLUSA 400 MG-100 MG TAB: 400-100 | 28 days supply | Qty: 28 | Fill #3

## 2017-03-07 ENCOUNTER — Ambulatory Visit: Payer: Medicare HMO

## 2017-03-14 ENCOUNTER — Ambulatory Visit (INDEPENDENT_AMBULATORY_CARE_PROVIDER_SITE_OTHER): Payer: Medicare HMO | Admitting: Pharmacist Clinician (PhC)/ Clinical Pharmacy Specialist

## 2017-03-14 VITALS — Wt 168.0 lb

## 2017-03-14 DIAGNOSIS — B182 Chronic viral hepatitis C: Secondary | ICD-10-CM | POA: Diagnosis not present

## 2017-03-14 DIAGNOSIS — B20 Human immunodeficiency virus [HIV] disease: Secondary | ICD-10-CM | POA: Diagnosis not present

## 2017-03-14 LAB — CBC
HEMATOCRIT: 44 % (ref 38.5–50.0)
HEMOGLOBIN: 15.1 g/dL (ref 13.2–17.1)
MCH: 31.9 pg (ref 27.0–33.0)
MCHC: 34.3 g/dL (ref 32.0–36.0)
MCV: 92.8 fL (ref 80.0–100.0)
MPV: 11.3 fL (ref 7.5–12.5)
Platelets: 119 10*3/uL — ABNORMAL LOW (ref 140–400)
RBC: 4.74 10*6/uL (ref 4.20–5.80)
RDW: 12 % (ref 11.0–15.0)
WBC: 3 10*3/uL — AB (ref 3.8–10.8)

## 2017-03-14 MED ORDER — GLIPIZIDE ER 10 MG PO TB24: 10.0000 mg | ORAL_TABLET | Freq: Every day | ORAL | 3 refills | Status: DC

## 2017-03-14 MED ORDER — GLUCOSE BLOOD VI STRP: ORAL_STRIP | 12 refills | Status: AC

## 2017-03-14 NOTE — Progress Notes (Signed)
HPI: Anthony Ferguson is a 67 y.o. male who is here to f/u with pharmacy for his hep C and HIV adherence.   Allergies: Allergies  Allergen Reactions  . Hctz [Hydrochlorothiazide] Other (See Comments)    "ran my sugar up"  . Pork-Derived Products Palpitations and Hypertension    Vitals:    Past Medical History: Past Medical History:  Diagnosis Date  . HIV (human immunodeficiency virus infection) (HCC)   . Hypercholesteremia   . Hypertension   . Type 2 diabetes mellitus (HCC)     Social History: Social History   Socioeconomic History  . Marital status: Legally Separated    Spouse name: Not on file  . Number of children: Not on file  . Years of education: Not on file  . Highest education level: Not on file  Social Needs  . Financial resource strain: Not on file  . Food insecurity - worry: Not on file  . Food insecurity - inability: Not on file  . Transportation needs - medical: Not on file  . Transportation needs - non-medical: Not on file  Occupational History  . Not on file  Tobacco Use  . Smoking status: Current Every Day Smoker    Packs/day: 0.30    Types: Cigarettes  . Smokeless tobacco: Never Used  . Tobacco comment: Trying to slow down  Substance and Sexual Activity  . Alcohol use: Yes    Alcohol/week: 3.0 oz    Types: 5 Cans of beer per week    Comment: occas  . Drug use: No    Comment: last use 07/22/16  . Sexual activity: Not Currently    Partners: Female    Comment: given condons  Other Topics Concern  . Not on file  Social History Narrative  . Not on file    Previous Regimen:   Current Regimen: Biktarvy, Epclusa  Labs: HIV 1 RNA Quant (copies/mL)  Date Value  10/12/2016 <20 NOT DETECTED  12/20/2015 <20  02/01/2015 <20   CD4 T Cell Abs (/uL)  Date Value  10/12/2016 370 (L)  12/20/2015 550  02/01/2015 360 (L)   Hep B S Ab (no units)  Date Value  08/19/2013 INDETER (A)   Hepatitis B Surface Ag (no units)  Date Value  08/19/2013  NEGATIVE   HCV Ab (no units)  Date Value  04/16/2006 Yes    CrCl: CrCl cannot be calculated (Patient's most recent lab result is older than the maximum 21 days allowed.).  Lipids:    Component Value Date/Time   CHOL 183 12/20/2015 1624   TRIG 80 12/20/2015 1624   HDL 130 12/20/2015 1624   CHOLHDL 1.4 12/20/2015 1624   VLDL 16 12/20/2015 1624   LDLCALC 37 12/20/2015 1624    Assessment: Anthony Ferguson started on his 24 wks of Epclusa on 10/10. [facility] has been mailing it to him. He has childpugh class B due to his low platelets. He saw me in November and his hep C VL was undetectable at the time. He also takes apixaban for his afib. He is out of his apixaban and diltiazem. He is an appt this afternoon with the cardiologist and he will ask for more refills there. He also ran out of refills for his Glipizide. He has been follow by Dr. Selena Batten. Told him that we will give him enough to bridge him but he must f/u with Dr. Selena Batten for his DM. Rx was sent to [facility].   He is on about the 4th month now.  Set him up for a f/u with Dr. Orvan Falconerampbell in April. This will be his EOT visit for hep C. Fortunately, he has done well on his hep C med and has not missed any doses of his HIV and hep C meds. No issue with bleed with the apixaban. He said that he keeps an eye out for blood in his stool.   Recommendations:  Continue Epclusa x 24 wks Hep C VL, CBC, HIV VL today EOT visit with Dr. Orvan Falconerampbell in April  Anthony Ferguson, PharmD, BCPS, AAHIVP, CPP Clinical Infectious Disease Pharmacist [facility] 03/14/2017, 11:36 AM

## 2017-03-14 NOTE — Patient Instructions (Signed)
Come back and see Dr. Orvan Falconerampbell on April 2

## 2017-03-19 MED FILL — EPCLUSA 400 MG-100 MG TAB: 400-100 | 28 days supply | Qty: 28 | Fill #4

## 2017-03-22 ENCOUNTER — Other Ambulatory Visit (HOSPITAL_COMMUNITY): Payer: Self-pay | Admitting: Family Medicine

## 2017-03-22 DIAGNOSIS — Z79899 Other long term (current) drug therapy: Secondary | ICD-10-CM

## 2017-03-23 LAB — HIV-1 RNA QUANT-NO REFLEX-BLD
HIV 1 RNA QUANT: NOT DETECTED {copies}/mL
HIV-1 RNA Quant, Log: <1.3 Log copies/mL

## 2017-03-23 LAB — HEPATITIS C RNA QUANTITATIVE
HCV QUANT LOG: NOT DETECTED {Log_IU}/mL
HCV RNA, PCR, QN: NOT DETECTED [IU]/mL

## 2017-03-23 LAB — TEST AUTHORIZATION

## 2017-03-28 ENCOUNTER — Other Ambulatory Visit (HOSPITAL_COMMUNITY): Payer: Self-pay | Admitting: Family Medicine

## 2017-03-28 DIAGNOSIS — E049 Nontoxic goiter, unspecified: Secondary | ICD-10-CM

## 2017-04-04 ENCOUNTER — Encounter (HOSPITAL_COMMUNITY): Payer: Self-pay

## 2017-04-04 ENCOUNTER — Other Ambulatory Visit (HOSPITAL_COMMUNITY): Payer: Medicare HMO

## 2017-04-11 ENCOUNTER — Ambulatory Visit (HOSPITAL_COMMUNITY): Payer: Medicare HMO | Attending: Family Medicine

## 2017-04-25 MED FILL — EPCLUSA 400 MG-100 MG TAB: 400-100 | 28 days supply | Qty: 28 | Fill #5

## 2017-05-17 ENCOUNTER — Other Ambulatory Visit: Payer: Self-pay | Admitting: Pharmacist

## 2017-05-17 DIAGNOSIS — B182 Chronic viral hepatitis C: Secondary | ICD-10-CM

## 2017-05-18 ENCOUNTER — Other Ambulatory Visit: Payer: Self-pay | Admitting: Pharmacist

## 2017-05-18 DIAGNOSIS — B182 Chronic viral hepatitis C: Secondary | ICD-10-CM

## 2017-05-22 ENCOUNTER — Ambulatory Visit (INDEPENDENT_AMBULATORY_CARE_PROVIDER_SITE_OTHER): Payer: Medicare HMO | Admitting: Internal Medicine

## 2017-05-22 DIAGNOSIS — B182 Chronic viral hepatitis C: Secondary | ICD-10-CM

## 2017-05-22 DIAGNOSIS — B20 Human immunodeficiency virus [HIV] disease: Secondary | ICD-10-CM | POA: Diagnosis not present

## 2017-05-22 NOTE — Assessment & Plan Note (Signed)
He has done very well with therapy for his chronic hepatitis C.  His last 2 viral loads were undetectable.  I will check an end of therapy viral load today and see him back in 3 months.

## 2017-05-22 NOTE — Progress Notes (Signed)
Patient Active Problem List   Diagnosis Date Noted  . Human immunodeficiency virus (HIV) disease (HCC) 03/03/2006    Priority: High  . Chronic hepatitis C virus infection (HCC) 03/03/2006    Priority: Medium  . Chest pain 07/23/2016  . Cocaine abuse (HCC) 04/14/2015  . Normocytic anemia 04/14/2015  . Thrombocytopenia (HCC) 04/14/2015  . Type 2 diabetes mellitus (HCC) 03/03/2006  . TOBACCO USER 03/03/2006  . Coronary atherosclerosis 03/03/2006    Patient's Medications  New Prescriptions   No medications on file  Previous Medications   APIXABAN (ELIQUIS) 5 MG TABS TABLET    Take 1 tablet (5 mg total) by mouth 2 (two) times daily.   BICTEGRAVIR-EMTRICITABINE-TENOFOVIR AF (BIKTARVY) 50-200-25 MG TABS TABLET    Take 1 tablet by mouth daily.   DILTIAZEM (CARDIZEM) 30 MG TABLET    Take 1 tablet (30 mg total) by mouth 3 (three) times daily.   GLIPIZIDE (GLUCOTROL XL) 10 MG 24 HR TABLET    Take 1 tablet (10 mg total) by mouth daily with breakfast.   GLUCOSE BLOOD TEST STRIP    Use as instructed   LIDOCAINE-PRILOCAINE (EMLA) CREAM    Apply 1 application topically daily.   OMEGA-3 ACID ETHYL ESTERS (LOVAZA) 1 G CAPSULE    Take 1 capsule by mouth 2 (two) times daily.   PRAVASTATIN (PRAVACHOL) 20 MG TABLET    TAKE 1 TABLET BY MOUTH ONCE DAILY.   SOFOSBUVIR-VELPATASVIR (EPCLUSA) 400-100 MG TABS    Take 1 tablet by mouth daily.  Modified Medications   No medications on file  Discontinued Medications   No medications on file    Subjective: Anthony Ferguson is in for his routine HIV follow-up visit.  He has not had any problems obtaining, taking or tolerating his [place] or Epclusa.  He takes them each morning.  He recalls missing only one dose since his last visit.  He says he only has 4 more tablets of Epclusa finished his 24 weeks of therapy.  He is feeling well.  His appetite is very good and he has gained some weight.  He attributes that to not getting as much exercise over the cold,  wet winter.  He hopes to get out and walk and fish more in the coming months.  Review of Systems: Review of Systems  Constitutional: Negative for chills, fever, malaise/fatigue and weight loss.  Respiratory: Negative for cough, sputum production and shortness of breath.   Cardiovascular: Negative for chest pain.  Gastrointestinal: Negative for abdominal pain, diarrhea, nausea and vomiting.  Musculoskeletal: Negative for back pain.  Psychiatric/Behavioral: Negative for depression and substance abuse.    Past Medical History:  Diagnosis Date  . HIV (human immunodeficiency virus infection) (HCC)   . Hypercholesteremia   . Hypertension   . Type 2 diabetes mellitus (HCC)     Social History   Tobacco Use  . Smoking status: Current Every Day Smoker    Packs/day: 0.30    Types: Cigarettes  . Smokeless tobacco: Never Used  . Tobacco comment: Trying to slow down  Substance Use Topics  . Alcohol use: Yes    Alcohol/week: 3.0 oz    Types: 5 Cans of beer per week    Comment: occas  . Drug use: No    Types: Cocaine    Comment: last use 07/22/16    Family History  Problem Relation Age of Onset  . Diabetes Mother   . Heart failure Mother  Allergies  Allergen Reactions  . Hctz [Hydrochlorothiazide] Other (See Comments)    "ran my sugar up"  . Pork-Derived Products Palpitations and Hypertension    Health Maintenance  Topic Date Due  . FOOT EXAM  09/26/1958  . OPHTHALMOLOGY EXAM  09/26/1958  . URINE MICROALBUMIN  09/26/1958  . TETANUS/TDAP  09/26/1967  . COLONOSCOPY  09/26/1998  . HEMOGLOBIN A1C  10/16/2011  . PNA vac Low Risk Adult (1 of 2 - PCV13) 09/25/2013  . INFLUENZA VACCINE  09/20/2017  . Hepatitis C Screening  Completed    Objective:  Vitals:   05/22/17 1020  BP: (!) 138/93  Pulse: 76  Temp: (!) 97.3 F (36.3 C)  Weight: 178 lb (80.7 kg)  Height: 6\' 3"  (1.905 m)   Body mass index is 22.25 kg/m.  Physical Exam  Constitutional: He is oriented to  person, place, and time.  HENT:  Mouth/Throat: No oropharyngeal exudate.  Eyes: Conjunctivae are normal.  Cardiovascular: Normal rate and regular rhythm.  No murmur heard. Pulmonary/Chest: Effort normal and breath sounds normal.  Abdominal: Soft. He exhibits no mass. There is no tenderness.  Musculoskeletal: Normal range of motion.  Neurological: He is alert and oriented to person, place, and time.  Skin: No rash noted.  Psychiatric: Mood and affect normal.    Lab Results Lab Results  Component Value Date   WBC 3.0 (L) 03/14/2017   HGB 15.1 03/14/2017   HCT 44.0 03/14/2017   MCV 92.8 03/14/2017   PLT 119 (L) 03/14/2017    Lab Results  Component Value Date   CREATININE 0.93 01/09/2017   BUN 16 01/09/2017   NA 137 01/09/2017   K 5.0 01/09/2017   CL 101 01/09/2017   CO2 30 01/09/2017    Lab Results  Component Value Date   ALT 20 01/09/2017   AST 31 01/09/2017   ALKPHOS 103 10/12/2016   BILITOT 0.6 01/09/2017    Lab Results  Component Value Date   CHOL 183 12/20/2015   HDL 130 12/20/2015   LDLCALC 37 12/20/2015   TRIG 80 12/20/2015   CHOLHDL 1.4 12/20/2015   Lab Results  Component Value Date   LABRPR NON REAC 12/20/2015   HIV 1 RNA Quant (copies/mL)  Date Value  03/14/2017 <20 NOT DETECTED  10/12/2016 <20 NOT DETECTED  12/20/2015 <20   CD4 T Cell Abs (/uL)  Date Value  10/12/2016 370 (L)  12/20/2015 550  02/01/2015 360 (L)     Problem List Items Addressed This Visit      High   Human immunodeficiency virus (HIV) disease (HCC)    Adherence has been very good and his infection is under excellent, long-term control.  I will check blood work today and see him back in 3 months.      Relevant Orders   T-helper cell (CD4)- ([facility] clinic only)   HIV 1 RNA quant-no reflex-bld   CBC   Comprehensive metabolic panel   RPR   Lipid panel     Medium   Chronic hepatitis C virus infection (HCC)    He has done very well with therapy for his chronic hepatitis  C.  His last 2 viral loads were undetectable.  I will check an end of therapy viral load today and see him back in 3 months.      Relevant Orders   Hepatitis C RNA quantitative        Cliffton Asters, MD [facility] [phone]  pager   [phone] cell 05/22/2017, 10:31 AM

## 2017-05-22 NOTE — Assessment & Plan Note (Signed)
Adherence has been very good and his infection is under excellent, long-term control.  I will check blood work today and see him back in 3 months.

## 2017-05-23 LAB — LIPID PANEL
CHOL/HDL RATIO: 2.1 (calc) (ref <5.0)
Cholesterol: 184 mg/dL (ref <200)
HDL: 88 mg/dL (ref >40)
LDL Cholesterol (Calc): 81 mg/dL (calc)
Non-HDL Cholesterol (Calc): 96 mg/dL (calc) (ref <130)
TRIGLYCERIDES: 69 mg/dL (ref <150)

## 2017-05-23 LAB — COMPREHENSIVE METABOLIC PANEL
AG Ratio: 1.1 (calc) (ref 1.0–2.5)
ALBUMIN MSPROF: 4 g/dL (ref 3.6–5.1)
ALT: 17 U/L (ref 9–46)
AST: 27 U/L (ref 10–35)
Alkaline phosphatase (APISO): 78 U/L (ref 40–115)
BILIRUBIN TOTAL: 0.8 mg/dL (ref 0.2–1.2)
BUN: 19 mg/dL (ref 7–25)
CALCIUM: 9.7 mg/dL (ref 8.6–10.3)
CO2: 27 mmol/L (ref 20–32)
CREATININE: 1.19 mg/dL (ref 0.70–1.25)
Chloride: 106 mmol/L (ref 98–110)
GLUCOSE: 172 mg/dL — AB (ref 65–99)
Globulin: 3.7 g/dL (calc) (ref 1.9–3.7)
POTASSIUM: 4.6 mmol/L (ref 3.5–5.3)
SODIUM: 133 mmol/L — AB (ref 135–146)
TOTAL PROTEIN: 7.7 g/dL (ref 6.1–8.1)

## 2017-05-23 LAB — CBC
HCT: 40.6 % (ref 38.5–50.0)
Hemoglobin: 14 g/dL (ref 13.2–17.1)
MCH: 31.7 pg (ref 27.0–33.0)
MCHC: 34.5 g/dL (ref 32.0–36.0)
MCV: 91.9 fL (ref 80.0–100.0)
MPV: 10.5 fL (ref 7.5–12.5)
PLATELETS: 131 10*3/uL — AB (ref 140–400)
RBC: 4.42 10*6/uL (ref 4.20–5.80)
RDW: 13 % (ref 11.0–15.0)
WBC: 3 10*3/uL — AB (ref 3.8–10.8)

## 2017-05-23 LAB — T-HELPER CELL (CD4) - (RCID CLINIC ONLY)
CD4 T CELL ABS: 510 /uL (ref 400–2700)
CD4 T CELL HELPER: 38 % (ref 33–55)

## 2017-05-23 LAB — RPR: RPR: NONREACTIVE

## 2017-05-24 LAB — HIV-1 RNA QUANT-NO REFLEX-BLD
HIV 1 RNA Quant: <20 copies/mL
HIV-1 RNA Quant, Log: <1.3 Log copies/mL

## 2017-05-24 LAB — HEPATITIS C RNA QUANTITATIVE
HCV QUANT LOG: NOT DETECTED {Log_IU}/mL
HCV RNA, PCR, QN: <15 IU/mL

## 2017-05-25 LAB — HIV-1 RNA QUANT-NO REFLEX-BLD
HIV 1 RNA QUANT: DETECTED {copies}/mL — AB
HIV-1 RNA Quant, Log: <1.3 Log copies/mL — AB

## 2017-05-25 LAB — HEPATITIS C RNA QUANTITATIVE
HCV Quantitative Log: <1.18 Log IU/mL
HCV RNA, PCR, QN: NOT DETECTED [IU]/mL

## 2017-05-28 ENCOUNTER — Other Ambulatory Visit: Payer: Self-pay | Admitting: Pharmacist

## 2017-09-04 ENCOUNTER — Encounter (HOSPITAL_COMMUNITY): Payer: Self-pay | Admitting: *Deleted

## 2017-09-04 ENCOUNTER — Emergency Department (HOSPITAL_COMMUNITY)
Admission: EM | Admit: 2017-09-04 | Discharge: 2017-09-04 | Disposition: A | Discharge status: Home / Self Care | Payer: Medicare HMO | Attending: Emergency Medicine | Admitting: Emergency Medicine

## 2017-09-04 ENCOUNTER — Other Ambulatory Visit: Payer: Self-pay

## 2017-09-04 ENCOUNTER — Ambulatory Visit: Payer: Medicare HMO | Admitting: Internal Medicine

## 2017-09-04 DIAGNOSIS — Z7901 Long term (current) use of anticoagulants: Secondary | ICD-10-CM | POA: Diagnosis not present

## 2017-09-04 DIAGNOSIS — B20 Human immunodeficiency virus [HIV] disease: Secondary | ICD-10-CM | POA: Insufficient documentation

## 2017-09-04 DIAGNOSIS — E119 Type 2 diabetes mellitus without complications: Secondary | ICD-10-CM | POA: Insufficient documentation

## 2017-09-04 DIAGNOSIS — I10 Essential (primary) hypertension: Secondary | ICD-10-CM | POA: Insufficient documentation

## 2017-09-04 DIAGNOSIS — M5441 Lumbago with sciatica, right side: Secondary | ICD-10-CM | POA: Diagnosis not present

## 2017-09-04 DIAGNOSIS — Z79899 Other long term (current) drug therapy: Secondary | ICD-10-CM | POA: Diagnosis not present

## 2017-09-04 DIAGNOSIS — F17210 Nicotine dependence, cigarettes, uncomplicated: Secondary | ICD-10-CM | POA: Diagnosis not present

## 2017-09-04 DIAGNOSIS — Z7984 Long term (current) use of oral hypoglycemic drugs: Secondary | ICD-10-CM | POA: Diagnosis not present

## 2017-09-04 DIAGNOSIS — M5136 Other intervertebral disc degeneration, lumbar region: Secondary | ICD-10-CM | POA: Diagnosis not present

## 2017-09-04 DIAGNOSIS — M5431 Sciatica, right side: Secondary | ICD-10-CM

## 2017-09-04 DIAGNOSIS — M545 Low back pain: Secondary | ICD-10-CM | POA: Diagnosis present

## 2017-09-04 MED ORDER — DEXAMETHASONE 4 MG PO TABS: 4.0000 mg | ORAL_TABLET | Freq: Two times a day (BID) | ORAL | 0 refills | Status: DC

## 2017-09-04 MED ORDER — CYCLOBENZAPRINE HCL 10 MG PO TABS: 10.0000 mg | ORAL_TABLET | Freq: Three times a day (TID) | ORAL | 0 refills | Status: DC

## 2017-09-04 MED ORDER — ONDANSETRON HCL 4 MG PO TABS
4.0000 mg | ORAL_TABLET | Freq: Once | ORAL | Status: AC
Start: 2017-09-04 — End: 2017-09-04
  Administered 2017-09-04: 4 mg via ORAL
  Filled 2017-09-04: qty 1

## 2017-09-04 MED ORDER — KETOROLAC TROMETHAMINE 30 MG/ML IJ SOLN
30.0000 mg | Freq: Once | INTRAMUSCULAR | Status: AC
  Administered 2017-09-04: 30 mg via INTRAMUSCULAR
  Filled 2017-09-04: qty 1

## 2017-09-04 MED ORDER — DIAZEPAM 5 MG PO TABS
10.0000 mg | ORAL_TABLET | Freq: Once | ORAL | Status: AC
  Administered 2017-09-04: 10 mg via ORAL
  Filled 2017-09-04: qty 2

## 2017-09-04 MED ORDER — DEXAMETHASONE SODIUM PHOSPHATE 10 MG/ML IJ SOLN
10.0000 mg | Freq: Once | INTRAMUSCULAR | Status: AC
  Administered 2017-09-04: 10 mg via INTRAMUSCULAR
  Filled 2017-09-04: qty 1

## 2017-09-04 NOTE — Discharge Instructions (Signed)
Review of your records reveals degenerative disc disease of multiple areas. Your exam suggest pain with movement and exam of the lower back and right lower extremity. Please see Dr Selena BattenKim or a member of his team as soon as possible for assistance with your pain management. Heating pad maybe helpful. Use flexeril and decadron daily until seen by Dr Elmyra RicksKim's office.

## 2017-09-04 NOTE — ED Provider Notes (Signed)
[facility] EMERGENCY DEPARTMENT Provider Note   CSN: [id number] Arrival date & time: 09/04/17  1020     History   Chief Complaint Chief Complaint  Patient presents with  . Back Pain    HPI Anthony Ferguson is a 67 y.o. male.  Patient is a 67 year old male who presents to the emergency department with a complaint of right lower back pain.  The patient states that he has had problems with his lower back for quite some time.  He states that it is worse now than it has been in the past.  The worsening started 3 days ago.  Patient is ambulatory with a cane usually, he states is more difficult to get around than usual.  He has not had any loss of bowel or bladder function.  He has not lost control of any of his extremities.  There is been no recent fall or accident reported.  He denies any difficulty with sensation in the saddle area.     Past Medical History:  Diagnosis Date  . HIV (human immunodeficiency virus infection) (HCC)   . Hypercholesteremia   . Hypertension   . Type 2 diabetes mellitus [facility])     Patient Active Problem List   Diagnosis Date Noted  . Chest pain 07/23/2016  . Cocaine abuse (HCC) 04/14/2015  . Normocytic anemia 04/14/2015  . Thrombocytopenia (HCC) 04/14/2015  . Human immunodeficiency virus (HIV) disease (HCC) 03/03/2006  . Chronic hepatitis C virus infection (HCC) 03/03/2006  . Type 2 diabetes mellitus (HCC) 03/03/2006  . TOBACCO USER 03/03/2006  . Coronary atherosclerosis 03/03/2006    Past Surgical History:  Procedure Laterality Date  . CARDIAC SURGERY    . CIRCUMCISION    . CORONARY ARTERY BYPASS GRAFT          Home Medications    Prior to Admission medications   Medication Sig Start Date End Date Taking? Authorizing Provider  apixaban (ELIQUIS) 5 MG TABS tablet Take 1 tablet (5 mg total) by mouth 2 (two) times daily. 07/24/16   Filbert Schilder, MD  bictegravir-emtricitabine-tenofovir AF (BIKTARVY) 50-200-25 MG TABS tablet Take 1  tablet by mouth daily. 11/16/16   Cliffton Asters, MD  diltiazem (CARDIZEM) 30 MG tablet Take 1 tablet (30 mg total) by mouth 3 (three) times daily. 07/24/16   Filbert Schilder, MD  glipiZIDE (GLUCOTROL XL) 10 MG 24 hr tablet Take 1 tablet (10 mg total) by mouth daily with breakfast. Patient not taking: Reported on 05/22/2017 03/14/17   Cliffton Asters, MD  glucose blood test strip Use as instructed 03/14/17   Cliffton Asters, MD  lidocaine-prilocaine (EMLA) cream Apply 1 application topically daily. 06/19/16   [provider]  omega-3 acid ethyl esters (LOVAZA) 1 g capsule Take 1 capsule by mouth 2 (two) times daily. 06/19/16   [provider]  pravastatin (PRAVACHOL) 20 MG tablet TAKE 1 TABLET BY MOUTH ONCE DAILY. 02/22/17   Cliffton Asters, MD  Sofosbuvir-Velpatasvir (EPCLUSA) 400-100 MG TABS Take 1 tablet by mouth daily. 11/23/16   Kuppelweiser, Cassie L, RPH-CPP    Family History Family History  Problem Relation Age of Onset  . Diabetes Mother   . Heart failure Mother     Social History Social History   Tobacco Use  . Smoking status: Current Every Day Smoker    Packs/day: 0.30    Types: Cigarettes  . Smokeless tobacco: Never Used  . Tobacco comment: Trying to slow down  Substance Use Topics  . Alcohol use: Yes  Alcohol/week: 3.0 oz    Types: 5 Cans of beer per week    Comment: occas  . Drug use: Not Currently    Types: Cocaine    Comment: last use 07/22/16     Allergies   Hctz [hydrochlorothiazide] and Pork-derived products   Review of Systems Review of Systems  Constitutional: Negative for activity change.       All ROS Neg except as noted in HPI  HENT: Negative for nosebleeds.   Eyes: Negative for photophobia and discharge.  Respiratory: Negative for cough, shortness of breath and wheezing.   Cardiovascular: Negative for chest pain and palpitations.  Gastrointestinal: Negative for abdominal pain and blood in stool.  Genitourinary: Negative for dysuria,  frequency and hematuria.  Musculoskeletal: Positive for back pain. Negative for arthralgias and neck pain.  Skin: Negative.   Neurological: Negative for dizziness, seizures and speech difficulty.  Psychiatric/Behavioral: Negative for confusion and hallucinations.     Physical Exam Updated Vital Signs BP (!) 158/99 (BP Location: Right Arm)   Pulse 84   Temp (!) 97.5 F (36.4 C) (Oral)   Resp 16   Ht 6\' 3"  (1.905 m)   Wt 77.1 kg (170 lb)   SpO2 100%   BMI 21.25 kg/m   Physical Exam  Constitutional: He is oriented to person, place, and time. He appears well-developed and well-nourished.  Non-toxic appearance.  HENT:  Head: Normocephalic.  Right Ear: Tympanic membrane and external ear normal.  Left Ear: Tympanic membrane and external ear normal.  Eyes: Pupils are equal, round, and reactive to light. EOM and lids are normal.  Neck: Normal range of motion. Neck supple. Carotid bruit is not present.  Cardiovascular: Normal rate, regular rhythm, normal heart sounds, intact distal pulses and normal pulses.  Pulmonary/Chest: Breath sounds normal. No respiratory distress.  Abdominal: Soft. Bowel sounds are normal. There is no tenderness. There is no guarding.  Musculoskeletal: Normal range of motion.       Lumbar back: He exhibits pain and spasm.       Back:  Lymphadenopathy:       Head (right side): No submandibular adenopathy present.       Head (left side): No submandibular adenopathy present.    He has no cervical adenopathy.  Neurological: He is alert and oriented to person, place, and time. He has normal strength. No cranial nerve deficit or sensory deficit.  Skin: Skin is warm and dry.  Psychiatric: He has a normal mood and affect. His speech is normal.  Nursing note and vitals reviewed.    ED Treatments / Results  Labs (all labs ordered are listed, but only abnormal results are displayed) Labs Reviewed - No data to display  EKG None  Radiology No results  found.  Procedures Procedures (including critical care time)  Medications Ordered in ED Medications  dexamethasone (DECADRON) injection 10 mg (has no administration in time range)  diazepam (VALIUM) tablet 10 mg (has no administration in time range)  ketorolac (TORADOL) 30 MG/ML injection 30 mg (has no administration in time range)  ondansetron (ZOFRAN) tablet 4 mg (has no administration in time range)     Initial Impression / Assessment and Plan / ED Course  I have reviewed the triage vital signs and the nursing notes.  Pertinent labs & imaging results that were available during my care of the patient were reviewed by me and considered in my medical decision making (see chart for details).       Final Clinical Impressions(s) /  ED Diagnoses MDM  Blood pressure is elevated at 162/94, otherwise vital signs within normal limits.  Pulse oximetry is 100% on room air.  Within normal limits by my interpretation.   I have reviewed the previous imaging studies.  Patient has extensive degenerative disc changes throughout the lumbar spine area.  There is no evidence of cauda equina on the examination today or other emergent changes or problems.  Patient was treated in the emergency department with steroid medication, muscle relaxer, and anti-inflammatory.  I have asked the patient to follow-up with Dr. Selena BattenKim or member of Dr. Elmyra RicksKim's team.  Patient will return to the emergency department if any emergent changes, problems, or concerns.   Final diagnoses:  Right sided sciatica  Degenerative lumbar disc    ED Discharge Orders        Ordered    dexamethasone (DECADRON) 4 MG tablet  2 times daily with meals     09/04/17 1200    cyclobenzaprine (FLEXERIL) 10 MG tablet  3 times daily     09/04/17 1200       Ivery QualeBryant, Donna Snooks, PA-C 09/04/17 1414    Donnetta Hutchingook, Brian, MD 09/05/17 1114

## 2017-09-04 NOTE — ED Triage Notes (Signed)
Pt c/o worsening chronic right lower back back that radiates down through hip, leg and into ankle, worsening since Saturday. Pt reports he was told he had a cracked hip many years ago but never had anything done about it. Pt is ambulatory with cane in triage.

## 2017-09-10 ENCOUNTER — Other Ambulatory Visit: Payer: Self-pay | Admitting: Internal Medicine

## 2017-09-10 DIAGNOSIS — I25110 Atherosclerotic heart disease of native coronary artery with unstable angina pectoris: Secondary | ICD-10-CM

## 2017-09-11 ENCOUNTER — Encounter: Payer: Self-pay | Admitting: Orthopaedic Surgery

## 2017-09-11 ENCOUNTER — Ambulatory Visit (INDEPENDENT_AMBULATORY_CARE_PROVIDER_SITE_OTHER): Payer: Medicare HMO | Admitting: Orthopaedic Surgery

## 2017-09-11 VITALS — BP 148/94 | HR 101 | Temp 98.4°F | Ht 75.0 in | Wt 175.0 lb

## 2017-09-11 DIAGNOSIS — G8929 Other chronic pain: Secondary | ICD-10-CM | POA: Diagnosis not present

## 2017-09-11 DIAGNOSIS — M5441 Lumbago with sciatica, right side: Secondary | ICD-10-CM | POA: Diagnosis not present

## 2017-09-11 MED ORDER — HYDROCODONE-ACETAMINOPHEN 5-325 MG PO TABS: ORAL_TABLET | ORAL | 0 refills | Status: DC

## 2017-09-11 MED ORDER — PREDNISONE 5 MG (21) PO TBPK: ORAL_TABLET | ORAL | 0 refills | Status: DC

## 2017-09-11 MED ORDER — TIZANIDINE HCL 4 MG PO TABS: ORAL_TABLET | ORAL | 0 refills | Status: DC

## 2017-09-12 NOTE — Progress Notes (Signed)
Subjective:    Patient ID: Anthony Ferguson, male    DOB: 05/02/1950, 67 y.o.   MRN: [id number]  HPI He has had lower back pain on and off for years. He went to the ER in 2017 and was evaluated for back pain.  He has had recurrence over the last few months with pain running down his right leg to the foot laterally.  He has buttock and hip pain as well.  He has no new trauma, no falls, no weakness, no bowel or bladder problems.  He has tried ice, heat, rest, Advil with little help.  He is not getting any better.  I have reviewed old records today.   Review of Systems  Constitutional: Positive for activity change.  Musculoskeletal: Positive for arthralgias, back pain and gait problem.   For Review of Systems, all other systems reviewed and are negative.  Past Medical History:  Diagnosis Date  . HIV (human immunodeficiency virus infection) (HCC)   . Hypercholesteremia   . Hypertension   . Type 2 diabetes mellitus (HCC)     Past Surgical History:  Procedure Laterality Date  . CARDIAC SURGERY    . CIRCUMCISION    . CORONARY ARTERY BYPASS GRAFT      Current Outpatient Medications on File Prior to Visit  Medication Sig Dispense Refill  . apixaban (ELIQUIS) 5 MG TABS tablet Take 1 tablet (5 mg total) by mouth 2 (two) times daily. 60 tablet 0  . bictegravir-emtricitabine-tenofovir AF (BIKTARVY) 50-200-25 MG TABS tablet Take 1 tablet by mouth daily. 30 tablet 11  . cyclobenzaprine (FLEXERIL) 10 MG tablet Take 1 tablet (10 mg total) by mouth 3 (three) times daily. 20 tablet 0  . dexamethasone (DECADRON) 4 MG tablet Take 1 tablet (4 mg total) by mouth 2 (two) times daily with a meal. 10 tablet 0  . diltiazem (CARDIZEM) 30 MG tablet Take 1 tablet (30 mg total) by mouth 3 (three) times daily. 90 tablet 0  . glipiZIDE (GLUCOTROL XL) 10 MG 24 hr tablet Take 1 tablet (10 mg total) by mouth daily with breakfast. 30 tablet 3  . glucose blood test strip Use as instructed 100 each 12  .  lidocaine-prilocaine (EMLA) cream Apply 1 application topically daily.    Marland Kitchen. omega-3 acid ethyl esters (LOVAZA) 1 g capsule Take 1 capsule by mouth 2 (two) times daily.    . pravastatin (PRAVACHOL) 20 MG tablet TAKE 1 TABLET BY MOUTH ONCE DAILY. 90 tablet 0  . Sofosbuvir-Velpatasvir (EPCLUSA) 400-100 MG TABS Take 1 tablet by mouth daily. 28 tablet 5   No current facility-administered medications on file prior to visit.     Social History   Socioeconomic History  . Marital status: Legally Separated    Spouse name: Not on file  . Number of children: Not on file  . Years of education: Not on file  . Highest education level: Not on file  Occupational History  . Not on file  Social Needs  . Financial resource strain: Not on file  . Food insecurity:    Worry: Not on file    Inability: Not on file  . Transportation needs:    Medical: Not on file    Non-medical: Not on file  Tobacco Use  . Smoking status: Current Every Day Smoker    Packs/day: 0.30    Types: Cigarettes  . Smokeless tobacco: Never Used  . Tobacco comment: Trying to slow down  Substance and Sexual Activity  . Alcohol use:  Yes    Alcohol/week: 3.0 oz    Types: 5 Cans of beer per week    Comment: occas  . Drug use: Not Currently    Types: Cocaine    Comment: last use 07/22/16  . Sexual activity: Not Currently    Partners: Female    Comment: given condons  Lifestyle  . Physical activity:    Days per week: Not on file    Minutes per session: Not on file  . Stress: Not on file  Relationships  . Social connections:    Talks on phone: Not on file    Gets together: Not on file    Attends religious service: Not on file    Active member of club or organization: Not on file    Attends meetings of clubs or organizations: Not on file    Relationship status: Not on file  . Intimate partner violence:    Fear of current or ex partner: Not on file    Emotionally abused: Not on file    Physically abused: Not on file     Forced sexual activity: Not on file  Other Topics Concern  . Not on file  Social History Narrative  . Not on file    Family History  Problem Relation Age of Onset  . Diabetes Mother   . Heart failure Mother     BP (!) 148/94   Pulse (!) 101   Temp 98.4 F (36.9 C)   Ht 6\' 3"  (1.905 m)   Wt 175 lb (79.4 kg)   BMI 21.87 kg/m   Body mass index is 21.87 kg/m.     Objective:   Physical Exam  Constitutional: He is oriented to person, place, and time. He appears well-developed and well-nourished.  HENT:  Head: Normocephalic and atraumatic.  Eyes: Pupils are equal, round, and reactive to light. Conjunctivae and EOM are normal.  Neck: Normal range of motion. Neck supple.  Cardiovascular: Normal rate, regular rhythm and intact distal pulses.  Pulmonary/Chest: Effort normal.  Abdominal: Soft.  Musculoskeletal:       Back:  Neurological: He is alert and oriented to person, place, and time. He has normal reflexes. He displays normal reflexes. No cranial nerve deficit. He exhibits normal muscle tone. Coordination normal.  Skin: Skin is warm and dry.  Psychiatric: He has a normal mood and affect. His behavior is normal. Judgment and thought content normal.          Assessment & Plan:   Encounter Diagnosis  Name Primary?  . Chronic right-sided low back pain with right-sided sciatica Yes   I am concerned he may have HNP.  His insurance requires a six week wait before an MRI can be done.  I will give prednisone dose pack today.  I will give pain medicine.  I have reviewed the [place] Controlled Substance Reporting System web site prior to prescribing narcotic medicine for this patient.  I will see him in two weeks.  Get lumbar x-rays on return if he is still in pain.  Consider MRI.  Call if any problem.  Precautions discussed.   Electronically Signed Darreld Mclean, MD 7/24/20197:30 AM

## 2017-09-25 ENCOUNTER — Encounter: Payer: Self-pay | Admitting: Internal Medicine

## 2017-09-25 ENCOUNTER — Ambulatory Visit (INDEPENDENT_AMBULATORY_CARE_PROVIDER_SITE_OTHER): Payer: Medicare HMO | Admitting: Internal Medicine

## 2017-09-25 DIAGNOSIS — B182 Chronic viral hepatitis C: Secondary | ICD-10-CM

## 2017-09-25 DIAGNOSIS — B20 Human immunodeficiency virus [HIV] disease: Secondary | ICD-10-CM

## 2017-09-25 DIAGNOSIS — F17200 Nicotine dependence, unspecified, uncomplicated: Secondary | ICD-10-CM

## 2017-09-25 NOTE — Assessment & Plan Note (Signed)
His infection has been under very good long-term control.  He will get blood work today, continue Radio producerBiktarvy and follow-up in 3 months.

## 2017-09-25 NOTE — Assessment & Plan Note (Signed)
I encouraged him to consider quitting. 

## 2017-09-25 NOTE — Progress Notes (Signed)
Patient Active Problem List   Diagnosis Date Noted  . Human immunodeficiency virus (HIV) disease (HCC) 03/03/2006    Priority: High  . Chronic hepatitis C virus infection (HCC) 03/03/2006    Priority: Medium  . Chest pain 07/23/2016  . Cocaine abuse (HCC) 04/14/2015  . Normocytic anemia 04/14/2015  . Thrombocytopenia (HCC) 04/14/2015  . Type 2 diabetes mellitus (HCC) 03/03/2006  . TOBACCO USER 03/03/2006  . Coronary atherosclerosis 03/03/2006    Patient's Medications  New Prescriptions   No medications on file  Previous Medications   APIXABAN (ELIQUIS) 5 MG TABS TABLET    Take 1 tablet (5 mg total) by mouth 2 (two) times daily.   BICTEGRAVIR-EMTRICITABINE-TENOFOVIR AF (BIKTARVY) 50-200-25 MG TABS TABLET    Take 1 tablet by mouth daily.   CYCLOBENZAPRINE (FLEXERIL) 10 MG TABLET    Take 1 tablet (10 mg total) by mouth 3 (three) times daily.   DEXAMETHASONE (DECADRON) 4 MG TABLET    Take 1 tablet (4 mg total) by mouth 2 (two) times daily with a meal.   DILTIAZEM (CARDIZEM) 30 MG TABLET    Take 1 tablet (30 mg total) by mouth 3 (three) times daily.   GLIPIZIDE (GLUCOTROL XL) 10 MG 24 HR TABLET    Take 1 tablet (10 mg total) by mouth daily with breakfast.   GLUCOSE BLOOD TEST STRIP    Use as instructed   HYDROCODONE-ACETAMINOPHEN (NORCO/VICODIN) 5-325 MG TABLET    One tablet every four hours as needed for acute pain.  Limit of five days per [place] statue.   LIDOCAINE-PRILOCAINE (EMLA) CREAM    Apply 1 application topically daily.   OMEGA-3 ACID ETHYL ESTERS (LOVAZA) 1 G CAPSULE    Take 1 capsule by mouth 2 (two) times daily.   PRAVASTATIN (PRAVACHOL) 20 MG TABLET    TAKE 1 TABLET BY MOUTH ONCE DAILY.   PREDNISONE (STERAPRED UNI-PAK 21 TAB) 5 MG (21) TBPK TABLET    Take 6 pills first day; 5 pills second day; 4 pills third day; 3 pills fourth day; 2 pills next day and 1 pill last day.   TIZANIDINE (ZANAFLEX) 4 MG TABLET    One by mouth every night before bed as needed for  spasm  Modified Medications   No medications on file  Discontinued Medications   SOFOSBUVIR-VELPATASVIR (EPCLUSA) 400-100 MG TABS    Take 1 tablet by mouth daily.    Subjective: Anthony Ferguson is in for his routine HIV follow-up visit.  He has not had any problems obtaining, taking or tolerating his Biktarvy and he does not recall missing any doses.  He completed 6 months of Epclusa in April.  He continues to smoke cigarettes and does not think he is ready to quit.  He only drinks beer occasionally and not to excess and he denies any illicit drug use.  He is not taking some of his other medications.  It sounds like he is taking a lower dose of glipizide than prescribed.  He is taking his pravastatin.  He cannot tell me which medicines he is not taking or he is out of.  He says that he has been unable to schedule a follow-up visit with his primary care provider because no the office will answer the phone.  Review of Systems: Review of Systems  Constitutional: Positive for weight loss. Negative for chills, diaphoresis, fever and malaise/fatigue.  HENT: Negative for sore throat.   Respiratory: Negative for cough, sputum production  and shortness of breath.   Cardiovascular: Negative for chest pain.  Gastrointestinal: Negative for abdominal pain, diarrhea, heartburn, nausea and vomiting.  Genitourinary: Negative for dysuria and frequency.  Musculoskeletal: Positive for back pain. Negative for joint pain and myalgias.  Skin: Negative for rash.  Neurological: Negative for dizziness and headaches.  Psychiatric/Behavioral: Negative for depression and substance abuse. The patient is not nervous/anxious.     Past Medical History:  Diagnosis Date  . HIV (human immunodeficiency virus infection) (HCC)   . Hypercholesteremia   . Hypertension   . Type 2 diabetes mellitus (HCC)     Social History   Tobacco Use  . Smoking status: Current Every Day Smoker    Packs/day: 0.30    Types: Cigarettes  .  Smokeless tobacco: Never Used  . Tobacco comment: Trying to slow down  Substance Use Topics  . Alcohol use: Yes    Alcohol/week: 3.0 oz    Types: 5 Cans of beer per week    Comment: occas  . Drug use: Not Currently    Types: Cocaine    Comment: last use 07/22/16    Family History  Problem Relation Age of Onset  . Diabetes Mother   . Heart failure Mother     Allergies  Allergen Reactions  . Hctz [Hydrochlorothiazide] Other (See Comments)    "ran my sugar up"  . Pork-Derived Products Palpitations and Hypertension    Health Maintenance  Topic Date Due  . FOOT EXAM  09/26/1958  . OPHTHALMOLOGY EXAM  09/26/1958  . URINE MICROALBUMIN  09/26/1958  . TETANUS/TDAP  09/26/1967  . COLONOSCOPY  09/26/1998  . HEMOGLOBIN A1C  10/16/2011  . PNA vac Low Risk Adult (1 of 2 - PCV13) 09/25/2013  . INFLUENZA VACCINE  09/20/2017  . Hepatitis C Screening  Completed    Objective:  Vitals:   09/25/17 0905  BP: (!) 155/94  Pulse: 87  Temp: 97.8 F (36.6 C)  Weight: 166 lb (75.3 kg)  Height: 6\' 3"  (1.905 m)   Body mass index is 20.75 kg/m.  Physical Exam  Constitutional: He is oriented to person, place, and time.  He is in good spirits.  HENT:  Mouth/Throat: No oropharyngeal exudate.  Eyes: Conjunctivae are normal.  Cardiovascular: Normal rate, regular rhythm and normal heart sounds.  No murmur heard. Pulmonary/Chest: Effort normal and breath sounds normal.  Abdominal: Soft. He exhibits no mass. There is no tenderness.  Musculoskeletal: Normal range of motion.  Neurological: He is alert and oriented to person, place, and time.  Skin: No rash noted.  Psychiatric: He has a normal mood and affect.    Lab Results Lab Results  Component Value Date   WBC 3.0 (L) 05/22/2017   HGB 14.0 05/22/2017   HCT 40.6 05/22/2017   MCV 91.9 05/22/2017   PLT 131 (L) 05/22/2017    Lab Results  Component Value Date   CREATININE 1.19 05/22/2017   BUN 19 05/22/2017   NA 133 (L) 05/22/2017    K 4.6 05/22/2017   CL 106 05/22/2017   CO2 27 05/22/2017    Lab Results  Component Value Date   ALT 17 05/22/2017   AST 27 05/22/2017   ALKPHOS 103 10/12/2016   BILITOT 0.8 05/22/2017    Lab Results  Component Value Date   CHOL 184 05/22/2017   HDL 88 05/22/2017   LDLCALC 81 05/22/2017   TRIG 69 05/22/2017   CHOLHDL 2.1 05/22/2017   Lab Results  Component Value Date  LABRPR NON-REACTIVE 05/22/2017   HIV 1 RNA Quant (copies/mL)  Date Value  05/22/2017 <20 NOT DETECTED  05/22/2017 <20 DETECTED (A)  03/14/2017 <20 NOT DETECTED   CD4 T Cell Abs (/uL)  Date Value  05/22/2017 510  10/12/2016 370 (L)  12/20/2015 550     Problem List Items Addressed This Visit      High   Human immunodeficiency virus (HIV) disease (HCC)    His infection has been under very good long-term control.  He will get blood work today, continue Radio producerBiktarvy and follow-up in 3 months.      Relevant Orders   T-helper cell (CD4)- ([facility] clinic only)   HIV 1 RNA quant-no reflex-bld   CBC   Comprehensive metabolic panel   Hepatitis C RNA quantitative     Medium   Chronic hepatitis C virus infection (HCC)    I suspect that his hepatitis C has been cured.  I will get a repeat viral load today.        Unprioritized   TOBACCO USER    I encouraged him to consider quitting.           Cliffton AstersJohn Avanna Sowder, MD [facility] [phone] pager   [phone] cell 09/25/2017, 9:35 AM

## 2017-09-25 NOTE — Assessment & Plan Note (Signed)
I suspect that his hepatitis C has been cured.  I will get a repeat viral load today.

## 2017-09-26 ENCOUNTER — Telehealth: Payer: Self-pay | Admitting: Radiology

## 2017-09-26 ENCOUNTER — Ambulatory Visit (INDEPENDENT_AMBULATORY_CARE_PROVIDER_SITE_OTHER): Payer: Medicare HMO | Admitting: Orthopaedic Surgery

## 2017-09-26 ENCOUNTER — Encounter: Payer: Self-pay | Admitting: Orthopaedic Surgery

## 2017-09-26 VITALS — BP 157/88 | HR 74 | Temp 97.3°F | Ht 75.0 in | Wt 167.0 lb

## 2017-09-26 DIAGNOSIS — M5441 Lumbago with sciatica, right side: Secondary | ICD-10-CM

## 2017-09-26 DIAGNOSIS — M5136 Other intervertebral disc degeneration, lumbar region: Secondary | ICD-10-CM | POA: Diagnosis not present

## 2017-09-26 DIAGNOSIS — G8929 Other chronic pain: Secondary | ICD-10-CM | POA: Diagnosis not present

## 2017-09-26 LAB — T-HELPER CELL (CD4) - (RCID CLINIC ONLY)
CD4 % Helper T Cell: 42 % (ref 33–55)
CD4 T Cell Abs: 460 /uL (ref 400–2700)

## 2017-09-26 MED ORDER — HYDROCODONE-ACETAMINOPHEN 5-325 MG PO TABS: ORAL_TABLET | ORAL | 0 refills | Status: DC

## 2017-09-26 NOTE — Telephone Encounter (Signed)
Voice mail not set up Trying to reach patient about his MRI scan  It is scheduled 10/03/17 at 4 pm, arrive at 3:30 the follow up appt is scheduled for 10/04/17 at 3pm, with Dr Hilda LiasKeeling  Will continue to try to reach him.

## 2017-09-26 NOTE — Progress Notes (Signed)
Patient Anthony Ferguson, male DOB:05-19-1950, 67 y.o. YQM:[id number]  Chief Complaint  Patient presents with  . Back Pain    HPI  Anthony Ferguson is a 67 y.o. male who has worsening pain of the lower back with significant sciatica and marked pain on the right side.  Pain goes to the foot.  He has gotten weaker on the right side and leg gives way.  The prednisone only helped a little.  He is out of his pain medicine.  He has no new trauma.   Body mass index is 20.87 kg/m.  ROS  Review of Systems  Constitutional: Positive for activity change.  Musculoskeletal: Positive for arthralgias, back pain and gait problem.    All other systems reviewed and are negative.  Past Medical History:  Diagnosis Date  . HIV (human immunodeficiency virus infection) (HCC)   . Hypercholesteremia   . Hypertension   . Type 2 diabetes mellitus (HCC)     Past Surgical History:  Procedure Laterality Date  . CARDIAC SURGERY    . CIRCUMCISION    . CORONARY ARTERY BYPASS GRAFT      Family History  Problem Relation Age of Onset  . Diabetes Mother   . Heart failure Mother     Social History Social History   Tobacco Use  . Smoking status: Current Every Day Smoker    Packs/day: 0.30    Types: Cigarettes  . Smokeless tobacco: Never Used  . Tobacco comment: Trying to slow down  Substance Use Topics  . Alcohol use: Yes    Alcohol/week: 3.0 oz    Types: 5 Cans of beer per week    Comment: occas  . Drug use: Not Currently    Types: Cocaine    Comment: last use 07/22/16    Allergies  Allergen Reactions  . Hctz [Hydrochlorothiazide] Other (See Comments)    "ran my sugar up"  . Pork-Derived Products Palpitations and Hypertension    Current Outpatient Medications  Medication Sig Dispense Refill  . apixaban (ELIQUIS) 5 MG TABS tablet Take 1 tablet (5 mg total) by mouth 2 (two) times daily. 60 tablet 0  . bictegravir-emtricitabine-tenofovir AF (BIKTARVY) 50-200-25 MG TABS tablet Take 1  tablet by mouth daily. 30 tablet 11  . cyclobenzaprine (FLEXERIL) 10 MG tablet Take 1 tablet (10 mg total) by mouth 3 (three) times daily. 20 tablet 0  . diltiazem (CARDIZEM) 30 MG tablet Take 1 tablet (30 mg total) by mouth 3 (three) times daily. 90 tablet 0  . glipiZIDE (GLUCOTROL XL) 10 MG 24 hr tablet Take 1 tablet (10 mg total) by mouth daily with breakfast. 30 tablet 3  . glucose blood test strip Use as instructed 100 each 12  . HYDROcodone-acetaminophen (NORCO/VICODIN) 5-325 MG tablet One tablet every four hours as needed for acute pain.  Limit of five days per [place] statue. 30 tablet 0  . lidocaine-prilocaine (EMLA) cream Apply 1 application topically daily.    Marland Kitchen omega-3 acid ethyl esters (LOVAZA) 1 g capsule Take 1 capsule by mouth 2 (two) times daily.    . pravastatin (PRAVACHOL) 20 MG tablet TAKE 1 TABLET BY MOUTH ONCE DAILY. 90 tablet 0  . tiZANidine (ZANAFLEX) 4 MG tablet One by mouth every night before bed as needed for spasm 30 tablet 0   No current facility-administered medications for this visit.      Physical Exam  Blood pressure (!) 157/88, pulse 74, temperature (!) 97.3 F (36.3 C), height 6\' 3"  (1.905 m),  weight 167 lb (75.8 kg).  Constitutional: overall normal hygiene, normal nutrition, well developed, normal grooming, normal body habitus. Assistive device:none  Musculoskeletal: gait and station Limp right, muscle tone and strength are normal, no tremors or atrophy is present.  .  Neurological: coordination overall normal.  Deep tendon reflex/nerve stretch intact.  Sensation normal.  Cranial nerves II-XII intact.   Skin:   Normal overall no scars, lesions, ulcers or rashes. No psoriasis.  Psychiatric: Alert and oriented x 3.  Recent memory intact, remote memory unclear.  Normal mood and affect. Well groomed.  Good eye contact.  Cardiovascular: overall no swelling, no varicosities, no edema bilaterally, normal temperatures of the legs and arms, no clubbing,  cyanosis and good capillary refill.  Lymphatic: palpation is normal.  Right lower back is very tender and tight.  He has positive SLR on the right at 20 degrees.  He is in pain.  He has weakness of the Achilles reflex and has weakness of the right muscles of the lower extremity of 4 of 5.  He limps to the right.  All other systems reviewed and are negative   The patient has been educated about the nature of the problem(s) and counseled on treatment options.  The patient appeared to understand what I have discussed and is in agreement with it.  Encounter Diagnosis  Name Primary?  . Chronic right-sided low back pain with right-sided sciatica Yes    PLAN Call if any problems.  Precautions discussed.  Continue current medications.   Return to clinic after MRI.    I am very concerned he has a HNP of the lower lumbar spine and has weakness now, loss of reflex on the right.  I feels he needs MRI asap.  I will refill his pain medicine.  I have [place] the [place] Cont[place] Sub[place] web site prior to prescribing narcotic medicine for this patient.  Electronically Signed Darreld McleanWayne Rosanne Wohlfarth, MD 8/7/20199:52 AM

## 2017-09-27 LAB — CBC
HCT: 42.9 % (ref 38.5–50.0)
HEMOGLOBIN: 14.8 g/dL (ref 13.2–17.1)
MCH: 31.6 pg (ref 27.0–33.0)
MCHC: 34.5 g/dL (ref 32.0–36.0)
MCV: 91.7 fL (ref 80.0–100.0)
MPV: 10.4 fL (ref 7.5–12.5)
Platelets: 152 10*3/uL (ref 140–400)
RBC: 4.68 10*6/uL (ref 4.20–5.80)
RDW: 12.6 % (ref 11.0–15.0)
WBC: 3.3 10*3/uL — AB (ref 3.8–10.8)

## 2017-09-27 LAB — HIV-1 RNA QUANT-NO REFLEX-BLD
HIV 1 RNA QUANT: DETECTED {copies}/mL — AB
HIV-1 RNA QUANT, LOG: DETECTED {Log_copies}/mL — AB

## 2017-09-27 LAB — COMPREHENSIVE METABOLIC PANEL
AG RATIO: 1.1 (calc) (ref 1.0–2.5)
ALBUMIN MSPROF: 4 g/dL (ref 3.6–5.1)
ALT: 12 U/L (ref 9–46)
AST: 21 U/L (ref 10–35)
Alkaline phosphatase (APISO): 99 U/L (ref 40–115)
BUN: 10 mg/dL (ref 7–25)
CHLORIDE: 101 mmol/L (ref 98–110)
CO2: 28 mmol/L (ref 20–32)
Calcium: 9.6 mg/dL (ref 8.6–10.3)
Creat: 0.87 mg/dL (ref 0.70–1.25)
GLOBULIN: 3.6 g/dL (ref 1.9–3.7)
GLUCOSE: 313 mg/dL — AB (ref 65–99)
Potassium: 5.4 mmol/L — ABNORMAL HIGH (ref 3.5–5.3)
SODIUM: 137 mmol/L (ref 135–146)
Total Bilirubin: 0.6 mg/dL (ref 0.2–1.2)
Total Protein: 7.6 g/dL (ref 6.1–8.1)

## 2017-09-27 LAB — HEPATITIS C RNA QUANTITATIVE
HCV Quantitative Log: <1.18 Log IU/mL
HCV RNA, PCR, QN: NOT DETECTED [IU]/mL

## 2017-10-03 ENCOUNTER — Ambulatory Visit (HOSPITAL_COMMUNITY)
Admission: RE | Admit: 2017-10-03 | Discharge: 2017-10-03 | Disposition: A | Discharge status: Home / Self Care | Payer: Medicare HMO | Source: Ambulatory Visit | Attending: Orthopaedic Surgery | Admitting: Orthopaedic Surgery

## 2017-10-03 DIAGNOSIS — M545 Low back pain: Secondary | ICD-10-CM | POA: Diagnosis not present

## 2017-10-03 DIAGNOSIS — M5441 Lumbago with sciatica, right side: Secondary | ICD-10-CM

## 2017-10-03 DIAGNOSIS — G8929 Other chronic pain: Secondary | ICD-10-CM | POA: Diagnosis not present

## 2017-10-03 DIAGNOSIS — M48061 Spinal stenosis, lumbar region without neurogenic claudication: Secondary | ICD-10-CM | POA: Insufficient documentation

## 2017-10-03 DIAGNOSIS — M5136 Other intervertebral disc degeneration, lumbar region: Secondary | ICD-10-CM

## 2017-10-03 DIAGNOSIS — M47816 Spondylosis without myelopathy or radiculopathy, lumbar region: Secondary | ICD-10-CM | POA: Diagnosis not present

## 2017-10-03 DIAGNOSIS — M5127 Other intervertebral disc displacement, lumbosacral region: Secondary | ICD-10-CM | POA: Diagnosis not present

## 2017-10-03 NOTE — Telephone Encounter (Signed)
Pt answered and will be getting a MRI today for appt with Dr. Hilda LiasKeeling tomorrow.

## 2017-10-04 ENCOUNTER — Encounter: Payer: Self-pay | Admitting: Orthopaedic Surgery

## 2017-10-04 ENCOUNTER — Ambulatory Visit (INDEPENDENT_AMBULATORY_CARE_PROVIDER_SITE_OTHER): Payer: Medicare HMO | Admitting: Orthopaedic Surgery

## 2017-10-04 VITALS — BP 154/84 | HR 78 | Temp 98.1°F | Ht 75.0 in | Wt 166.0 lb

## 2017-10-04 DIAGNOSIS — M5441 Lumbago with sciatica, right side: Secondary | ICD-10-CM | POA: Diagnosis not present

## 2017-10-04 DIAGNOSIS — G8929 Other chronic pain: Secondary | ICD-10-CM | POA: Diagnosis not present

## 2017-10-04 MED ORDER — TIZANIDINE HCL 4 MG PO TABS: ORAL_TABLET | ORAL | 0 refills | Status: DC

## 2017-10-04 NOTE — Progress Notes (Signed)
Patient ZO:XWRUEAV:Anthony Ferguson, male DOB:10/07/1950, 67 y.o. WUJ:[id number]  Chief Complaint  Patient presents with  . Back Pain    Review MRI of L-spine.    HPI  Napoleon FormRaymond J Ferguson is a 67 y.o. male who has continued pain and paresthesias down the right side from his lower back.  He had a MRI which showed: IMPRESSION: 1. No fracture or malalignment. 2. 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 3. Mild canal stenosis L3-4. Similar small L5-S1 disc protrusion resulting in traversing RIGHT S1 nerve impingement. 4. Neural foraminal narrowing L2-3 through L4-5: Moderate on the RIGHT at L4-5.  I have recommended he see a neurosurgeon.  I have explained the findings to him.  I will refill his muscle relaxant.      Body mass index is 20.75 kg/m.  ROS  Review of Systems  All other systems reviewed and are negative.  The following is a summary of the past history medically, past history surgically, known current medicines, social history and family history.  This information is gathered electronically by the computer from prior information and documentation.  I review this each visit and have found including this information at this point in the chart is beneficial and informative.    Past Medical History:  Diagnosis Date  . HIV (human immunodeficiency virus infection) (HCC)   . Hypercholesteremia   . Hypertension   . Type 2 diabetes mellitus (HCC)     Past Surgical History:  Procedure Laterality Date  . CARDIAC SURGERY    . CIRCUMCISION    . CORONARY ARTERY BYPASS GRAFT      Family History  Problem Relation Age of Onset  . Diabetes Mother   . Heart failure Mother     Social History Social History   Tobacco Use  . Smoking status: Current Every Day Smoker    Packs/day: 0.30    Types: Cigarettes  . Smokeless tobacco: Never Used  . Tobacco comment: Trying to slow down  Substance Use Topics  . Alcohol use: Yes   Alcohol/week: 5.0 standard drinks    Types: 5 Cans of beer per week    Comment: occas  . Drug use: Not Currently    Types: Cocaine    Comment: last use 07/22/16    Allergies  Allergen Reactions  . Hctz [Hydrochlorothiazide] Other (See Comments)    "ran my sugar up"  . Pork-Derived Products Palpitations and Hypertension    Current Outpatient Medications  Medication Sig Dispense Refill  . apixaban (ELIQUIS) 5 MG TABS tablet Take 1 tablet (5 mg total) by mouth 2 (two) times daily. 60 tablet 0  . bictegravir-emtricitabine-tenofovir AF (BIKTARVY) 50-200-25 MG TABS tablet Take 1 tablet by mouth daily. 30 tablet 11  . cyclobenzaprine (FLEXERIL) 10 MG tablet Take 1 tablet (10 mg total) by mouth 3 (three) times daily. 20 tablet 0  . diltiazem (CARDIZEM) 30 MG tablet Take 1 tablet (30 mg total) by mouth 3 (three) times daily. 90 tablet 0  . glipiZIDE (GLUCOTROL XL) 10 MG 24 hr tablet Take 1 tablet (10 mg total) by mouth daily with breakfast. 30 tablet 3  . glucose blood test strip Use as instructed 100 each 12  . HYDROcodone-acetaminophen (NORCO/VICODIN) 5-325 MG tablet One tablet every four hours as needed for acute pain.  Limit of five days per [place] statue. 30 tablet 0  . lidocaine-prilocaine (EMLA) cream Apply 1 application topically daily.    Marland Kitchen. omega-3 acid ethyl esters (LOVAZA)  1 g capsule Take 1 capsule by mouth 2 (two) times daily.    . pravastatin (PRAVACHOL) 20 MG tablet TAKE 1 TABLET BY MOUTH ONCE DAILY. 90 tablet 0  . tiZANidine (ZANAFLEX) 4 MG tablet One by mouth every night before bed as needed for spasm 30 tablet 0   No current facility-administered medications for this visit.      Physical Exam  Blood pressure (!) 154/84, pulse 78, temperature 98.1 F (36.7 C), height 6\' 3"  (1.905 m), weight 166 lb (75.3 kg).  Constitutional: overall normal hygiene, normal nutrition, well developed, normal grooming, normal body habitus. Assistive device:none  Musculoskeletal: gait and  station Limp right, muscle tone and strength are normal, no tremors or atrophy is present.  .  Neurological: coordination overall normal.  Deep tendon reflex/nerve stretch intact.  Sensation normal.  Cranial nerves II-XII intact.   Skin:   Normal overall no scars, lesions, ulcers or rashes. No psoriasis.  Psychiatric: Alert and oriented x 3.  Recent memory intact, remote memory unclear.  Normal mood and affect. Well groomed.  Good eye contact.  Cardiovascular: overall no swelling, no varicosities, no edema bilaterally, normal temperatures of the legs and arms, no clubbing, cyanosis and good capillary refill.  Lymphatic: palpation is normal.  All other systems reviewed and are negative   Spine/Pelvis examination:  Inspection:  Overall, sacoiliac joint benign and hips nontender; without crepitus or defects.   Thoracic spine inspection: Alignment normal without kyphosis present   Lumbar spine inspection:  Alignment  with normal lumbar lordosis, without scoliosis apparent.   Thoracic spine palpation:  without tenderness of spinal processes   Lumbar spine palpation: without tenderness of lumbar area; without tightness of lumbar muscles    Range of Motion:   Lumbar flexion, forward flexion is normal without pain or tenderness    Lumbar extension is full without pain or tenderness   Left lateral bend is normal without pain or tenderness   Right lateral bend is normal without pain or tenderness   Straight leg raising is normal  Strength & tone: normal   Stability overall normal stability The patient has been educated about the nature of the problem(s) and counseled on treatment options.  The patient appeared to understand what I have discussed and is in agreement with it.  Encounter Diagnosis  Name Primary?  . Chronic right-sided low back pain with right-sided sciatica Yes    PLAN Call if any problems.  Precautions discussed.  Continue current medications.   Return to clinic to  neurosurgeon   Electronically Signed Darreld McleanWayne Bhavana Kady, MD 8/15/20193:31 PM

## 2017-10-12 ENCOUNTER — Other Ambulatory Visit: Payer: Self-pay | Admitting: Orthopaedic Surgery

## 2017-10-12 NOTE — Telephone Encounter (Signed)
Patient is awaiting appointment with Dr Jeral FruitBotero.  He requests refill on Hydrocodone/Acetaminophen 5-324  Mgs.  Qty  30  Sig: One tablet every four hours as needed for acute pain. Limit of five days per  statue.  Patient states he uses [facility]

## 2017-10-15 MED ORDER — HYDROCODONE-ACETAMINOPHEN 5-325 MG PO TABS: ORAL_TABLET | ORAL | 0 refills | Status: DC

## 2017-11-08 ENCOUNTER — Telehealth: Payer: Self-pay | Admitting: Orthopaedic Surgery

## 2017-11-08 MED ORDER — TIZANIDINE HCL 4 MG PO TABS: ORAL_TABLET | ORAL | 0 refills | Status: DC

## 2017-11-08 NOTE — Telephone Encounter (Signed)
Patient requests refill of medication:  tiZANidine (ZANAFLEX) 4 MG tablet  30 tablet  -[facility]

## 2017-11-13 ENCOUNTER — Telehealth: Payer: Self-pay | Admitting: Orthopaedic Surgery

## 2017-11-13 MED ORDER — HYDROCODONE-ACETAMINOPHEN 5-325 MG PO TABS: ORAL_TABLET | ORAL | 0 refills | Status: DC

## 2017-11-13 NOTE — Telephone Encounter (Signed)
Patient called to request refill:  HYDROcodone-acetaminophen (NORCO/VICODIN) 5-325 MG tablet 25 tablet   - Pharmacy is [facility]   - patient aware of referral appointment [facility] at [facility] for 11/28/17.

## 2017-11-20 ENCOUNTER — Other Ambulatory Visit: Payer: Self-pay | Admitting: Internal Medicine

## 2017-11-20 DIAGNOSIS — B182 Chronic viral hepatitis C: Secondary | ICD-10-CM

## 2017-12-19 ENCOUNTER — Telehealth: Payer: Self-pay | Admitting: Orthopaedic Surgery

## 2017-12-19 MED ORDER — HYDROCODONE-ACETAMINOPHEN 5-325 MG PO TABS: ORAL_TABLET | ORAL | 0 refills | Status: DC

## 2017-12-19 NOTE — Telephone Encounter (Signed)
Patient called to request refill:  HYDROcodone-acetaminophen (NORCO/VICODIN) 5-325 MG tablet 25 tablet   - [facility]    - I reviewed with patient, referral made to [facility] at time of visit 10/04/17. States has transportation problems and has not gone there for the appointment. Please advise.

## 2017-12-27 ENCOUNTER — Ambulatory Visit: Payer: Medicare HMO | Admitting: Internal Medicine

## 2018-01-01 ENCOUNTER — Ambulatory Visit: Payer: Medicare HMO | Admitting: Internal Medicine

## 2018-01-08 ENCOUNTER — Other Ambulatory Visit: Payer: Self-pay | Admitting: Internal Medicine

## 2018-01-08 DIAGNOSIS — I25110 Atherosclerotic heart disease of native coronary artery with unstable angina pectoris: Secondary | ICD-10-CM

## 2018-01-24 ENCOUNTER — Ambulatory Visit: Payer: Medicare HMO | Admitting: Internal Medicine

## 2018-03-19 ENCOUNTER — Encounter: Payer: Self-pay | Admitting: Internal Medicine

## 2018-03-19 ENCOUNTER — Ambulatory Visit (INDEPENDENT_AMBULATORY_CARE_PROVIDER_SITE_OTHER): Payer: Medicare HMO | Admitting: Internal Medicine

## 2018-03-19 VITALS — BP 138/81 | HR 80 | Temp 97.9°F | Wt 174.0 lb

## 2018-03-19 DIAGNOSIS — B20 Human immunodeficiency virus [HIV] disease: Secondary | ICD-10-CM

## 2018-03-19 DIAGNOSIS — Z720 Tobacco use: Secondary | ICD-10-CM

## 2018-03-19 DIAGNOSIS — Z23 Encounter for immunization: Secondary | ICD-10-CM

## 2018-03-19 DIAGNOSIS — Z91018 Allergy to other foods: Secondary | ICD-10-CM | POA: Diagnosis not present

## 2018-03-19 DIAGNOSIS — Z8619 Personal history of other infectious and parasitic diseases: Secondary | ICD-10-CM

## 2018-03-19 DIAGNOSIS — Z888 Allergy status to other drugs, medicaments and biological substances status: Secondary | ICD-10-CM | POA: Diagnosis not present

## 2018-03-19 DIAGNOSIS — I252 Old myocardial infarction: Secondary | ICD-10-CM

## 2018-03-19 DIAGNOSIS — B182 Chronic viral hepatitis C: Secondary | ICD-10-CM

## 2018-03-19 DIAGNOSIS — Z79899 Other long term (current) drug therapy: Secondary | ICD-10-CM

## 2018-03-19 DIAGNOSIS — F17200 Nicotine dependence, unspecified, uncomplicated: Secondary | ICD-10-CM

## 2018-03-19 NOTE — Addendum Note (Signed)
Addended by: Gerarda Fraction on: 03/19/2018 12:11 PM   Modules accepted: Orders

## 2018-03-19 NOTE — Progress Notes (Signed)
Patient Active Problem List   Diagnosis Date Noted  . Human immunodeficiency virus (HIV) disease (HCC) 03/03/2006    Priority: High  . Chronic hepatitis C virus infection (HCC) 03/03/2006    Priority: Medium  . Chest pain 07/23/2016  . Cocaine abuse (HCC) 04/14/2015  . Normocytic anemia 04/14/2015  . Thrombocytopenia (HCC) 04/14/2015  . Type 2 diabetes mellitus (HCC) 03/03/2006  . TOBACCO USER 03/03/2006  . Coronary atherosclerosis 03/03/2006    Patient's Medications  New Prescriptions   No medications on file  Previous Medications   APIXABAN (ELIQUIS) 5 MG TABS TABLET    Take 1 tablet (5 mg total) by mouth 2 (two) times daily.   BIKTARVY 50-200-25 MG TABS TABLET    TAKE 1 TABLET EVERY DAY.   CYCLOBENZAPRINE (FLEXERIL) 10 MG TABLET    Take 1 tablet (10 mg total) by mouth 3 (three) times daily.   DILTIAZEM (CARDIZEM) 30 MG TABLET    Take 1 tablet (30 mg total) by mouth 3 (three) times daily.   GLIPIZIDE (GLUCOTROL XL) 10 MG 24 HR TABLET    Take 1 tablet (10 mg total) by mouth daily with breakfast.   GLUCOSE BLOOD TEST STRIP    Use as instructed   HYDROCODONE-ACETAMINOPHEN (NORCO/VICODIN) 5-325 MG TABLET    One tablet every four hours as needed for acute pain.  Limit of five days per [place] statue.   LIDOCAINE-PRILOCAINE (EMLA) CREAM    Apply 1 application topically daily.   OMEGA-3 ACID ETHYL ESTERS (LOVAZA) 1 G CAPSULE    Take 1 capsule by mouth 2 (two) times daily.   PRAVASTATIN (PRAVACHOL) 20 MG TABLET    TAKE 1 TABLET BY MOUTH ONCE DAILY.   TIZANIDINE (ZANAFLEX) 4 MG TABLET    One by mouth every night before bed as needed for spasm  Modified Medications   No medications on file  Discontinued Medications   No medications on file    Subjective: Anthony Ferguson is in for his routine HIV follow-up visit.  He has not had any problems obtaining, taking or tolerating his Biktarvy and does not recall missing any doses.  He developed a head cold about 1 week ago.  He has had  sinus congestion and productive cough.  He denies any fever, chills, sweats or shortness of breath.  He has been using nighttime NyQuil for symptomatic relief.  He is still smoking 6 to 7 cigarettes daily.  He did quit for about 8 months after his heart attack many years ago.  Review of Systems: Review of Systems  Constitutional: Negative for chills, diaphoresis, fever, malaise/fatigue and weight loss.  HENT: Positive for congestion. Negative for sore throat.   Respiratory: Positive for cough and sputum production. Negative for shortness of breath.   Cardiovascular: Negative for chest pain.  Gastrointestinal: Negative for abdominal pain, diarrhea, heartburn, nausea and vomiting.  Genitourinary: Negative for dysuria and frequency.  Musculoskeletal: Negative for joint pain and myalgias.  Skin: Negative for rash.  Neurological: Negative for dizziness and headaches.  Psychiatric/Behavioral: Negative for depression and substance abuse. The patient is not nervous/anxious.     Past Medical History:  Diagnosis Date  . HIV (human immunodeficiency virus infection) (HCC)   . Hypercholesteremia   . Hypertension   . Type 2 diabetes mellitus (HCC)     Social History   Tobacco Use  . Smoking status: Current Every Day Smoker    Packs/day: 0.30    Types: Cigarettes  .  Smokeless tobacco: Never Used  . Tobacco comment: Trying to slow down  Substance Use Topics  . Alcohol use: Yes    Alcohol/week: 5.0 standard drinks    Types: 5 Cans of beer per week    Comment: occas  . Drug use: Not Currently    Types: Cocaine    Comment: last use 07/22/16    Family History  Problem Relation Age of Onset  . Diabetes Mother   . Heart failure Mother     Allergies  Allergen Reactions  . Hctz [Hydrochlorothiazide] Other (See Comments)    "ran my sugar up"  . Pork-Derived Products Palpitations and Hypertension    Health Maintenance  Topic Date Due  . FOOT EXAM  09/26/1958  . OPHTHALMOLOGY EXAM   09/26/1958  . URINE MICROALBUMIN  09/26/1958  . TETANUS/TDAP  09/26/1967  . COLONOSCOPY  09/26/1998  . HEMOGLOBIN A1C  10/16/2011  . PNA vac Low Risk Adult (1 of 2 - PCV13) 09/25/2013  . INFLUENZA VACCINE  09/20/2017  . Hepatitis C Screening  Completed    Objective:  Vitals:   03/19/18 0933  BP: 138/81  Pulse: 80  Temp: 97.9 F (36.6 C)  Weight: 174 lb (78.9 kg)   Body mass index is 21.75 kg/m.  Physical Exam Constitutional:      Comments: He is in good spirits.  HENT:     Mouth/Throat:     Pharynx: No oropharyngeal exudate.  Eyes:     Conjunctiva/sclera: Conjunctivae normal.  Cardiovascular:     Rate and Rhythm: Normal rate and regular rhythm.     Heart sounds: No murmur.  Pulmonary:     Effort: Pulmonary effort is normal.     Breath sounds: Normal breath sounds.  Abdominal:     Palpations: Abdomen is soft. There is no mass.     Tenderness: There is no abdominal tenderness.  Musculoskeletal: Normal range of motion.  Skin:    Findings: No rash.  Neurological:     Mental Status: He is alert and oriented to person, place, and time.  Psychiatric:        Mood and Affect: Mood normal.     Lab Results Lab Results  Component Value Date   WBC 3.3 (L) 09/25/2017   HGB 14.8 09/25/2017   HCT 42.9 09/25/2017   MCV 91.7 09/25/2017   PLT 152 09/25/2017    Lab Results  Component Value Date   CREATININE 0.87 09/25/2017   BUN 10 09/25/2017   NA 137 09/25/2017   K 5.4 (H) 09/25/2017   CL 101 09/25/2017   CO2 28 09/25/2017    Lab Results  Component Value Date   ALT 12 09/25/2017   AST 21 09/25/2017   ALKPHOS 103 10/12/2016   BILITOT 0.6 09/25/2017    Lab Results  Component Value Date   CHOL 184 05/22/2017   HDL 88 05/22/2017   LDLCALC 81 05/22/2017   TRIG 69 05/22/2017   CHOLHDL 2.1 05/22/2017   Lab Results  Component Value Date   LABRPR NON-REACTIVE 05/22/2017   HIV 1 RNA Quant (copies/mL)  Date Value  09/25/2017 <20 DETECTED (A)  05/22/2017 <20  NOT DETECTED  05/22/2017 <20 DETECTED (A)   CD4 T Cell Abs (/uL)  Date Value  09/25/2017 460  05/22/2017 510  10/12/2016 370 (L)     Problem List Items Addressed This Visit      High   Human immunodeficiency virus (HIV) disease (HCC)    His infection remains under very  good long-term control.  He will get repeat lab work today, continue [place] and follow-up in 6 months.      Relevant Orders   T-helper cell (CD4)- ([facility] clinic only)   HIV-1 RNA quant-no reflex-bld   CBC   Comprehensive metabolic panel   RPR   Lipid panel     Medium   Chronic hepatitis C virus infection (HCC)    His chronic hepatitis C was cured.  His viral load 3 months after completing therapy remained undetectable.        Unprioritized   TOBACCO USER    I encouraged him to set a quit date and do his best to quit smoking again.           Cliffton Asters, MD [facility] [phone] pager   [phone] cell 03/19/2018, 10:04 AM

## 2018-03-19 NOTE — Assessment & Plan Note (Signed)
His infection remains under very good long-term control.  He will get repeat lab work today, continue [place] and follow-up in 6 months.

## 2018-03-19 NOTE — Assessment & Plan Note (Signed)
I encouraged him to set a quit date and do his best to quit smoking again.

## 2018-03-19 NOTE — Assessment & Plan Note (Signed)
His chronic hepatitis C was cured.  His viral load 3 months after completing therapy remained undetectable.

## 2018-03-20 LAB — T-HELPER CELL (CD4) - (RCID CLINIC ONLY)
CD4 % Helper T Cell: 38 % (ref 33–55)
CD4 T Cell Abs: 480 /uL (ref 400–2700)

## 2018-03-21 LAB — COMPREHENSIVE METABOLIC PANEL
AG RATIO: 1.2 (calc) (ref 1.0–2.5)
ALBUMIN MSPROF: 4.2 g/dL (ref 3.6–5.1)
ALT: 11 U/L (ref 9–46)
AST: 17 U/L (ref 10–35)
Alkaline phosphatase (APISO): 80 U/L (ref 40–115)
BILIRUBIN TOTAL: 0.7 mg/dL (ref 0.2–1.2)
BUN: 19 mg/dL (ref 7–25)
CHLORIDE: 100 mmol/L (ref 98–110)
CO2: 31 mmol/L (ref 20–32)
Calcium: 10.1 mg/dL (ref 8.6–10.3)
Creat: 1.11 mg/dL (ref 0.70–1.25)
GLOBULIN: 3.5 g/dL (ref 1.9–3.7)
Glucose, Bld: 210 mg/dL — ABNORMAL HIGH (ref 65–99)
POTASSIUM: 4.4 mmol/L (ref 3.5–5.3)
SODIUM: 139 mmol/L (ref 135–146)
TOTAL PROTEIN: 7.7 g/dL (ref 6.1–8.1)

## 2018-03-21 LAB — LIPID PANEL
Cholesterol: 189 mg/dL (ref <200)
HDL: 89 mg/dL (ref >40)
LDL Cholesterol (Calc): 83 mg/dL (calc)
Non-HDL Cholesterol (Calc): 100 mg/dL (calc) (ref <130)
Total CHOL/HDL Ratio: 2.1 (calc) (ref <5.0)
Triglycerides: 79 mg/dL (ref <150)

## 2018-03-21 LAB — CBC
HCT: 44.2 % (ref 38.5–50.0)
HEMOGLOBIN: 15.4 g/dL (ref 13.2–17.1)
MCH: 32.5 pg (ref 27.0–33.0)
MCHC: 34.8 g/dL (ref 32.0–36.0)
MCV: 93.2 fL (ref 80.0–100.0)
MPV: 10.5 fL (ref 7.5–12.5)
PLATELETS: 165 10*3/uL (ref 140–400)
RBC: 4.74 10*6/uL (ref 4.20–5.80)
RDW: 12.7 % (ref 11.0–15.0)
WBC: 3.2 10*3/uL — ABNORMAL LOW (ref 3.8–10.8)

## 2018-03-21 LAB — HIV-1 RNA QUANT-NO REFLEX-BLD
HIV 1 RNA Quant: <20 copies/mL
HIV-1 RNA Quant, Log: <1.3 Log copies/mL

## 2018-03-21 LAB — RPR: RPR Ser Ql: NONREACTIVE

## 2018-05-07 ENCOUNTER — Other Ambulatory Visit: Payer: Self-pay | Admitting: *Deleted

## 2018-05-07 DIAGNOSIS — B182 Chronic viral hepatitis C: Secondary | ICD-10-CM

## 2018-05-07 MED ORDER — BICTEGRAVIR-EMTRICITAB-TENOFOV 50-200-25 MG PO TABS: 1.0000 | ORAL_TABLET | Freq: Every day | ORAL | 5 refills | Status: DC

## 2018-05-08 ENCOUNTER — Other Ambulatory Visit: Payer: Self-pay | Admitting: *Deleted

## 2018-05-08 DIAGNOSIS — I25110 Atherosclerotic heart disease of native coronary artery with unstable angina pectoris: Secondary | ICD-10-CM

## 2018-05-08 MED ORDER — PRAVASTATIN SODIUM 20 MG PO TABS: 20.0000 mg | ORAL_TABLET | Freq: Every day | ORAL | 1 refills | Status: DC

## 2018-05-29 ENCOUNTER — Other Ambulatory Visit: Payer: Self-pay | Admitting: Internal Medicine

## 2018-09-17 ENCOUNTER — Ambulatory Visit: Payer: Medicare HMO | Admitting: Internal Medicine

## 2018-10-03 ENCOUNTER — Ambulatory Visit: Payer: Medicare HMO | Admitting: Internal Medicine

## 2018-11-04 ENCOUNTER — Other Ambulatory Visit: Payer: Self-pay | Admitting: Internal Medicine

## 2018-11-04 DIAGNOSIS — I25110 Atherosclerotic heart disease of native coronary artery with unstable angina pectoris: Secondary | ICD-10-CM

## 2018-11-07 ENCOUNTER — Other Ambulatory Visit: Payer: Self-pay | Admitting: *Deleted

## 2018-11-07 DIAGNOSIS — I25110 Atherosclerotic heart disease of native coronary artery with unstable angina pectoris: Secondary | ICD-10-CM

## 2018-11-07 MED ORDER — PRAVASTATIN SODIUM 20 MG PO TABS: 20.0000 mg | ORAL_TABLET | Freq: Every day | ORAL | 1 refills | Status: DC

## 2018-11-26 ENCOUNTER — Ambulatory Visit (INDEPENDENT_AMBULATORY_CARE_PROVIDER_SITE_OTHER): Payer: Medicare HMO | Admitting: Internal Medicine

## 2018-11-26 ENCOUNTER — Other Ambulatory Visit: Payer: Self-pay

## 2018-11-26 ENCOUNTER — Encounter: Payer: Self-pay | Admitting: Internal Medicine

## 2018-11-26 DIAGNOSIS — N529 Male erectile dysfunction, unspecified: Secondary | ICD-10-CM | POA: Insufficient documentation

## 2018-11-26 DIAGNOSIS — B20 Human immunodeficiency virus [HIV] disease: Secondary | ICD-10-CM | POA: Diagnosis not present

## 2018-11-26 DIAGNOSIS — Z23 Encounter for immunization: Secondary | ICD-10-CM | POA: Diagnosis not present

## 2018-11-26 DIAGNOSIS — B182 Chronic viral hepatitis C: Secondary | ICD-10-CM | POA: Diagnosis not present

## 2018-11-26 MED ORDER — BIKTARVY 50-200-25 MG PO TABS: 1.0000 | ORAL_TABLET | Freq: Every day | ORAL | 11 refills | Status: DC

## 2018-11-26 MED ORDER — SILDENAFIL CITRATE 25 MG PO TABS: 25.0000 mg | ORAL_TABLET | Freq: Every day | ORAL | 11 refills | Status: DC | PRN

## 2018-11-26 NOTE — Assessment & Plan Note (Signed)
His infection is under very good long-term control.  He will get blood work today, continue Airline pilot and follow-up in 1 year.  He received his influenza vaccine today.

## 2018-11-26 NOTE — Progress Notes (Signed)
Patient Active Problem List   Diagnosis Date Noted  . Human immunodeficiency virus (HIV) disease (HCC) 03/03/2006    Priority: High  . Chronic hepatitis C virus infection (HCC) 03/03/2006    Priority: Medium  . Erectile dysfunction 11/26/2018  . Cocaine abuse (HCC) 04/14/2015  . Normocytic anemia 04/14/2015  . Thrombocytopenia (HCC) 04/14/2015  . Type 2 diabetes mellitus (HCC) 03/03/2006  . TOBACCO USER 03/03/2006  . Coronary atherosclerosis 03/03/2006    Patient's Medications  New Prescriptions   SILDENAFIL (VIAGRA) 25 MG TABLET    Take 1 tablet (25 mg total) by mouth daily as needed for erectile dysfunction.  Previous Medications   APIXABAN (ELIQUIS) 5 MG TABS TABLET    Take 1 tablet (5 mg total) by mouth 2 (two) times daily.   CYCLOBENZAPRINE (FLEXERIL) 10 MG TABLET    Take 1 tablet (10 mg total) by mouth 3 (three) times daily.   DILTIAZEM (CARDIZEM) 30 MG TABLET    Take 1 tablet (30 mg total) by mouth 3 (three) times daily.   GLIPIZIDE (GLUCOTROL XL) 10 MG 24 HR TABLET    Take 1 tablet (10 mg total) by mouth daily with breakfast.   GLUCOSE BLOOD TEST STRIP    Use as instructed   HYDROCODONE-ACETAMINOPHEN (NORCO/VICODIN) 5-325 MG TABLET    One tablet every four hours as needed for acute pain.  Limit of five days per [place] statue.   LIDOCAINE-PRILOCAINE (EMLA) CREAM    Apply 1 application topically daily.   OMEGA-3 ACID ETHYL ESTERS (LOVAZA) 1 G CAPSULE    Take 1 capsule by mouth 2 (two) times daily.   PRAVASTATIN (PRAVACHOL) 20 MG TABLET    Take 1 tablet (20 mg total) by mouth daily.   TIZANIDINE (ZANAFLEX) 4 MG TABLET    One by mouth every night before bed as needed for spasm  Modified Medications   Modified Medication Previous Medication   BICTEGRAVIR-EMTRICITABINE-TENOFOVIR AF (BIKTARVY) 50-200-25 MG TABS TABLET bictegravir-emtricitabine-tenofovir AF (BIKTARVY) 50-200-25 MG TABS tablet      Take 1 tablet by mouth daily.    Take 1 tablet by mouth daily.   Discontinued Medications   No medications on file    Subjective: Anthony Ferguson is in for his routine HIV follow-up visit.  He has had no problems obtaining, taking or tolerating his Biktarvy and denies missing any doses.  He takes it each morning after breakfast.  He is still smoking a pack and a half of cigarettes weekly.  He has no current plans to quit.  Review of Systems: Review of Systems  Constitutional: Negative for fever.  Respiratory: Negative for cough and shortness of breath.   Cardiovascular: Negative for chest pain.  Gastrointestinal: Negative for abdominal pain, diarrhea, nausea and vomiting.  Psychiatric/Behavioral: Negative for depression.    Past Medical History:  Diagnosis Date  . HIV (human immunodeficiency virus infection) (HCC)   . Hypercholesteremia   . Hypertension   . Type 2 diabetes mellitus (HCC)     Social History   Tobacco Use  . Smoking status: Current Every Day Smoker    Packs/day: 0.30    Types: Cigarettes  . Smokeless tobacco: Never Used  . Tobacco comment: Trying to slow down  Substance Use Topics  . Alcohol use: Yes    Alcohol/week: 5.0 standard drinks    Types: 5 Cans of beer per week    Comment: occas  . Drug use: Not Currently    Types: Cocaine  Comment: last use 07/22/16    Family History  Problem Relation Age of Onset  . Diabetes Mother   . Heart failure Mother     Allergies  Allergen Reactions  . Hctz [Hydrochlorothiazide] Other (See Comments)    "ran my sugar up"  . Pork-Derived Products Palpitations and Hypertension    Health Maintenance  Topic Date Due  . FOOT EXAM  09/26/1958  . OPHTHALMOLOGY EXAM  09/26/1958  . URINE MICROALBUMIN  09/26/1958  . TETANUS/TDAP  09/26/1967  . COLONOSCOPY  09/26/1998  . HEMOGLOBIN A1C  10/16/2011  . INFLUENZA VACCINE  09/21/2018  . PNA vac Low Risk Adult (2 of 2 - PPSV23) 03/20/2019  . Hepatitis C Screening  Completed    Objective:  Vitals:   11/26/18 0918  BP: (!) 145/73   Pulse: 68  Temp: 97.8 F (36.6 C)   There is no height or weight on file to calculate BMI.  Physical Exam Constitutional:      Comments: He is in good spirits.  Cardiovascular:     Rate and Rhythm: Normal rate and regular rhythm.     Heart sounds: No murmur.  Pulmonary:     Effort: Pulmonary effort is normal.     Breath sounds: Normal breath sounds.  Psychiatric:        Mood and Affect: Mood normal.     Lab Results Lab Results  Component Value Date   WBC 3.2 (L) 03/19/2018   HGB 15.4 03/19/2018   HCT 44.2 03/19/2018   MCV 93.2 03/19/2018   PLT 165 03/19/2018    Lab Results  Component Value Date   CREATININE 1.11 03/19/2018   BUN 19 03/19/2018   NA 139 03/19/2018   K 4.4 03/19/2018   CL 100 03/19/2018   CO2 31 03/19/2018    Lab Results  Component Value Date   ALT 11 03/19/2018   AST 17 03/19/2018   ALKPHOS 103 10/12/2016   BILITOT 0.7 03/19/2018    Lab Results  Component Value Date   CHOL 189 03/19/2018   HDL 89 03/19/2018   LDLCALC 83 03/19/2018   TRIG 79 03/19/2018   CHOLHDL 2.1 03/19/2018   Lab Results  Component Value Date   LABRPR NON-REACTIVE 03/19/2018   HIV 1 RNA Quant (copies/mL)  Date Value  03/19/2018 <20 NOT DETECTED  09/25/2017 <20 DETECTED (A)  05/22/2017 <20 NOT DETECTED   CD4 T Cell Abs (/uL)  Date Value  03/19/2018 480  09/25/2017 460  05/22/2017 510     Problem List Items Addressed This Visit      High   Human immunodeficiency virus (HIV) disease (HCC)    His infection is under very good long-term control.  He will get blood work today, continue Radio producerBiktarvy and follow-up in 1 year.  He received his influenza vaccine today.      Relevant Medications   bictegravir-emtricitabine-tenofovir AF (BIKTARVY) 50-200-25 MG TABS tablet   Other Relevant Orders   CBC   T-helper cell (CD4)- ([facility] clinic only)   Comprehensive metabolic panel   RPR   HIV-1 RNA quant-no reflex-bld   T-helper cell (CD4)- ([facility] clinic only)   HIV-1 RNA  quant-no reflex-bld     Medium   Chronic hepatitis C virus infection (HCC)   Relevant Medications   bictegravir-emtricitabine-tenofovir AF (BIKTARVY) 50-200-25 MG TABS tablet     Unprioritized   Erectile dysfunction   Relevant Medications   sildenafil (VIAGRA) 25 MG tablet        Cliffton AstersJohn Langley Ingalls,  [place] [facility] [phone] pager   [phone] cell 11/26/2018, 9:37 AM

## 2018-11-27 LAB — T-HELPER CELL (CD4) - (RCID CLINIC ONLY)
CD4 % Helper T Cell: 43 % (ref 33–65)
CD4 T Cell Abs: 375 /uL — ABNORMAL LOW (ref 400–1790)

## 2018-11-29 LAB — HIV-1 RNA QUANT-NO REFLEX-BLD
HIV 1 RNA Quant: <20 copies/mL — AB
HIV-1 RNA Quant, Log: <1.3 Log copies/mL — AB

## 2018-12-23 ENCOUNTER — Telehealth: Payer: Self-pay

## 2018-12-23 NOTE — Telephone Encounter (Signed)
Received call from patient requesting refill for glipizide. Patient states he has been out for "awhile" and has been monitoring his carbs/surgar intake.  This medication has not been filled since 02/2017. Advised patient to seek PCP but will also route to Dr. Megan Salon to see if he will approve medication refill. Patient would like medication sent to [facility]. Eugenia Mcalpine

## 2018-12-23 NOTE — Telephone Encounter (Signed)
I agree, glipizide needs to be refilled by his PCP.

## 2018-12-24 NOTE — Telephone Encounter (Signed)
Patient made aware. Tammra Pressman  

## 2018-12-26 ENCOUNTER — Other Ambulatory Visit: Payer: Self-pay

## 2018-12-26 DIAGNOSIS — N529 Male erectile dysfunction, unspecified: Secondary | ICD-10-CM

## 2018-12-26 MED ORDER — SILDENAFIL CITRATE 25 MG PO TABS: 25.0000 mg | ORAL_TABLET | Freq: Every day | ORAL | 11 refills | Status: DC | PRN

## 2019-03-07 ENCOUNTER — Telehealth: Payer: Self-pay | Admitting: Pharmacy Technician

## 2019-03-07 ENCOUNTER — Telehealth: Payer: Self-pay | Admitting: *Deleted

## 2019-03-07 ENCOUNTER — Other Ambulatory Visit: Payer: Self-pay | Admitting: *Deleted

## 2019-03-07 DIAGNOSIS — I25110 Atherosclerotic heart disease of native coronary artery with unstable angina pectoris: Secondary | ICD-10-CM

## 2019-03-07 MED ORDER — PRAVASTATIN SODIUM 20 MG PO TABS: 20.0000 mg | ORAL_TABLET | Freq: Every day | ORAL | 1 refills | Status: DC

## 2019-03-07 NOTE — Telephone Encounter (Signed)
Patient called asking for assistance with copays. RN transferred to [facility] for assistance. Andree Coss, RN

## 2019-03-07 NOTE — Telephone Encounter (Addendum)
[facility] Patient Advocate Encounter   [name] called asking for help with copay assistance.  I let him know I would work towards that for him and would call back if there were any delays.  I confirmed his phone number.   An application for copay assistance was submitted to [employer].  It is being processed and may be approved by Monday or Tuesday of next week.   I called back three times to update him on the status.  Each time there was no answer and the voicemail was full.  I will continue to follow.   [name]. [name] CPhT Specialty Pharmacy Patient [facility] for Infectious Disease Phone: [phone] Fax:  [phone]

## 2019-03-11 NOTE — Telephone Encounter (Signed)
UPDATE:  I followed up with [employer] today and this representative, Mervyn Gay, states that the old application from 2018 will need to be deleted before they can approved a new application.  It will be 24 to 48 hours for that to be done.  I will continue to follow.

## 2019-03-13 NOTE — Telephone Encounter (Signed)
[facility] Patient Advocate Encounter   I was successful in securing patient a $7500 grant from [employer] ([employer]) to provide copayment coverage for Biktarvy.  This will make the out of pocket cost $0.     I have spoken with the patient.    The billing information is as follows and has been shared with [name].   Patient: Anthony Ferguson Award Period: - 03/12/2020 Cardholder: [id number] BIN: [id number] PCN: [id number] Group: [id number] For pharmacy inquiries, contact PDMI at [phone]. For patient inquiries, contact PAF at [phone].  Patient knows to call the office with questions or concerns.  [name]. [name] CPhT Specialty Pharmacy Patient [facility] for Infectious Disease Phone: [phone] Fax:  [phone]

## 2019-09-15 ENCOUNTER — Other Ambulatory Visit: Payer: Self-pay

## 2019-09-15 ENCOUNTER — Emergency Department (HOSPITAL_COMMUNITY)
Admission: EM | Admit: 2019-09-15 | Discharge: 2019-09-15 | Disposition: A | Discharge status: Home / Self Care | Payer: Medicare HMO | Attending: Emergency Medicine | Admitting: Emergency Medicine

## 2019-09-15 ENCOUNTER — Encounter (HOSPITAL_COMMUNITY): Payer: Self-pay | Admitting: *Deleted

## 2019-09-15 DIAGNOSIS — S60455A Superficial foreign body of left ring finger, initial encounter: Secondary | ICD-10-CM | POA: Diagnosis not present

## 2019-09-15 DIAGNOSIS — E119 Type 2 diabetes mellitus without complications: Secondary | ICD-10-CM | POA: Insufficient documentation

## 2019-09-15 DIAGNOSIS — Z951 Presence of aortocoronary bypass graft: Secondary | ICD-10-CM | POA: Diagnosis not present

## 2019-09-15 DIAGNOSIS — Z23 Encounter for immunization: Secondary | ICD-10-CM | POA: Insufficient documentation

## 2019-09-15 DIAGNOSIS — Z79899 Other long term (current) drug therapy: Secondary | ICD-10-CM | POA: Insufficient documentation

## 2019-09-15 DIAGNOSIS — Y999 Unspecified external cause status: Secondary | ICD-10-CM | POA: Insufficient documentation

## 2019-09-15 DIAGNOSIS — I10 Essential (primary) hypertension: Secondary | ICD-10-CM | POA: Insufficient documentation

## 2019-09-15 DIAGNOSIS — S6992XA Unspecified injury of left wrist, hand and finger(s), initial encounter: Secondary | ICD-10-CM

## 2019-09-15 DIAGNOSIS — W458XXA Other foreign body or object entering through skin, initial encounter: Secondary | ICD-10-CM | POA: Insufficient documentation

## 2019-09-15 DIAGNOSIS — Y939 Activity, unspecified: Secondary | ICD-10-CM | POA: Insufficient documentation

## 2019-09-15 DIAGNOSIS — Y929 Unspecified place or not applicable: Secondary | ICD-10-CM | POA: Insufficient documentation

## 2019-09-15 DIAGNOSIS — F17210 Nicotine dependence, cigarettes, uncomplicated: Secondary | ICD-10-CM | POA: Insufficient documentation

## 2019-09-15 MED ORDER — TETANUS-DIPHTH-ACELL PERTUSSIS 5-2.5-18.5 LF-MCG/0.5 IM SUSP
0.5000 mL | Freq: Once | INTRAMUSCULAR | Status: AC
  Administered 2019-09-15: 0.5 mL via INTRAMUSCULAR
  Filled 2019-09-15: qty 0.5

## 2019-09-15 MED ORDER — ACETAMINOPHEN 325 MG PO TABS
650.0000 mg | ORAL_TABLET | Freq: Once | ORAL | Status: AC
  Administered 2019-09-15: 650 mg via ORAL
  Filled 2019-09-15: qty 2

## 2019-09-15 MED ORDER — CEPHALEXIN 500 MG PO CAPS
500.0000 mg | ORAL_CAPSULE | Freq: Four times a day (QID) | ORAL | 0 refills | Status: DC
Start: 2019-09-15 — End: 2021-05-21

## 2019-09-15 NOTE — Discharge Instructions (Signed)
Take Keflex 4 times a day for the next 3 days Take Tylenol as needed for pain Keep finger clean with soap and water and keep it covered with a bandage until it heals  Return for any worsening symptoms (redness, worsening pain, drainage)

## 2019-09-15 NOTE — ED Provider Notes (Signed)
[facility] EMERGENCY DEPARTMENT Provider Note   CSN: [id number] Arrival date & time: 09/15/19  1113     History Chief Complaint  Patient presents with  . Foreign Body    Anthony Ferguson is a 69 y.o. male with history of HIV and DM who presents with a fish hook in the finger. He was fishing for crappie today when the hook got stuck in his left ring finger. He is having a lot of pain in it which is worse with movement, better with rest. He is unsure of his tetanus status. Last CD4 count was 375 in Oct 2020 but viral load was undetectable.   HPI     Past Medical History:  Diagnosis Date  . HIV (human immunodeficiency virus infection) (HCC)   . Hypercholesteremia   . Hypertension   . Type 2 diabetes mellitus [facility])     Patient Active Problem List   Diagnosis Date Noted  . Erectile dysfunction 11/26/2018  . Cocaine abuse (HCC) 04/14/2015  . Normocytic anemia 04/14/2015  . Thrombocytopenia (HCC) 04/14/2015  . Human immunodeficiency virus (HIV) disease (HCC) 03/03/2006  . Chronic hepatitis C virus infection (HCC) 03/03/2006  . Type 2 diabetes mellitus (HCC) 03/03/2006  . TOBACCO USER 03/03/2006  . Coronary atherosclerosis 03/03/2006    Past Surgical History:  Procedure Laterality Date  . CARDIAC SURGERY    . CIRCUMCISION    . CORONARY ARTERY BYPASS GRAFT         Family History  Problem Relation Age of Onset  . Diabetes Mother   . Heart failure Mother     Social History   Tobacco Use  . Smoking status: Current Every Day Smoker    Packs/day: 0.30    Types: Cigarettes  . Smokeless tobacco: Never Used  . Tobacco comment: Trying to slow down  Vaping Use  . Vaping Use: Never used  Substance Use Topics  . Alcohol use: Yes    Alcohol/week: 5.0 standard drinks    Types: 5 Cans of beer per week    Comment: occas  . Drug use: Not Currently    Types: Cocaine    Comment: last use 07/22/16    Home Medications Prior to Admission medications   Medication Sig  Start Date End Date Taking? Authorizing Provider  apixaban (ELIQUIS) 5 MG TABS tablet Take 1 tablet (5 mg total) by mouth 2 (two) times daily. 07/24/16   Filbert Schilder, MD  bictegravir-emtricitabine-tenofovir AF (BIKTARVY) 50-200-25 MG TABS tablet Take 1 tablet by mouth daily. 11/26/18   Cliffton Asters, MD  cyclobenzaprine (FLEXERIL) 10 MG tablet Take 1 tablet (10 mg total) by mouth 3 (three) times daily. 09/04/17   Ivery Quale, PA-C  diltiazem (CARDIZEM) 30 MG tablet Take 1 tablet (30 mg total) by mouth 3 (three) times daily. 07/24/16   Filbert Schilder, MD  glipiZIDE (GLUCOTROL XL) 10 MG 24 hr tablet Take 1 tablet (10 mg total) by mouth daily with breakfast. 03/14/17   Cliffton Asters, MD  glucose blood test strip Use as instructed 03/14/17   Cliffton Asters, MD  HYDROcodone-acetaminophen (NORCO/VICODIN) 5-325 MG tablet One tablet every four hours as needed for acute pain.  Limit of five days per [place] statue. 12/19/17   Darreld Mclean, MD  lidocaine-prilocaine (EMLA) cream Apply 1 application topically daily. 06/19/16   [provider]  omega-3 acid ethyl esters (LOVAZA) 1 g capsule Take 1 capsule by mouth 2 (two) times daily. 06/19/16   [provider]  pravastatin (PRAVACHOL)  20 MG tablet Take 1 tablet (20 mg total) by mouth daily. 03/07/19   Cliffton Asters, MD  sildenafil (VIAGRA) 25 MG tablet Take 1 tablet (25 mg total) by mouth daily as needed for erectile dysfunction. 12/26/18   Cliffton Asters, MD  tiZANidine (ZANAFLEX) 4 MG tablet One by mouth every night before bed as needed for spasm 11/08/17   Darreld Mclean, MD    Allergies    Hctz [hydrochlorothiazide] and Pork-derived products  Review of Systems   Review of Systems  Musculoskeletal: Positive for arthralgias.  Skin:       +fish hook in finger    Physical Exam Updated Vital Signs BP (!) 139/82 (BP Location: Right Arm)   Pulse 87   Temp 98.5 F (36.9 C) (Oral)   Resp 16   Ht 6\' 3"  (1.905 m)   Wt  77.1 kg   SpO2 98%   BMI 21.25 kg/m   Physical Exam Vitals and nursing note reviewed.  Constitutional:      General: He is not in acute distress.    Appearance: Normal appearance. He is well-developed. He is not ill-appearing.  HENT:     Head: Normocephalic and atraumatic.  Eyes:     General: No scleral icterus.       Right eye: No discharge.        Left eye: No discharge.     Conjunctiva/sclera: Conjunctivae normal.     Pupils: Pupils are equal, round, and reactive to light.  Cardiovascular:     Rate and Rhythm: Normal rate.  Pulmonary:     Effort: Pulmonary effort is normal. No respiratory distress.  Abdominal:     General: There is no distension.  Musculoskeletal:     Cervical back: Normal range of motion.     Comments: Large single barb fish hook that goes through the distal left ring finger  Skin:    General: Skin is warm and dry.  Neurological:     Mental Status: He is alert and oriented to person, place, and time.  Psychiatric:        Behavior: Behavior normal.     ED Results / Procedures / Treatments   Labs (all labs ordered are listed, but only abnormal results are displayed) Labs Reviewed - No data to display  EKG None  Radiology No results found.  Procedures .Foreign Body Removal  Date/Time: 09/15/2019 2:33 PM Performed by: 09/17/2019, PA-C Authorized by: Bethel Born, PA-C  Body area: skin General location: upper extremity Location details: left ring finger Anesthesia: digital block  Anesthesia: Local Anesthetic: lidocaine 1% without epinephrine Anesthetic total: 4 mL  Sedation: Patient sedated: no  Patient restrained: no Patient cooperative: yes Localization method: visualized Removal mechanism: wire cutters. Dressing: dressing applied Tendon involvement: none Depth: subcutaneous Complexity: simple 1 objects recovered. Objects recovered: 1 Post-procedure assessment: foreign body removed Patient tolerance: patient  tolerated the procedure well with no immediate complications   (including critical care time)    Medications Ordered in ED Medications  Tdap (BOOSTRIX) injection 0.5 mL (0.5 mLs Intramuscular Given 09/15/19 1439)  acetaminophen (TYLENOL) tablet 650 mg (650 mg Oral Given 09/15/19 1438)    ED Course  I have reviewed the triage vital signs and the nursing notes.  Pertinent labs & imaging results that were available during my care of the patient were reviewed by me and considered in my medical decision making (see chart for details).  69 year old presents with a fish hook stuck in the  left ring finger. His BP is elevated here but otherwise vitals are normal. The hook goes all the way through the distal aspect of the finger but pt is having a lot of pain and is not tolerating light manipulation of the hook. Digital block was performed with good relief of pain. Shared visit with Dr. Rubin Payor  - wire cutters were used on the one end of the barb and once cut we were able to pull the rest of the hook through. Tdap was updated. He was given Keflex for abx prophlaxis. Wound was cleaned and dressed and he was advised to return for worsening symptoms.  MDM Rules/Calculators/A&P                           Final Clinical Impression(s) / ED Diagnoses Final diagnoses:  Fish hook injury of finger of left hand, initial encounter    Rx / DC Orders ED Discharge Orders    None       Bethel Born, PA-C 09/15/19 1504    Benjiman Core, MD 09/16/19 [id number]

## 2019-09-15 NOTE — ED Triage Notes (Signed)
Pt has a fish hook in left ring finger

## 2019-11-11 ENCOUNTER — Other Ambulatory Visit: Payer: Medicare HMO

## 2019-11-11 ENCOUNTER — Other Ambulatory Visit: Payer: Self-pay

## 2019-11-11 DIAGNOSIS — B20 Human immunodeficiency virus [HIV] disease: Secondary | ICD-10-CM

## 2019-11-25 ENCOUNTER — Encounter: Payer: Medicare HMO | Admitting: Internal Medicine

## 2019-12-05 ENCOUNTER — Other Ambulatory Visit: Payer: Self-pay | Admitting: Internal Medicine

## 2019-12-05 DIAGNOSIS — B182 Chronic viral hepatitis C: Secondary | ICD-10-CM

## 2019-12-09 ENCOUNTER — Other Ambulatory Visit: Payer: Self-pay | Admitting: Internal Medicine

## 2019-12-09 DIAGNOSIS — B182 Chronic viral hepatitis C: Secondary | ICD-10-CM

## 2019-12-17 ENCOUNTER — Ambulatory Visit (INDEPENDENT_AMBULATORY_CARE_PROVIDER_SITE_OTHER): Payer: Medicare HMO | Admitting: Internal Medicine

## 2019-12-17 ENCOUNTER — Encounter: Payer: Self-pay | Admitting: Internal Medicine

## 2019-12-17 ENCOUNTER — Other Ambulatory Visit: Payer: Self-pay

## 2019-12-17 VITALS — BP 158/83 | HR 76 | Wt 168.0 lb

## 2019-12-17 DIAGNOSIS — B182 Chronic viral hepatitis C: Secondary | ICD-10-CM

## 2019-12-17 DIAGNOSIS — B20 Human immunodeficiency virus [HIV] disease: Secondary | ICD-10-CM

## 2019-12-17 DIAGNOSIS — Z23 Encounter for immunization: Secondary | ICD-10-CM | POA: Diagnosis not present

## 2019-12-17 MED ORDER — BIKTARVY 50-200-25 MG PO TABS: 1.0000 | ORAL_TABLET | Freq: Every day | ORAL | 11 refills | Status: DC

## 2019-12-17 NOTE — Assessment & Plan Note (Signed)
His adherence is very good.  I instructed him to always take the full dose of his Biktarvy even if he is going to run out.  He will get repeat blood work today and follow-up in 1 year.  He received his influenza vaccine here today and instructed him to call his local pharmacy to get his Covid vaccine started.  We will help him get a PCP.

## 2019-12-17 NOTE — Progress Notes (Signed)
Patient Active Problem List   Diagnosis Date Noted  . Human immunodeficiency virus (HIV) disease (HCC) 03/03/2006    Priority: High  . Chronic hepatitis C virus infection (HCC) 03/03/2006    Priority: Medium  . Erectile dysfunction 11/26/2018  . Cocaine abuse (HCC) 04/14/2015  . Normocytic anemia 04/14/2015  . Thrombocytopenia (HCC) 04/14/2015  . Type 2 diabetes mellitus (HCC) 03/03/2006  . TOBACCO USER 03/03/2006  . Coronary atherosclerosis 03/03/2006    Patient's Medications  New Prescriptions   No medications on file  Previous Medications   APIXABAN (ELIQUIS) 5 MG TABS TABLET    Take 1 tablet (5 mg total) by mouth 2 (two) times daily.   CEPHALEXIN (KEFLEX) 500 MG CAPSULE    Take 1 capsule (500 mg total) by mouth 4 (four) times daily.   CYCLOBENZAPRINE (FLEXERIL) 10 MG TABLET    Take 1 tablet (10 mg total) by mouth 3 (three) times daily.   DILTIAZEM (CARDIZEM) 30 MG TABLET    Take 1 tablet (30 mg total) by mouth 3 (three) times daily.   GLIPIZIDE (GLUCOTROL XL) 10 MG 24 HR TABLET    Take 1 tablet (10 mg total) by mouth daily with breakfast.   GLUCOSE BLOOD TEST STRIP    Use as instructed   HYDROCODONE-ACETAMINOPHEN (NORCO/VICODIN) 5-325 MG TABLET    One tablet every four hours as needed for acute pain.  Limit of five days per [place] statue.   LIDOCAINE-PRILOCAINE (EMLA) CREAM    Apply 1 application topically daily.   OMEGA-3 ACID ETHYL ESTERS (LOVAZA) 1 G CAPSULE    Take 1 capsule by mouth 2 (two) times daily.   PRAVASTATIN (PRAVACHOL) 20 MG TABLET    Take 1 tablet (20 mg total) by mouth daily.   SILDENAFIL (VIAGRA) 25 MG TABLET    Take 1 tablet (25 mg total) by mouth daily as needed for erectile dysfunction.   TIZANIDINE (ZANAFLEX) 4 MG TABLET    One by mouth every night before bed as needed for spasm  Modified Medications   Modified Medication Previous Medication   BICTEGRAVIR-EMTRICITABINE-TENOFOVIR AF (BIKTARVY) 50-200-25 MG TABS TABLET BIKTARVY 50-200-25 MG  TABS tablet      Take 1 tablet by mouth daily.    TAKE 1 TABLET EVERY DAY.  Discontinued Medications   No medications on file    Subjective: Anthony Ferguson is in for his routine HIV follow-up visit.  He recently had problems getting a refill from his pharmacy causing him to miss a dose of his Biktarvy.  When he knew he was going to run out he broke the pill in half for 2 days before running out.  He has had some recent back pain but otherwise is feeling well.  He continues to smoke cigarettes and has no current plan to quit.  He says that he does not have a current PCP.  He has not taken one of the Covid vaccines yet.  He says that he has had concerns about potential side effects and he has problems with transportation.  Review of Systems: Review of Systems  Constitutional: Negative for fever and weight loss.  Respiratory: Positive for shortness of breath. Negative for cough.   Cardiovascular: Negative for chest pain.  Gastrointestinal: Negative for abdominal pain, diarrhea, nausea and vomiting.  Musculoskeletal: Positive for back pain.  Psychiatric/Behavioral: Negative for depression. The patient is not nervous/anxious.     Past Medical History:  Diagnosis Date  . HIV (human immunodeficiency  virus infection) (HCC)   . Hypercholesteremia   . Hypertension   . Type 2 diabetes mellitus (HCC)     Social History   Tobacco Use  . Smoking status: Current Every Day Smoker    Packs/day: 0.30    Types: Cigarettes  . Smokeless tobacco: Never Used  . Tobacco comment: Trying to slow down  Vaping Use  . Vaping Use: Never used  Substance Use Topics  . Alcohol use: Yes    Alcohol/week: 5.0 standard drinks    Types: 5 Cans of beer per week    Comment: occas  . Drug use: Not Currently    Types: Cocaine    Comment: last use 07/22/16    Family History  Problem Relation Age of Onset  . Diabetes Mother   . Heart failure Mother     Allergies  Allergen Reactions  . Hctz [Hydrochlorothiazide]  Other (See Comments)    "ran my sugar up"  . Pork-Derived Products Palpitations and Hypertension    Health Maintenance  Topic Date Due  . FOOT EXAM  Never done  . OPHTHALMOLOGY EXAM  Never done  . URINE MICROALBUMIN  Never done  . COVID-19 Vaccine (1) Never done  . COLONOSCOPY  Never done  . HEMOGLOBIN A1C  10/16/2011  . PNA vac Low Risk Adult (2 of 2 - PPSV23) 03/20/2019  . INFLUENZA VACCINE  09/21/2019  . TETANUS/TDAP  09/14/2029  . Hepatitis C Screening  Completed    Objective:  Vitals:   12/17/19 0914  BP: (!) 158/83  Pulse: 76  SpO2: 100%  Weight: 168 lb (76.2 kg)   Body mass index is 21 kg/m.  Physical Exam Constitutional:      Comments: He is in good spirits.  Cardiovascular:     Rate and Rhythm: Normal rate and regular rhythm.     Heart sounds: No murmur heard.   Pulmonary:     Effort: Pulmonary effort is normal.     Breath sounds: Normal breath sounds.  Abdominal:     Palpations: Abdomen is soft.     Tenderness: There is no abdominal tenderness.  Psychiatric:        Mood and Affect: Mood normal.     Lab Results Lab Results  Component Value Date   WBC 3.2 (L) 03/19/2018   HGB 15.4 03/19/2018   HCT 44.2 03/19/2018   MCV 93.2 03/19/2018   PLT 165 03/19/2018    Lab Results  Component Value Date   CREATININE 1.11 03/19/2018   BUN 19 03/19/2018   NA 139 03/19/2018   K 4.4 03/19/2018   CL 100 03/19/2018   CO2 31 03/19/2018    Lab Results  Component Value Date   ALT 11 03/19/2018   AST 17 03/19/2018   GGT 251 (H) 11/16/2016   ALKPHOS 103 10/12/2016   BILITOT 0.7 03/19/2018    Lab Results  Component Value Date   CHOL 189 03/19/2018   HDL 89 03/19/2018   LDLCALC 83 03/19/2018   TRIG 79 03/19/2018   CHOLHDL 2.1 03/19/2018   Lab Results  Component Value Date   LABRPR NON-REACTIVE 03/19/2018   HIV 1 RNA Quant (copies/mL)  Date Value  11/26/2018 <20 DETECTED (A)  03/19/2018 <20 NOT DETECTED  09/25/2017 <20 DETECTED (A)   CD4 T  Cell Abs (/uL)  Date Value  11/26/2018 375 (L)  03/19/2018 480  09/25/2017 460     Problem List Items Addressed This Visit      High  Human immunodeficiency virus (HIV) disease (HCC)    His adherence is very good.  I instructed him to always take the full dose of his Biktarvy even if he is going to run out.  He will get repeat blood work today and follow-up in 1 year.  He received his influenza vaccine here today and instructed him to call his local pharmacy to get his Covid vaccine started.  We will help him get a PCP.      Relevant Medications   bictegravir-emtricitabine-tenofovir AF (BIKTARVY) 50-200-25 MG TABS tablet   Other Relevant Orders   T-helper cell (CD4)- ([facility] only)   HIV-1 RNA quant-no reflex-bld   CBC   Comprehensive metabolic panel   RPR   Lipid panel     Medium   Chronic hepatitis C virus infection (HCC)   Relevant Medications   bictegravir-emtricitabine-tenofovir AF (BIKTARVY) 50-200-25 MG TABS tablet        Cliffton Asters, MD [facility] [phone] pager   [phone] cell 12/17/2019, 9:38 AM

## 2019-12-18 LAB — T-HELPER CELL (CD4) - (RCID CLINIC ONLY)
CD4 % Helper T Cell: 40 % (ref 33–65)
CD4 T Cell Abs: 320 /uL — ABNORMAL LOW (ref 400–1790)

## 2019-12-22 LAB — COMPREHENSIVE METABOLIC PANEL
AG Ratio: 1.5 (calc) (ref 1.0–2.5)
ALT: 11 U/L (ref 9–46)
AST: 18 U/L (ref 10–35)
Albumin: 4 g/dL (ref 3.6–5.1)
Alkaline phosphatase (APISO): 77 U/L (ref 35–144)
BUN: 15 mg/dL (ref 7–25)
CO2: 27 mmol/L (ref 20–32)
Calcium: 9.4 mg/dL (ref 8.6–10.3)
Chloride: 107 mmol/L (ref 98–110)
Creat: 0.91 mg/dL (ref 0.70–1.18)
Globulin: 2.7 g/dL (calc) (ref 1.9–3.7)
Glucose, Bld: 232 mg/dL — ABNORMAL HIGH (ref 65–99)
Potassium: 4.2 mmol/L (ref 3.5–5.3)
Sodium: 140 mmol/L (ref 135–146)
Total Bilirubin: 0.6 mg/dL (ref 0.2–1.2)
Total Protein: 6.7 g/dL (ref 6.1–8.1)

## 2019-12-22 LAB — RPR: RPR Ser Ql: NONREACTIVE

## 2019-12-22 LAB — CBC
HCT: 40.2 % (ref 38.5–50.0)
Hemoglobin: 13.7 g/dL (ref 13.2–17.1)
MCH: 31.6 pg (ref 27.0–33.0)
MCHC: 34.1 g/dL (ref 32.0–36.0)
MCV: 92.8 fL (ref 80.0–100.0)
MPV: 11.1 fL (ref 7.5–12.5)
Platelets: 151 10*3/uL (ref 140–400)
RBC: 4.33 10*6/uL (ref 4.20–5.80)
RDW: 12.6 % (ref 11.0–15.0)
WBC: 2.7 10*3/uL — ABNORMAL LOW (ref 3.8–10.8)

## 2019-12-22 LAB — HIV-1 RNA QUANT-NO REFLEX-BLD
HIV 1 RNA Quant: <20 Copies/mL
HIV-1 RNA Quant, Log: <1.3 Log cps/mL

## 2019-12-22 LAB — LIPID PANEL
Cholesterol: 191 mg/dL (ref <200)
HDL: 101 mg/dL (ref >=40)
LDL Cholesterol (Calc): 77 mg/dL (calc)
Non-HDL Cholesterol (Calc): 90 mg/dL (calc) (ref <130)
Total CHOL/HDL Ratio: 1.9 (calc) (ref <5.0)
Triglycerides: 53 mg/dL (ref <150)

## 2020-01-23 ENCOUNTER — Other Ambulatory Visit: Payer: Self-pay | Admitting: Internal Medicine

## 2020-01-23 DIAGNOSIS — N529 Male erectile dysfunction, unspecified: Secondary | ICD-10-CM

## 2020-04-22 ENCOUNTER — Other Ambulatory Visit: Payer: Self-pay | Admitting: Internal Medicine

## 2020-04-22 DIAGNOSIS — N529 Male erectile dysfunction, unspecified: Secondary | ICD-10-CM

## 2020-06-21 ENCOUNTER — Other Ambulatory Visit: Payer: Self-pay | Admitting: Internal Medicine

## 2020-06-21 DIAGNOSIS — N529 Male erectile dysfunction, unspecified: Secondary | ICD-10-CM

## 2020-07-05 ENCOUNTER — Other Ambulatory Visit: Payer: Self-pay | Admitting: Internal Medicine

## 2020-07-05 DIAGNOSIS — N529 Male erectile dysfunction, unspecified: Secondary | ICD-10-CM

## 2020-12-16 ENCOUNTER — Ambulatory Visit: Payer: Medicare HMO | Admitting: Internal Medicine

## 2020-12-27 ENCOUNTER — Telehealth: Payer: Self-pay

## 2020-12-27 ENCOUNTER — Other Ambulatory Visit: Payer: Self-pay | Admitting: Internal Medicine

## 2020-12-27 DIAGNOSIS — B182 Chronic viral hepatitis C: Secondary | ICD-10-CM

## 2020-12-27 NOTE — Telephone Encounter (Signed)
Attempted to call patient to schedule overdue appt. Not able to reach him at this time. Voicemail is not set up. Juanita Laster, RMA

## 2020-12-29 ENCOUNTER — Telehealth: Payer: Self-pay

## 2020-12-29 NOTE — Telephone Encounter (Signed)
Voice mail has not been set up to schedule a follow up visit with Dr. Orvan Falconer

## 2020-12-29 NOTE — Telephone Encounter (Signed)
-----   Message from Juanita Laster, [place] sent at 12/27/2020 11:45 AM EST ----- Regarding: appt Needs appt with Jonny Ruiz as soon as possible. Juanita Laster, RMA

## 2021-02-16 ENCOUNTER — Telehealth: Payer: Self-pay

## 2021-02-16 NOTE — Telephone Encounter (Signed)
-----   Message from Cecelia M Martinez, RMA sent at 12/27/2020 11:45 AM EST ----- Regarding: appt Needs appt with John as soon as possible. Cecelia M Martinez, RMA   

## 2021-02-16 NOTE — Telephone Encounter (Signed)
Have call patient multiple time to schedule a follow up appointment with Dr. Orvan Falconer, Voice mail has not been set up

## 2021-02-28 ENCOUNTER — Other Ambulatory Visit: Payer: Self-pay | Admitting: Internal Medicine

## 2021-02-28 DIAGNOSIS — B182 Chronic viral hepatitis C: Secondary | ICD-10-CM

## 2021-04-20 ENCOUNTER — Other Ambulatory Visit (HOSPITAL_COMMUNITY): Payer: Self-pay

## 2021-05-19 ENCOUNTER — Inpatient Hospital Stay (HOSPITAL_COMMUNITY)
Admission: EM | Admit: 2021-05-19 | Discharge: 2021-05-26 | DRG: 023 | Disposition: A | Discharge status: Skilled Nursing Facility | Payer: Medicare HMO | Attending: Neurology | Admitting: Neurology

## 2021-05-19 ENCOUNTER — Emergency Department (HOSPITAL_COMMUNITY): Payer: Medicare HMO

## 2021-05-19 ENCOUNTER — Encounter (HOSPITAL_COMMUNITY): Payer: Self-pay | Admitting: Emergency Medicine

## 2021-05-19 ENCOUNTER — Encounter (HOSPITAL_COMMUNITY)
Admission: EM | Disposition: A | Discharge status: Skilled Nursing Facility | Payer: Self-pay | Source: Home / Self Care | Attending: Neurology

## 2021-05-19 DIAGNOSIS — E43 Unspecified severe protein-calorie malnutrition: Secondary | ICD-10-CM | POA: Diagnosis present

## 2021-05-19 DIAGNOSIS — I2510 Atherosclerotic heart disease of native coronary artery without angina pectoris: Secondary | ICD-10-CM | POA: Diagnosis present

## 2021-05-19 DIAGNOSIS — Z681 Body mass index (BMI) 19 or less, adult: Secondary | ICD-10-CM | POA: Diagnosis not present

## 2021-05-19 DIAGNOSIS — R4701 Aphasia: Secondary | ICD-10-CM | POA: Diagnosis present

## 2021-05-19 DIAGNOSIS — G8191 Hemiplegia, unspecified affecting right dominant side: Secondary | ICD-10-CM | POA: Diagnosis present

## 2021-05-19 DIAGNOSIS — I10 Essential (primary) hypertension: Secondary | ICD-10-CM | POA: Diagnosis present

## 2021-05-19 DIAGNOSIS — Z20822 Contact with and (suspected) exposure to covid-19: Secondary | ICD-10-CM | POA: Diagnosis present

## 2021-05-19 DIAGNOSIS — S0081XA Abrasion of other part of head, initial encounter: Secondary | ICD-10-CM | POA: Diagnosis present

## 2021-05-19 DIAGNOSIS — W07XXXA Fall from chair, initial encounter: Secondary | ICD-10-CM | POA: Diagnosis present

## 2021-05-19 DIAGNOSIS — I63512 Cerebral infarction due to unspecified occlusion or stenosis of left middle cerebral artery: Secondary | ICD-10-CM

## 2021-05-19 DIAGNOSIS — F1410 Cocaine abuse, uncomplicated: Secondary | ICD-10-CM | POA: Diagnosis present

## 2021-05-19 DIAGNOSIS — F10929 Alcohol use, unspecified with intoxication, unspecified: Secondary | ICD-10-CM | POA: Diagnosis not present

## 2021-05-19 DIAGNOSIS — R29810 Facial weakness: Secondary | ICD-10-CM | POA: Diagnosis present

## 2021-05-19 DIAGNOSIS — Z79899 Other long term (current) drug therapy: Secondary | ICD-10-CM

## 2021-05-19 DIAGNOSIS — Z7901 Long term (current) use of anticoagulants: Secondary | ICD-10-CM

## 2021-05-19 DIAGNOSIS — F10129 Alcohol abuse with intoxication, unspecified: Secondary | ICD-10-CM | POA: Diagnosis present

## 2021-05-19 DIAGNOSIS — Z833 Family history of diabetes mellitus: Secondary | ICD-10-CM | POA: Diagnosis not present

## 2021-05-19 DIAGNOSIS — I4819 Other persistent atrial fibrillation: Secondary | ICD-10-CM | POA: Diagnosis present

## 2021-05-19 DIAGNOSIS — I639 Cerebral infarction, unspecified: Secondary | ICD-10-CM | POA: Diagnosis not present

## 2021-05-19 DIAGNOSIS — Z21 Asymptomatic human immunodeficiency virus [HIV] infection status: Secondary | ICD-10-CM | POA: Diagnosis present

## 2021-05-19 DIAGNOSIS — Z4682 Encounter for fitting and adjustment of non-vascular catheter: Secondary | ICD-10-CM | POA: Diagnosis not present

## 2021-05-19 DIAGNOSIS — E1165 Type 2 diabetes mellitus with hyperglycemia: Secondary | ICD-10-CM | POA: Diagnosis present

## 2021-05-19 DIAGNOSIS — E871 Hypo-osmolality and hyponatremia: Secondary | ICD-10-CM | POA: Diagnosis present

## 2021-05-19 DIAGNOSIS — Z8249 Family history of ischemic heart disease and other diseases of the circulatory system: Secondary | ICD-10-CM

## 2021-05-19 DIAGNOSIS — I6602 Occlusion and stenosis of left middle cerebral artery: Secondary | ICD-10-CM | POA: Diagnosis present

## 2021-05-19 DIAGNOSIS — I63312 Cerebral infarction due to thrombosis of left middle cerebral artery: Secondary | ICD-10-CM | POA: Diagnosis not present

## 2021-05-19 DIAGNOSIS — E7800 Pure hypercholesterolemia, unspecified: Secondary | ICD-10-CM | POA: Diagnosis present

## 2021-05-19 DIAGNOSIS — S022XXA Fracture of nasal bones, initial encounter for closed fracture: Secondary | ICD-10-CM | POA: Diagnosis not present

## 2021-05-19 DIAGNOSIS — Y92007 Garden or yard of unspecified non-institutional (private) residence as the place of occurrence of the external cause: Secondary | ICD-10-CM | POA: Diagnosis not present

## 2021-05-19 DIAGNOSIS — I63212 Cerebral infarction due to unspecified occlusion or stenosis of left vertebral arteries: Secondary | ICD-10-CM | POA: Diagnosis not present

## 2021-05-19 DIAGNOSIS — I6502 Occlusion and stenosis of left vertebral artery: Secondary | ICD-10-CM | POA: Diagnosis not present

## 2021-05-19 DIAGNOSIS — R5381 Other malaise: Secondary | ICD-10-CM | POA: Diagnosis not present

## 2021-05-19 DIAGNOSIS — R29720 NIHSS score 20: Secondary | ICD-10-CM | POA: Diagnosis present

## 2021-05-19 DIAGNOSIS — I63412 Cerebral infarction due to embolism of left middle cerebral artery: Principal | ICD-10-CM | POA: Diagnosis present

## 2021-05-19 DIAGNOSIS — S0990XA Unspecified injury of head, initial encounter: Secondary | ICD-10-CM | POA: Diagnosis not present

## 2021-05-19 DIAGNOSIS — Z8673 Personal history of transient ischemic attack (TIA), and cerebral infarction without residual deficits: Secondary | ICD-10-CM | POA: Diagnosis not present

## 2021-05-19 DIAGNOSIS — Z7984 Long term (current) use of oral hypoglycemic drugs: Secondary | ICD-10-CM

## 2021-05-19 DIAGNOSIS — Z951 Presence of aortocoronary bypass graft: Secondary | ICD-10-CM | POA: Diagnosis not present

## 2021-05-19 DIAGNOSIS — M47816 Spondylosis without myelopathy or radiculopathy, lumbar region: Secondary | ICD-10-CM | POA: Diagnosis not present

## 2021-05-19 DIAGNOSIS — F17210 Nicotine dependence, cigarettes, uncomplicated: Secondary | ICD-10-CM | POA: Diagnosis present

## 2021-05-19 DIAGNOSIS — E119 Type 2 diabetes mellitus without complications: Secondary | ICD-10-CM | POA: Diagnosis not present

## 2021-05-19 DIAGNOSIS — I6523 Occlusion and stenosis of bilateral carotid arteries: Secondary | ICD-10-CM | POA: Diagnosis not present

## 2021-05-19 DIAGNOSIS — I517 Cardiomegaly: Secondary | ICD-10-CM | POA: Diagnosis not present

## 2021-05-19 HISTORY — PX: RADIOLOGY WITH ANESTHESIA: SHX6223

## 2021-05-19 LAB — BASIC METABOLIC PANEL
Anion gap: 6 (ref 5–15)
BUN: 13 mg/dL (ref 8–23)
CO2: 24 mmol/L (ref 22–32)
Calcium: 9.2 mg/dL (ref 8.9–10.3)
Chloride: 107 mmol/L (ref 98–111)
Creatinine, Ser: 1 mg/dL (ref 0.61–1.24)
GFR, Estimated: >60 mL/min (ref >60)
Glucose, Bld: 195 mg/dL — ABNORMAL HIGH (ref 70–99)
Potassium: 3.9 mmol/L (ref 3.5–5.1)
Sodium: 137 mmol/L (ref 135–145)

## 2021-05-19 LAB — RAPID URINE DRUG SCREEN, HOSP PERFORMED
Amphetamines: NOT DETECTED
Barbiturates: NOT DETECTED
Benzodiazepines: NOT DETECTED
Cocaine: POSITIVE — AB
Opiates: NOT DETECTED
Tetrahydrocannabinol: NOT DETECTED

## 2021-05-19 LAB — APTT: aPTT: 34 seconds (ref 24–36)

## 2021-05-19 LAB — URINALYSIS, ROUTINE W REFLEX MICROSCOPIC
Bacteria, UA: NONE SEEN
Bilirubin Urine: NEGATIVE
Glucose, UA: 50 mg/dL — AB
Ketones, ur: NEGATIVE mg/dL
Leukocytes,Ua: NEGATIVE
Nitrite: NEGATIVE
Protein, ur: NEGATIVE mg/dL
Specific Gravity, Urine: 1.005 (ref 1.005–1.030)
pH: 6 (ref 5.0–8.0)

## 2021-05-19 LAB — CBC WITH DIFFERENTIAL/PLATELET
Abs Immature Granulocytes: 0 10*3/uL (ref 0.00–0.07)
Basophils Absolute: 0 10*3/uL (ref 0.0–0.1)
Basophils Relative: 0 %
Eosinophils Absolute: 0.1 10*3/uL (ref 0.0–0.5)
Eosinophils Relative: 2 %
HCT: 41 % (ref 39.0–52.0)
Hemoglobin: 13.6 g/dL (ref 13.0–17.0)
Immature Granulocytes: 0 %
Lymphocytes Relative: 22 %
Lymphs Abs: 0.7 10*3/uL (ref 0.7–4.0)
MCH: 30.4 pg (ref 26.0–34.0)
MCHC: 33.2 g/dL (ref 30.0–36.0)
MCV: 91.7 fL (ref 80.0–100.0)
Monocytes Absolute: 0.3 10*3/uL (ref 0.1–1.0)
Monocytes Relative: 9 %
Neutro Abs: 2.2 10*3/uL (ref 1.7–7.7)
Neutrophils Relative %: 67 %
Platelets: 134 10*3/uL — ABNORMAL LOW (ref 150–400)
RBC: 4.47 MIL/uL (ref 4.22–5.81)
RDW: 13.3 % (ref 11.5–15.5)
WBC: 3.3 10*3/uL — ABNORMAL LOW (ref 4.0–10.5)
nRBC: 0 % (ref 0.0–0.2)

## 2021-05-19 LAB — ETHANOL: Alcohol, Ethyl (B): <10 mg/dL (ref <10)

## 2021-05-19 LAB — PROTIME-INR
INR: 1.1 (ref 0.8–1.2)
Prothrombin Time: 13.9 seconds (ref 11.4–15.2)

## 2021-05-19 LAB — CBG MONITORING, ED: Glucose-Capillary: 149 mg/dL — ABNORMAL HIGH (ref 70–99)

## 2021-05-19 SURGERY — IR WITH ANESTHESIA
Anesthesia: General

## 2021-05-19 MED ORDER — IOHEXOL 350 MG/ML SOLN
100.0000 mL | Freq: Once | INTRAVENOUS | Status: AC | PRN
  Administered 2021-05-19: 100 mL via INTRAVENOUS

## 2021-05-19 MED ORDER — LABETALOL HCL 5 MG/ML IV SOLN
10.0000 mg | Freq: Once | INTRAVENOUS | Status: AC
Start: 2021-05-19 — End: 2021-05-19
  Administered 2021-05-19: 10 mg via INTRAVENOUS
  Filled 2021-05-19: qty 4

## 2021-05-19 NOTE — ED Notes (Signed)
Pt came in with bed bugs present- clothing removed and placed in pt belonging bag, clean gown placed on pt, linens changed, towel placed beneath doorway per protocol.  ?

## 2021-05-19 NOTE — ED Triage Notes (Signed)
Pt arrives [facility] from home after he was outside and slide out of his chair hitting the right side of his face. Small abrasion noted. ETOH on board.  ?

## 2021-05-19 NOTE — ED Notes (Signed)
Dr Posey Rea at bedside, talking with neuro team at [facility] and pt friend via speaker phone- Marshell Garfinkel- number [phone]) ?

## 2021-05-19 NOTE — ED Provider Notes (Signed)
? EMERGENCY DEPARTMENT ?Provider Note ? ? ?CSN: [id number] ?Arrival date & time: 05/19/21  1950 ? ?An emergency department physician performed an initial assessment on this suspected stroke patient at 2038. ? ?History ? ?Chief Complaint  ?Patient presents with  ? Alcohol Intoxication  ? ?LEVEL 5 CAVEAT - ALCOHOL INTOXICATION ? ?Anthony Ferguson is a 71 y.o. male who presents to the ED today via EMS for alcohol intoxication.  History significantly limited due to alcohol on board.   ? ?Per EMS patient from home he was outside and slid out of his chair hitting the right side of his face.  He had a small abrasion noted.  No other information provided.  ? ?The history is provided by the patient, the EMS personnel and medical records.  ? ?  ? ?Home Medications ?Prior to Admission medications   ?Medication Sig Start Date End Date Taking? Authorizing Provider  ?apixaban (ELIQUIS) 5 MG TABS tablet Take 1 tablet (5 mg total) by mouth 2 (two) times daily. ?Patient not taking: Reported on 12/17/2019 07/24/16   Langston Reusing, MD  ?Susanne Borders [id number] MG TABS tablet TAKE 1 TABLET EVERY DAY. 02/28/21   Cliffton Asters, MD  ?cephALEXin (KEFLEX) 500 MG capsule Take 1 capsule (500 mg total) by mouth 4 (four) times daily. ?Patient not taking: Reported on 12/17/2019 09/15/19   Bethel Born, PA-C  ?cyclobenzaprine (FLEXERIL) 10 MG tablet Take 1 tablet (10 mg total) by mouth 3 (three) times daily. ?Patient not taking: Reported on 12/17/2019 09/04/17   Ivery Quale, PA-C  ?diltiazem (CARDIZEM) 30 MG tablet Take 1 tablet (30 mg total) by mouth 3 (three) times daily. 07/24/16   Langston Reusing, MD  ?glipiZIDE (GLUCOTROL XL) 10 MG 24 hr tablet Take 1 tablet (10 mg total) by mouth daily with breakfast. 03/14/17   Cliffton Asters, MD  ?glucose blood test strip Use as instructed 03/14/17   Cliffton Asters, MD  ?HYDROcodone-acetaminophen (NORCO/VICODIN) 5-325 MG tablet One tablet every four hours as needed for acute pain.  Limit  of five days per [place]. ?Patient not taking: Reported on 12/17/2019 12/19/17   Darreld Mclean, MD  ?lidocaine-prilocaine (EMLA) cream Apply 1 application topically daily. ?Patient not taking: Reported on 12/17/2019 06/19/16   [provider]  ?omega-3 acid ethyl esters (LOVAZA) 1 g capsule Take 1 capsule by mouth 2 (two) times daily. ?Patient not taking: Reported on 12/17/2019 06/19/16   [provider]  ?pravastatin (PRAVACHOL) 20 MG tablet Take 1 tablet (20 mg total) by mouth daily. ?Patient not taking: Reported on 12/17/2019 03/07/19   Cliffton Asters, MD  ?sildenafil (VIAGRA) 25 MG tablet TAKE (1) TABLET BY MOUTH DAILY AS NEEDED FOR ERECTILE DYSFUNCTION. 07/08/20   Cliffton Asters, MD  ?tiZANidine (ZANAFLEX) 4 MG tablet One by mouth every night before bed as needed for spasm ?Patient not taking: Reported on 12/17/2019 11/08/17   Darreld Mclean, MD  ?   ? ?Allergies    ?Hctz [hydrochlorothiazide] and Pork-derived products   ? ?Review of Systems   ?Review of Systems  ?Unable to perform ROS: Other  ? ?Physical Exam ?Updated Vital Signs ?BP (!) 196/95   Pulse 82   Temp 97.6 ?F (36.4 ?C) (Oral)   Resp 14   Ht 6\' 3"  (1.905 m)   Wt 76.2 kg   SpO2 100%   BMI 21.00 kg/m?  ?Physical Exam ?Vitals and nursing note reviewed.  ?Constitutional:   ?   Appearance: He is not ill-appearing.  ?  Comments: Appears intoxicated  ?HENT:  ?   Head: Normocephalic.  ?   Comments: Abrasion noted to R zygomatic arch with associated TTP ?Eyes:  ?   Conjunctiva/sclera: Conjunctivae normal.  ?Cardiovascular:  ?   Rate and Rhythm: Normal rate and regular rhythm.  ?Pulmonary:  ?   Effort: Pulmonary effort is normal.  ?   Breath sounds: Normal breath sounds.  ?Abdominal:  ?   Palpations: Abdomen is soft.  ?   Tenderness: There is no abdominal tenderness.  ?Musculoskeletal:  ?   Cervical back: Neck supple.  ?Skin: ?   General: Skin is warm and dry.  ?Neurological:  ?   Mental Status: He is alert.  ? ? ?ED Results /  Procedures / Treatments   ?Labs ?(all labs ordered are listed, but only abnormal results are displayed) ?Labs Reviewed  ?CBC WITH DIFFERENTIAL/PLATELET - Abnormal; Notable for the following components:  ?    Result Value  ? WBC 3.3 (*)   ? Platelets 134 (*)   ? All other components within normal limits  ?BASIC METABOLIC PANEL - Abnormal; Notable for the following components:  ? Glucose, Bld 195 (*)   ? All other components within normal limits  ?URINALYSIS, ROUTINE W REFLEX MICROSCOPIC - Abnormal; Notable for the following components:  ? Color, Urine COLORLESS (*)   ? Glucose, UA 50 (*)   ? Hgb urine dipstick SMALL (*)   ? All other components within normal limits  ?CBG MONITORING, ED - Abnormal; Notable for the following components:  ? Glucose-Capillary 149 (*)   ? All other components within normal limits  ?RESP PANEL BY RT-PCR (FLU A&B, COVID) ARPGX2  ?ETHANOL  ?PROTIME-INR  ?APTT  ?RAPID URINE DRUG SCREEN, HOSP PERFORMED  ?I-STAT CHEM 8, ED  ? ? ?EKG ?None ? ?Radiology ?CT Head Wo Contrast ? ?Result Date: 05/19/2021 ?CLINICAL DATA:  head injury. intoxicated; facial trauma EXAM: CT HEAD WITHOUT CONTRAST CT MAXILLOFACIAL WITHOUT CONTRAST TECHNIQUE: Multidetector CT imaging of the head and maxillofacial structures were performed using the standard protocol without intravenous contrast. Multiplanar CT image reconstructions of the maxillofacial structures were also generated. RADIATION DOSE REDUCTION: This exam was performed according to the departmental dose-optimization program which includes automated exposure control, adjustment of the mA and/or kV according to patient size and/or use of iterative reconstruction technique. COMPARISON:  None. FINDINGS: CT HEAD FINDINGS BRAIN: BRAIN Patchy and confluent areas of decreased attenuation are noted throughout the deep and periventricular white matter of the cerebral hemispheres bilaterally, compatible with chronic microvascular ischemic disease. No evidence of  large-territorial acute infarction. No parenchymal hemorrhage. No mass lesion. No extra-axial collection. No mass effect or midline shift. No hydrocephalus. Basilar cisterns are patent. Vascular: No hyperdense vessel. Atherosclerotic calcifications are present within the cavernous internal carotid arteries. Skull: No acute fracture or focal lesion. Other: None. CT MAXILLOFACIAL FINDINGS Osseous: Age-indeterminate minimally displaced right nasal bone fracture. No definite acute displaced fracture or mandibular dislocation. No destructive process. Patient is edentulous. Sinuses/Orbits: Paranasal sinuses and mastoid air cells are clear. The orbits are unremarkable. Soft tissues: Carotid artery calcification within the neck. Visualized upper cervical spine: Bulky osteophyte formation. IMPRESSION: 1. No acute intracranial abnormality. 2. Age-indeterminate minimally displaced right nasal bone fracture. Otherwise no acute displaced facial fracture. Electronically Signed   By: Tish FredericksonMorgane  Naveau M.D.   On: 05/19/2021 22:49  ? ?CT Maxillofacial Wo Contrast ? ?Result Date: 05/19/2021 ?CLINICAL DATA:  head injury. intoxicated; facial trauma EXAM: CT HEAD WITHOUT CONTRAST CT MAXILLOFACIAL WITHOUT  CONTRAST TECHNIQUE: Multidetector CT imaging of the head and maxillofacial structures were performed using the standard protocol without intravenous contrast. Multiplanar CT image reconstructions of the maxillofacial structures were also generated. RADIATION DOSE REDUCTION: This exam was performed according to the departmental dose-optimization program which includes automated exposure control, adjustment of the mA and/or kV according to patient size and/or use of iterative reconstruction technique. COMPARISON:  None. FINDINGS: CT HEAD FINDINGS BRAIN: BRAIN Patchy and confluent areas of decreased attenuation are noted throughout the deep and periventricular white matter of the cerebral hemispheres bilaterally, compatible with chronic  microvascular ischemic disease. No evidence of large-territorial acute infarction. No parenchymal hemorrhage. No mass lesion. No extra-axial collection. No mass effect or midline shift. No hydrocephalus. Basilar cisterns are p

## 2021-05-19 NOTE — ED Notes (Addendum)
CT and xrays are not crossing over - neurologist unable to view images, Einar Pheasant, RN has taken stroke cart to CT so neurologist can view images, then brought back to room- Dr Rada Hay recommends CT angio STAT-  ?

## 2021-05-19 NOTE — ED Notes (Signed)
Patient transported to CT 

## 2021-05-19 NOTE — ED Notes (Addendum)
Selena Batten, RN to transport pt on monitor- transporting to CT ?

## 2021-05-19 NOTE — ED Notes (Signed)
After putting in skin assessment, this nurse started asking pt questions, noticed he was repeating same phrase over and over, observed pt had no movement to right arm or leg, Dr Matilde Sprang made aware, Code stroke activated.  ?

## 2021-05-19 NOTE — Consult Note (Addendum)
[facility] ?[facility] Tele[facility] Consult Services ? ?ADDENDUM: There is left M1 occlusion per my read. Per hospital protocol, the ED will contact NIR.  I spoke to the ED. ? ?Patient Name:   Anthony Ferguson, Anthony Ferguson ?Date of Birth:   01-Jan-1951 ?Identification Number:   MRN - [id number] ?Date of Service:   05/19/2021 22:05:42 ? ?Diagnosis: ?      I63.9 - Cerebrovascular accident (CVA), unspecified mechanism ([place]) ? ?Impression: ?     71 year old male with prior CVA, HIV, HTN and DM who was drinking alcohol and fell out of his chair with a superficial bruise (Per EMS). He was mentally intact but noted to have a right facial droop when he arrived at [place]. At 2200, he is just repeating his name and not moving the right side. NIHSS 20. Head CT showed no hemorrhage (possible hyperdense left MCA sign). ?  ? His presentation is concerning for left MCA CVA. See thrombolytics discussion below. We will proceed with stat CTA and involve NIR if there is LVO (I have alerted ED physician of high pretest suspicion to start the process). Otherwise, the patient will need the traditional medical and stroke management and workup (see below). ? ?Our recommendations are outlined below. ? ?Recommendations IF NO LVO: ? ?      Stroke/Telemetry Floor ?      Neuro Checks ?      Bedside Swallow Eval ?      DVT Prophylaxis ?      IV Fluids, Normal Saline ?      Head of Bed 30 Degrees ?      Euglycemia and Avoid Hyperthermia (PRN Acetaminophen) ?      Initiate or continue Aspirin 81 MG daily ? ?Per facility request will defer further work up, management, and referrals to inpatient service, inclusive of inpatient neurology consult. ? ? ? ?------------------------------------------------------------------------------ ? ?Advanced Imaging: ?CTA pending ? ? ?Metrics: ?Last Known Well: Unknown ?[facility] Notification Time: 05/19/2021 22:05:42 ?Stamp Time: 05/19/2021 22:05:42 ?Initial Response Time: 05/19/2021 22:16:48 ?Symptoms: right  weakness. ?NIHSS Start Assessment Time: 05/19/2021 22:18:18 ?Patient is not a candidate for Thrombolytic. ?Thrombolytic Medical Decision: 05/19/2021 22:21:06 ?Patient was not deemed candidate for Thrombolytic because of following reasons: ?Care team unable to determine eligibility. ?Unfortunately, the patient arrives after a head trauma with delayed onset neurologic symptoms. When he arrived, he was already noted to have right facial droop so we don't have a clear onset time. He cannot provide a reliable history of contraindications or consent. For example, the chart lists Eliquis in a note from 2021 but there is a note of nonadherence. So it is unclear if he is taking. I tried calling the number for next of kin (daughter Ronny Bacon but the phone number is not in service). Given all this, I did not feel comfortable proceeding with thrombolytics. The ED physician was in complete agreement and had come to same independent conclusion. ? ?I personally Reviewed the CT Head and it showed no hemorrhage (possible hyperdense left MCA sign) ? ?ED Physician notified of diagnostic impression and management plan on 05/19/2021 22:42:00 ? ? ? ?------------------------------------------------------------------------------ ? ?History of Present Illness: ?Patient is a 71 year old Male. ? ?71 year old male with prior CVA, HIV, HTN and DM who was drinking alcohol and fell out of his chair with a superficial bruise. He was mentally intact but noted to have a right facial droop when he arrived at [place]. At 2200, he was  just repeating his name and not moving the right side. ? ?He  cannot provide a history. The chart lists Eliquis but there is a note of nonadherence from 2021. ? ?He apparently lives alone. ? ?I tried calling the number for next of kin (daughter Ronny Bacon but the phone number is not in service). ? ?  ? ?Past Medical History: ?     Hypertension ?     Diabetes Mellitus ? ?Medications: ? ?Anticoagulant use:  Unknown ?Antiplatelet  use: Unknown ?Other Medications Pertinent To Assessment Include: The chart lists Eliquis but there is a note of nonadherence from 2021. ? ?Allergies:  ?Reviewed ?Description: HCTZ, Pork ? ?Social History: ?Unable To Obtain Due To Patient Status : Patient Is aphasic ? ?Family History: ? ?Family History Cannot Be Obtained Because:Patient Is aphasic ? ?ROS : ROS Cannot Be Obtained Because:  Patient Is aphasic ?  ? ?  ? ?Examination: ?BP(192/113), Pulse(60), ?1A: Level of Consciousness - Alert; keenly responsive + 0 ?1B: Ask Month and Age - Could Not Answer Either Question Correctly + 2 ?1C: Blink Eyes & Squeeze Hands - Performs 0 Tasks + 2 ?2: Test Horizontal Extraocular Movements - Forced Gaze Palsy: Cannot Be Overcome + 2 ?3: Test Visual Fields - Complete Hemianopia + 2 ?4: Test Facial Palsy (Use Grimace if Obtunded) - Partial paralysis (lower face) + 2 ?5A: Test Left Arm Motor Drift - No Drift for 10 Seconds + 0 ?5B: Test Right Arm Motor Drift - No Effort Against Gravity + 3 ?6A: Test Left Leg Motor Drift - No Drift for 5 Seconds + 0 ?6B: Test Right Leg Motor Drift - No Effort Against Gravity + 3 ?7: Test Limb Ataxia (FNF/Heel-Shin) - No Ataxia + 0 ?8: Test Sensation - Normal; No sensory loss + 0 ?9: Test Language/Aphasia - Mute/Global Aphasia: No Usable Speech/Auditory Comprehension + 3 ?10: Test Dysarthria - Mild-Moderate Dysarthria: Slurring but can be understood + 1 ?11: Test Extinction/Inattention - No abnormality + 0 ? ?NIHSS Score: 20 ? ?NIHSS Free Text : He has left gaze deviation with right weakness. He is aphasic. He does flinch to IV being placed in the right arm. Blink to threat reduced on the right. ? ?Pre-Morbid Modified Rankin Scale: ?0 Points = No symptoms at all ? ? ? ?Consent could not be obtained due to patient status and family not available. ? ?Patient is being evaluated for possible acute neurologic impairment and high probability of imminent or life-threatening deterioration. I spent total of  40 minutes providing care to this patient, including time for face to face visit via telemedicine, review of medical records, imaging studies and discussion of findings with providers, the patient and/or family. ? ? ?Dr Albertha Ghee ? ? ?[facility] ?[phone] ? ? ?Case [id number] ?  ?

## 2021-05-19 NOTE — ED Notes (Addendum)
Pt friend, Marshell Garfinkel is at bedside to check on him, reports unable to get in touch or find pt daughter. ?

## 2021-05-20 ENCOUNTER — Emergency Department (HOSPITAL_COMMUNITY): Payer: Medicare HMO

## 2021-05-20 ENCOUNTER — Emergency Department (HOSPITAL_COMMUNITY): Payer: Medicare HMO | Admitting: Anesthesiology

## 2021-05-20 ENCOUNTER — Inpatient Hospital Stay (HOSPITAL_COMMUNITY): Payer: Medicare HMO

## 2021-05-20 ENCOUNTER — Encounter (HOSPITAL_COMMUNITY): Payer: Self-pay | Admitting: Neurology

## 2021-05-20 DIAGNOSIS — Z681 Body mass index (BMI) 19 or less, adult: Secondary | ICD-10-CM | POA: Diagnosis not present

## 2021-05-20 DIAGNOSIS — E43 Unspecified severe protein-calorie malnutrition: Secondary | ICD-10-CM | POA: Insufficient documentation

## 2021-05-20 DIAGNOSIS — I63512 Cerebral infarction due to unspecified occlusion or stenosis of left middle cerebral artery: Secondary | ICD-10-CM | POA: Diagnosis present

## 2021-05-20 DIAGNOSIS — Z7984 Long term (current) use of oral hypoglycemic drugs: Secondary | ICD-10-CM | POA: Diagnosis not present

## 2021-05-20 DIAGNOSIS — I63412 Cerebral infarction due to embolism of left middle cerebral artery: Secondary | ICD-10-CM | POA: Diagnosis present

## 2021-05-20 DIAGNOSIS — I517 Cardiomegaly: Secondary | ICD-10-CM | POA: Diagnosis not present

## 2021-05-20 DIAGNOSIS — S0081XA Abrasion of other part of head, initial encounter: Secondary | ICD-10-CM | POA: Diagnosis present

## 2021-05-20 DIAGNOSIS — I63312 Cerebral infarction due to thrombosis of left middle cerebral artery: Secondary | ICD-10-CM | POA: Diagnosis not present

## 2021-05-20 DIAGNOSIS — Y92007 Garden or yard of unspecified non-institutional (private) residence as the place of occurrence of the external cause: Secondary | ICD-10-CM | POA: Diagnosis not present

## 2021-05-20 DIAGNOSIS — I10 Essential (primary) hypertension: Secondary | ICD-10-CM

## 2021-05-20 DIAGNOSIS — W07XXXA Fall from chair, initial encounter: Secondary | ICD-10-CM | POA: Diagnosis present

## 2021-05-20 DIAGNOSIS — E871 Hypo-osmolality and hyponatremia: Secondary | ICD-10-CM | POA: Diagnosis present

## 2021-05-20 DIAGNOSIS — R29720 NIHSS score 20: Secondary | ICD-10-CM | POA: Diagnosis present

## 2021-05-20 DIAGNOSIS — Z833 Family history of diabetes mellitus: Secondary | ICD-10-CM | POA: Diagnosis not present

## 2021-05-20 DIAGNOSIS — I2510 Atherosclerotic heart disease of native coronary artery without angina pectoris: Secondary | ICD-10-CM

## 2021-05-20 DIAGNOSIS — G8191 Hemiplegia, unspecified affecting right dominant side: Secondary | ICD-10-CM | POA: Diagnosis not present

## 2021-05-20 DIAGNOSIS — I6602 Occlusion and stenosis of left middle cerebral artery: Secondary | ICD-10-CM | POA: Diagnosis present

## 2021-05-20 DIAGNOSIS — R4701 Aphasia: Secondary | ICD-10-CM | POA: Diagnosis not present

## 2021-05-20 DIAGNOSIS — F10129 Alcohol abuse with intoxication, unspecified: Secondary | ICD-10-CM | POA: Diagnosis not present

## 2021-05-20 DIAGNOSIS — M47816 Spondylosis without myelopathy or radiculopathy, lumbar region: Secondary | ICD-10-CM | POA: Diagnosis not present

## 2021-05-20 DIAGNOSIS — R29810 Facial weakness: Secondary | ICD-10-CM | POA: Diagnosis present

## 2021-05-20 DIAGNOSIS — Z4682 Encounter for fitting and adjustment of non-vascular catheter: Secondary | ICD-10-CM | POA: Diagnosis not present

## 2021-05-20 DIAGNOSIS — Z951 Presence of aortocoronary bypass graft: Secondary | ICD-10-CM | POA: Diagnosis not present

## 2021-05-20 DIAGNOSIS — E1165 Type 2 diabetes mellitus with hyperglycemia: Secondary | ICD-10-CM | POA: Diagnosis present

## 2021-05-20 DIAGNOSIS — Z7901 Long term (current) use of anticoagulants: Secondary | ICD-10-CM | POA: Diagnosis not present

## 2021-05-20 DIAGNOSIS — E119 Type 2 diabetes mellitus without complications: Secondary | ICD-10-CM | POA: Diagnosis not present

## 2021-05-20 DIAGNOSIS — Z21 Asymptomatic human immunodeficiency virus [HIV] infection status: Secondary | ICD-10-CM | POA: Diagnosis present

## 2021-05-20 DIAGNOSIS — E7800 Pure hypercholesterolemia, unspecified: Secondary | ICD-10-CM | POA: Diagnosis present

## 2021-05-20 DIAGNOSIS — I4819 Other persistent atrial fibrillation: Secondary | ICD-10-CM | POA: Diagnosis present

## 2021-05-20 DIAGNOSIS — F17210 Nicotine dependence, cigarettes, uncomplicated: Secondary | ICD-10-CM | POA: Diagnosis present

## 2021-05-20 DIAGNOSIS — Z79899 Other long term (current) drug therapy: Secondary | ICD-10-CM | POA: Diagnosis not present

## 2021-05-20 DIAGNOSIS — Z20822 Contact with and (suspected) exposure to covid-19: Secondary | ICD-10-CM | POA: Diagnosis present

## 2021-05-20 DIAGNOSIS — F1410 Cocaine abuse, uncomplicated: Secondary | ICD-10-CM | POA: Diagnosis present

## 2021-05-20 HISTORY — PX: IR PERCUTANEOUS ART THROMBECTOMY/INFUSION INTRACRANIAL INC DIAG ANGIO: IMG6087

## 2021-05-20 HISTORY — PX: IR CT HEAD LTD: IMG2386

## 2021-05-20 LAB — ECHOCARDIOGRAM COMPLETE
AR max vel: 2.11 cm2
AV Area VTI: 2.44 cm2
AV Area mean vel: 2.04 cm2
AV Mean grad: 2 mmHg
AV Peak grad: 3.2 mmHg
Ao pk vel: 0.9 m/s
Calc EF: 58.8 %
Height: 75 in
S' Lateral: 2.8 cm
Single Plane A2C EF: 49.7 %
Single Plane A4C EF: 65.6 %
Weight: 2546.75 oz

## 2021-05-20 LAB — BASIC METABOLIC PANEL
Anion gap: 6 (ref 5–15)
BUN: 9 mg/dL (ref 8–23)
CO2: 23 mmol/L (ref 22–32)
Calcium: 8.6 mg/dL — ABNORMAL LOW (ref 8.9–10.3)
Chloride: 106 mmol/L (ref 98–111)
Creatinine, Ser: 0.9 mg/dL (ref 0.61–1.24)
GFR, Estimated: >60 mL/min (ref >60)
Glucose, Bld: 183 mg/dL — ABNORMAL HIGH (ref 70–99)
Potassium: 3.4 mmol/L — ABNORMAL LOW (ref 3.5–5.1)
Sodium: 135 mmol/L (ref 135–145)

## 2021-05-20 LAB — CBC WITH DIFFERENTIAL/PLATELET
Abs Immature Granulocytes: 0 10*3/uL (ref 0.00–0.07)
Basophils Absolute: 0 10*3/uL (ref 0.0–0.1)
Basophils Relative: 0 %
Eosinophils Absolute: 0 10*3/uL (ref 0.0–0.5)
Eosinophils Relative: 2 %
HCT: 36.1 % — ABNORMAL LOW (ref 39.0–52.0)
Hemoglobin: 12.6 g/dL — ABNORMAL LOW (ref 13.0–17.0)
Immature Granulocytes: 0 %
Lymphocytes Relative: 24 %
Lymphs Abs: 0.7 10*3/uL (ref 0.7–4.0)
MCH: 31.3 pg (ref 26.0–34.0)
MCHC: 34.9 g/dL (ref 30.0–36.0)
MCV: 89.8 fL (ref 80.0–100.0)
Monocytes Absolute: 0.2 10*3/uL (ref 0.1–1.0)
Monocytes Relative: 8 %
Neutro Abs: 1.8 10*3/uL (ref 1.7–7.7)
Neutrophils Relative %: 66 %
Platelets: 126 10*3/uL — ABNORMAL LOW (ref 150–400)
RBC: 4.02 MIL/uL — ABNORMAL LOW (ref 4.22–5.81)
RDW: 13.2 % (ref 11.5–15.5)
WBC: 2.8 10*3/uL — ABNORMAL LOW (ref 4.0–10.5)
nRBC: 0 % (ref 0.0–0.2)

## 2021-05-20 LAB — HEMOGLOBIN A1C
Hgb A1c MFr Bld: 7.9 % — ABNORMAL HIGH (ref 4.8–5.6)
Mean Plasma Glucose: 180.03 mg/dL

## 2021-05-20 LAB — MAGNESIUM
Magnesium: 1.9 mg/dL (ref 1.7–2.4)
Magnesium: 2.5 mg/dL — ABNORMAL HIGH (ref 1.7–2.4)

## 2021-05-20 LAB — PHOSPHORUS
Phosphorus: 2.6 mg/dL (ref 2.5–4.6)
Phosphorus: 3.4 mg/dL (ref 2.5–4.6)

## 2021-05-20 LAB — LIPID PANEL
Cholesterol: 141 mg/dL (ref 0–200)
HDL: 69 mg/dL (ref >40)
LDL Cholesterol: 65 mg/dL (ref 0–99)
Total CHOL/HDL Ratio: 2 RATIO
Triglycerides: 37 mg/dL (ref <150)
VLDL: 7 mg/dL (ref 0–40)

## 2021-05-20 LAB — GLUCOSE, CAPILLARY
Glucose-Capillary: 101 mg/dL — ABNORMAL HIGH (ref 70–99)
Glucose-Capillary: 153 mg/dL — ABNORMAL HIGH (ref 70–99)
Glucose-Capillary: 185 mg/dL — ABNORMAL HIGH (ref 70–99)
Glucose-Capillary: 208 mg/dL — ABNORMAL HIGH (ref 70–99)
Glucose-Capillary: 244 mg/dL — ABNORMAL HIGH (ref 70–99)

## 2021-05-20 LAB — MRSA NEXT GEN BY PCR, NASAL: MRSA by PCR Next Gen: NOT DETECTED

## 2021-05-20 LAB — RESP PANEL BY RT-PCR (FLU A&B, COVID) ARPGX2
Influenza A by PCR: NEGATIVE
Influenza B by PCR: NEGATIVE
SARS Coronavirus 2 by RT PCR: NEGATIVE

## 2021-05-20 MED ORDER — SODIUM CHLORIDE 0.9 % IV SOLN: INTRAVENOUS | Status: DC

## 2021-05-20 MED ORDER — IOHEXOL 300 MG/ML  SOLN
100.0000 mL | Freq: Once | INTRAMUSCULAR | Status: AC | PRN
  Administered 2021-05-20: 55 mL via INTRA_ARTERIAL

## 2021-05-20 MED ORDER — TICAGRELOR 90 MG PO TABS
ORAL_TABLET | ORAL | Status: AC
  Filled 2021-05-20: qty 2

## 2021-05-20 MED ORDER — LISINOPRIL 10 MG PO TABS
10.0000 mg | ORAL_TABLET | Freq: Every day | ORAL | Status: DC
  Administered 2021-05-20 – 2021-05-23 (×4): 10 mg
  Filled 2021-05-20 (×5): qty 1

## 2021-05-20 MED ORDER — SUCCINYLCHOLINE CHLORIDE 200 MG/10ML IV SOSY
PREFILLED_SYRINGE | INTRAVENOUS | Status: DC | PRN
  Administered 2021-05-20: 120 mg via INTRAVENOUS

## 2021-05-20 MED ORDER — ASPIRIN 81 MG PO CHEW
81.0000 mg | CHEWABLE_TABLET | Freq: Every day | ORAL | Status: DC
  Administered 2021-05-20 – 2021-05-21 (×2): 81 mg
  Filled 2021-05-20 (×2): qty 1

## 2021-05-20 MED ORDER — LIDOCAINE HCL (CARDIAC) PF 100 MG/5ML IV SOSY
PREFILLED_SYRINGE | INTRAVENOUS | Status: DC | PRN
  Administered 2021-05-20: 60 mg via INTRATRACHEAL

## 2021-05-20 MED ORDER — CEFAZOLIN SODIUM-DEXTROSE 2-4 GM/100ML-% IV SOLN
INTRAVENOUS | Status: AC
  Filled 2021-05-20: qty 100

## 2021-05-20 MED ORDER — MAGNESIUM SULFATE 2 GM/50ML IV SOLN
2.0000 g | Freq: Once | INTRAVENOUS | Status: AC
  Administered 2021-05-20: 2 g via INTRAVENOUS
  Filled 2021-05-20: qty 50

## 2021-05-20 MED ORDER — LABETALOL HCL 5 MG/ML IV SOLN
10.0000 mg | INTRAVENOUS | Status: DC | PRN
  Administered 2021-05-20: 10 mg via INTRAVENOUS
  Filled 2021-05-20: qty 4

## 2021-05-20 MED ORDER — HYDRALAZINE HCL 50 MG PO TABS
50.0000 mg | ORAL_TABLET | Freq: Three times a day (TID) | ORAL | Status: DC
  Administered 2021-05-20 – 2021-05-24 (×10): 50 mg
  Filled 2021-05-20 (×11): qty 1

## 2021-05-20 MED ORDER — NITROGLYCERIN 1 MG/10 ML FOR IR/CATH LAB
INTRA_ARTERIAL | Status: AC
  Administered 2021-05-20: 100 ug via INTRA_ARTERIAL
  Filled 2021-05-20: qty 10

## 2021-05-20 MED ORDER — ADULT MULTIVITAMIN W/MINERALS CH
1.0000 | ORAL_TABLET | Freq: Every day | ORAL | Status: DC
  Administered 2021-05-20 – 2021-05-23 (×4): 1
  Filled 2021-05-20 (×5): qty 1

## 2021-05-20 MED ORDER — SENNOSIDES-DOCUSATE SODIUM 8.6-50 MG PO TABS: 1.0000 | ORAL_TABLET | Freq: Every evening | ORAL | Status: DC | PRN

## 2021-05-20 MED ORDER — FOLIC ACID 1 MG PO TABS
1.0000 mg | ORAL_TABLET | Freq: Every day | ORAL | Status: DC
  Administered 2021-05-20 – 2021-05-23 (×4): 1 mg
  Filled 2021-05-20 (×5): qty 1

## 2021-05-20 MED ORDER — EPHEDRINE SULFATE (PRESSORS) 50 MG/ML IJ SOLN
INTRAMUSCULAR | Status: DC | PRN
  Administered 2021-05-20: 5 mg via INTRAVENOUS

## 2021-05-20 MED ORDER — ACETAMINOPHEN 160 MG/5ML PO SOLN: 650.0000 mg | ORAL | Status: DC | PRN

## 2021-05-20 MED ORDER — ORAL CARE MOUTH RINSE
15.0000 mL | OROMUCOSAL | Status: DC
  Administered 2021-05-20 – 2021-05-22 (×21): 15 mL via OROMUCOSAL

## 2021-05-20 MED ORDER — CLEVIDIPINE BUTYRATE 0.5 MG/ML IV EMUL
INTRAVENOUS | Status: AC
Start: 2021-05-20 — End: 2021-05-20
  Administered 2021-05-20: 2 mg/h via INTRAVENOUS
  Filled 2021-05-20: qty 50

## 2021-05-20 MED ORDER — CLOPIDOGREL BISULFATE 300 MG PO TABS
ORAL_TABLET | ORAL | Status: DC
Start: 2021-05-20 — End: 2021-05-20
  Filled 2021-05-20: qty 1

## 2021-05-20 MED ORDER — VERAPAMIL HCL 2.5 MG/ML IV SOLN
INTRAVENOUS | Status: AC
  Filled 2021-05-20: qty 2

## 2021-05-20 MED ORDER — ACETAMINOPHEN 325 MG PO TABS: 650.0000 mg | ORAL_TABLET | ORAL | Status: DC | PRN

## 2021-05-20 MED ORDER — THIAMINE HCL 100 MG PO TABS
100.0000 mg | ORAL_TABLET | Freq: Every day | ORAL | Status: DC
  Administered 2021-05-20 – 2021-05-23 (×4): 100 mg
  Filled 2021-05-20 (×5): qty 1

## 2021-05-20 MED ORDER — CHLORHEXIDINE GLUCONATE 0.12% ORAL RINSE (MEDLINE KIT)
15.0000 mL | Freq: Two times a day (BID) | OROMUCOSAL | Status: DC
  Administered 2021-05-21 – 2021-05-22 (×3): 15 mL via OROMUCOSAL

## 2021-05-20 MED ORDER — ROCURONIUM BROMIDE 100 MG/10ML IV SOLN
INTRAVENOUS | Status: DC | PRN
  Administered 2021-05-20: 50 mg via INTRAVENOUS

## 2021-05-20 MED ORDER — HYDRALAZINE HCL 20 MG/ML IJ SOLN
10.0000 mg | INTRAMUSCULAR | Status: DC | PRN
  Administered 2021-05-20: 10 mg via INTRAVENOUS
  Filled 2021-05-20 (×2): qty 1

## 2021-05-20 MED ORDER — CANGRELOR TETRASODIUM 50 MG IV SOLR
INTRAVENOUS | Status: AC
  Filled 2021-05-20: qty 50

## 2021-05-20 MED ORDER — POTASSIUM CHLORIDE 10 MEQ/100ML IV SOLN
10.0000 meq | Freq: Once | INTRAVENOUS | Status: AC
Start: 2021-05-20 — End: 2021-05-20
  Administered 2021-05-20: 10 meq via INTRAVENOUS
  Filled 2021-05-20: qty 100

## 2021-05-20 MED ORDER — CLOPIDOGREL BISULFATE 75 MG PO TABS
75.0000 mg | ORAL_TABLET | Freq: Every day | ORAL | Status: DC
  Administered 2021-05-20 – 2021-05-21 (×2): 75 mg
  Filled 2021-05-20 (×2): qty 1

## 2021-05-20 MED ORDER — BICTEGRAVIR-EMTRICITAB-TENOFOV 50-200-25 MG PO TABS
1.0000 | ORAL_TABLET | Freq: Every day | ORAL | Status: DC
  Filled 2021-05-20: qty 1

## 2021-05-20 MED ORDER — PHENYLEPHRINE HCL-NACL 20-0.9 MG/250ML-% IV SOLN
INTRAVENOUS | Status: DC | PRN
  Administered 2021-05-20: 40 ug/min via INTRAVENOUS

## 2021-05-20 MED ORDER — IOHEXOL 350 MG/ML SOLN
60.0000 mL | Freq: Once | INTRAVENOUS | Status: AC | PRN
  Administered 2021-05-20: 60 mL via INTRAVENOUS

## 2021-05-20 MED ORDER — PHENYLEPHRINE HCL (PRESSORS) 10 MG/ML IV SOLN
INTRAVENOUS | Status: DC | PRN
  Administered 2021-05-20: 80 ug via INTRAVENOUS

## 2021-05-20 MED ORDER — CHLORHEXIDINE GLUCONATE CLOTH 2 % EX PADS
6.0000 | MEDICATED_PAD | Freq: Every day | CUTANEOUS | Status: DC
  Administered 2021-05-21 – 2021-05-22 (×3): 6 via TOPICAL

## 2021-05-20 MED ORDER — PRAVASTATIN SODIUM 40 MG PO TABS
20.0000 mg | ORAL_TABLET | Freq: Every day | ORAL | Status: DC
  Administered 2021-05-20 – 2021-05-23 (×4): 20 mg
  Filled 2021-05-20: qty 2
  Filled 2021-05-20 (×2): qty 1
  Filled 2021-05-20: qty 2

## 2021-05-20 MED ORDER — VITAL AF 1.2 CAL PO LIQD
1000.0000 mL | ORAL | Status: DC
  Administered 2021-05-20 – 2021-05-24 (×5): 1000 mL
  Filled 2021-05-20 (×6): qty 1000

## 2021-05-20 MED ORDER — EPTIFIBATIDE 20 MG/10ML IV SOLN
INTRAVENOUS | Status: AC
  Filled 2021-05-20: qty 10

## 2021-05-20 MED ORDER — INSULIN ASPART 100 UNIT/ML IJ SOLN
0.0000 [IU] | INTRAMUSCULAR | Status: DC
  Administered 2021-05-20: 2 [IU] via SUBCUTANEOUS
  Administered 2021-05-20 (×2): 3 [IU] via SUBCUTANEOUS
  Administered 2021-05-20: 1 [IU] via SUBCUTANEOUS
  Administered 2021-05-20: 2 [IU] via SUBCUTANEOUS
  Administered 2021-05-21: 3 [IU] via SUBCUTANEOUS
  Administered 2021-05-21 (×4): 2 [IU] via SUBCUTANEOUS
  Administered 2021-05-22: 1 [IU] via SUBCUTANEOUS
  Administered 2021-05-22: 2 [IU] via SUBCUTANEOUS
  Administered 2021-05-22: 3 [IU] via SUBCUTANEOUS
  Administered 2021-05-22: 2 [IU] via SUBCUTANEOUS
  Administered 2021-05-22 (×2): 3 [IU] via SUBCUTANEOUS
  Administered 2021-05-22: 5 [IU] via SUBCUTANEOUS
  Administered 2021-05-23 (×4): 2 [IU] via SUBCUTANEOUS
  Administered 2021-05-23 – 2021-05-24 (×2): 1 [IU] via SUBCUTANEOUS
  Administered 2021-05-24: 5 [IU] via SUBCUTANEOUS
  Administered 2021-05-24: 2 [IU] via SUBCUTANEOUS

## 2021-05-20 MED ORDER — POTASSIUM CHLORIDE 10 MEQ/100ML IV SOLN
10.0000 meq | INTRAVENOUS | Status: AC
  Administered 2021-05-20 (×3): 10 meq via INTRAVENOUS
  Filled 2021-05-20 (×3): qty 100

## 2021-05-20 MED ORDER — LACTATED RINGERS IV SOLN: INTRAVENOUS | Status: DC | PRN

## 2021-05-20 MED ORDER — PROPOFOL 10 MG/ML IV BOLUS
INTRAVENOUS | Status: DC | PRN
  Administered 2021-05-20: 80 mg via INTRAVENOUS

## 2021-05-20 MED ORDER — CLEVIDIPINE BUTYRATE 0.5 MG/ML IV EMUL
0.0000 mg/h | INTRAVENOUS | Status: AC
  Administered 2021-05-20: 10 mg/h via INTRAVENOUS
  Administered 2021-05-20: 18 mg/h via INTRAVENOUS
  Administered 2021-05-20: 14 mg/h via INTRAVENOUS
  Administered 2021-05-20 (×2): 18 mg/h via INTRAVENOUS
  Administered 2021-05-20: 12 mg/h via INTRAVENOUS
  Administered 2021-05-20: 18 mg/h via INTRAVENOUS
  Administered 2021-05-20: 20 mg/h via INTRAVENOUS
  Administered 2021-05-21: 16 mg/h via INTRAVENOUS
  Administered 2021-05-21: 14 mg/h via INTRAVENOUS
  Filled 2021-05-20 (×4): qty 50
  Filled 2021-05-20: qty 100
  Filled 2021-05-20 (×6): qty 50

## 2021-05-20 MED ORDER — ACETAMINOPHEN 650 MG RE SUPP: 650.0000 mg | RECTAL | Status: DC | PRN

## 2021-05-20 MED ORDER — FENTANYL CITRATE (PF) 250 MCG/5ML IJ SOLN
INTRAMUSCULAR | Status: DC | PRN
  Administered 2021-05-20 (×2): 50 ug via INTRAVENOUS

## 2021-05-20 MED ORDER — STROKE: EARLY STAGES OF RECOVERY BOOK: Freq: Once | Status: DC

## 2021-05-20 MED ORDER — PROPOFOL 1000 MG/100ML IV EMUL
5.0000 ug/kg/min | INTRAVENOUS | Status: DC
  Administered 2021-05-20: 45 ug/kg/min via INTRAVENOUS
  Administered 2021-05-20: 35 ug/kg/min via INTRAVENOUS
  Filled 2021-05-20 (×2): qty 100

## 2021-05-20 MED ORDER — ASPIRIN 81 MG PO CHEW
CHEWABLE_TABLET | ORAL | Status: AC
  Filled 2021-05-20: qty 1

## 2021-05-20 MED ORDER — AMLODIPINE BESYLATE 10 MG PO TABS
10.0000 mg | ORAL_TABLET | Freq: Every day | ORAL | Status: DC
Start: 2021-05-20 — End: 2021-05-24
  Administered 2021-05-20 – 2021-05-23 (×4): 10 mg
  Filled 2021-05-20 (×5): qty 1

## 2021-05-20 MED ORDER — CEFAZOLIN SODIUM-DEXTROSE 2-3 GM-%(50ML) IV SOLR
INTRAVENOUS | Status: DC | PRN
  Administered 2021-05-20: 2 g via INTRAVENOUS

## 2021-05-20 MED ORDER — PANTOPRAZOLE 2 MG/ML SUSPENSION
40.0000 mg | Freq: Every day | ORAL | Status: DC
  Administered 2021-05-20 – 2021-05-23 (×4): 40 mg
  Filled 2021-05-20 (×4): qty 20

## 2021-05-20 MED ORDER — DILTIAZEM HCL 30 MG PO TABS
30.0000 mg | ORAL_TABLET | Freq: Three times a day (TID) | ORAL | Status: DC
  Administered 2021-05-20 – 2021-05-24 (×11): 30 mg
  Filled 2021-05-20 (×11): qty 1

## 2021-05-20 NOTE — Progress Notes (Signed)
PT Cancellation Note ? ?Patient Details ?Name: Anthony Ferguson ?MRN: [id number] ?DOB: December 13, 1950 ? ? ?Cancelled Treatment:    Reason Eval/Treat Not Completed: Medical issues which prohibited therapy - plan for SBT later this am, remains intubated at this time. PT to check back later pending extubation.  ? ?Stacie Glaze, PT DPT ?Acute Rehabilitation Services ?Pager [phone]  ?Office [phone] ? ? ? ?[name] ?05/20/2021, 9:27 AM ?

## 2021-05-20 NOTE — Progress Notes (Signed)
Code stroke activated at 2202 ?

## 2021-05-20 NOTE — Progress Notes (Signed)
Patient presented to [facility] as a Code Stroke due to an occluded left middle cerebral artery. He underwent cerebral arteriogram with thrombectomy this morning; TICI 3 revascularization achieved.  ? ?Patient remains intubated/sedated. Post-procedure MR Brain pending. Aspirin and Brilinta scheduled to be given this afternoon. Per RN, right groin vascular site is clean, dry, intact.  ? ?IR will continue to follow.  ? Soyla Dryer, AGACNP-BC ?[phone] ?05/20/2021, 1:21 PM ? ? ?

## 2021-05-20 NOTE — Progress Notes (Signed)
?  Transition of Care (TOC) Screening Note ? ? ?Patient Details  ?Name: Anthony Ferguson ?Date of Birth: [birth year] ? ? ?Transition of Care [facility]) CM/SW Contact:    ?Glennon Mac, RN ?Phone Number: ?05/20/2021, 12:36 PM ? ? ? ?Transition of Care Department [facility]) has reviewed patient and no TOC needs have been identified at this time. We will continue to monitor patient advancement through interdisciplinary progression rounds. If new patient transition needs arise, please place a TOC consult. ? ?Quintella Baton, RN, BSN  ?Trauma/Neuro ICU Case Manager ?[phone] ? ?

## 2021-05-20 NOTE — H&P (Signed)
NEUROLOGY CONSULTATION NOTE  ? ?Date of service: May 20, 2021 ?Patient Name: Anthony Ferguson ?MRN:  [id number] ?DOB:  December 13, 1950 ?_ _ _   _ __   _ __ _ _  __ __   _ __   __ _ ? ?History of Present Illness  ?Anthony Ferguson is a 71 y.o. Ferguson with PMH significant for HIV, HTN, hypercholesterolemia diabetes type 2, who presented to [facility] after he was drinking outside, slipped out of his chair and had a superficial bruise. He had a R facial droop but was otherwise completely intact on arrival. In the ED, around 2200, he was noted to be repeating himself, not moving R side and a code stroke was activated. He was evaluated by Teleneurology and was not offered tNKASE due to unclear LKW. CTH with hyperdense L MCA. CTA with L MCA M1 occlusion. Case discussed with Neurology and Neuro IR and felt to be a candidate for thrombectomy and was transferred to [facility]. ? ?LKW: unclear, friend who arrived later reports that he was seen to be completely at his baseline around 1700 on 05/19/21. ?mRS: 0(lives by himself) ?tNKASE: not offered due to unclear LKW when he initially presented and is on Eliquis and unclear compliance. ?Thrombectomy: Case was discussed between me, teleneurologist Dr. Rada Hay and Dr. Estanislado Pandy with Neuro IR. Unable to identify next of kin, listed phone number for daughter is a disconnected number. All 3 of [place] felt that he was an appropriate candidate for thrombectomy and thus was transferred to [facility] for thrombectomy. STAT CT Perfusion was obtained to evaluate size of penumbra and core prior to pursuing thrombectomy. ?NIHSS components Score: Comment  ?1a Level of Conscious 0[x]  1[]  2[]  3[]      ?1b LOC Questions 0[]  1[]  2[x]       ?1c LOC Commands 0[]  1[]  2[x]       ?2 Best Gaze 0[]  1[]  2[x]       ?3 Visual 0[]  1[]  2[x]  3[]      ?4 Facial Palsy 0[]  1[]  2[x]  3[]      ?5a Motor Arm - left 0[x]  1[]  2[]  3[]  4[]  UN[]    ?5b Motor Arm - Right 0[]  1[]  2[]  3[]  4[x]  UN[]    ?6a Motor Leg - Left 0[x]  1[]   2[]  3[]  4[]  UN[]    ?6b Motor Leg - Right 0[]  1[]  2[]  3[x]  4[]  UN[]    ?7 Limb Ataxia 0[x]  1[]  2[]  3[]  UN[]     ?8 Sensory 0[]  1[]  2[x]  UN[]      ?9 Best Language 0[]  1[]  2[]  3[x]      ?10 Dysarthria 0[]  1[x]  2[]  UN[]      ?11 Extinct. and Inattention 0[]  1[x]  2[]       ?TOTAL: 24   ? ? ?ROS  ? ?Unable to obtain ROS due to aphasia. ? ?Past History  ? ?Past Medical History:  ?Diagnosis Date  ?? HIV (human immunodeficiency virus infection) ([place])   ?? Hypercholesteremia   ?? Hypertension   ?? Type 2 diabetes mellitus ([place])   ? ?Past Surgical History:  ?Procedure Laterality Date  ?? CARDIAC SURGERY    ?? CIRCUMCISION    ?? CORONARY ARTERY BYPASS GRAFT    ? ?Family History  ?Problem Relation Age of Onset  ?? Diabetes Mother   ?? Heart failure Mother   ? ?Social History  ? ?Socioeconomic History  ?? Marital status: Legally Separated  ?  Spouse name: Not on file  ?? Number of children: Not on file  ?? Years of education: Not on  file  ?? Highest education level: Not on file  ?Occupational History  ?? Not on file  ?Tobacco Use  ?? Smoking status: Every Day  ?  Packs/day: 0.30  ?  Types: Cigarettes  ?? Smokeless tobacco: Never  ?? Tobacco comments:  ?  Trying to slow down  ?Vaping Use  ?? Vaping Use: Never used  ?Substance and Sexual Activity  ?? Alcohol use: Yes  ?  Alcohol/week: 5.0 standard drinks  ?  Types: 5 Cans of beer per week  ?  Comment: occas  ?? Drug use: Not Currently  ?  Types: Cocaine  ?  Comment: last use 07/22/16  ?? Sexual activity: Not Currently  ?  Partners: Female  ?  Comment: condoms declined  ?Other Topics Concern  ?? Not on file  ?Social History Narrative  ?? Not on file  ? ?Social Determinants of Health  ? ?Financial Resource Strain: Not on file  ?Food Insecurity: Not on file  ?Transportation Needs: Not on file  ?Physical Activity: Not on file  ?Stress: Not on file  ?Social Connections: Not on file  ? ?Allergies  ?Allergen Reactions  ?? Hctz [Hydrochlorothiazide] Other (See Comments)  ?  "ran my sugar up"  ??  Pork-Derived Products Palpitations and Hypertension  ? ? ?Medications  ?(Not in a hospital admission) ?  ? ?Vitals  ? ?Vitals:  ? 05/19/21 2323 05/19/21 2330 05/19/21 2335 05/19/21 2340  ?BP: (!) 171/101 (!) 190/109 (!) 174/97 (!) 171/92  ?Pulse: (!) 55 68 69 (!) 50  ?Resp: 15 14 20 17   ?Temp: 98.1 ?F (36.7 ?C)     ?TempSrc: Oral     ?SpO2: 100% 100% 99% 100%  ?Weight:      ?Height:      ?  ? ?Body mass index is 21 kg/m?. ? ?Physical Exam  ? ?General: Laying comfortably in bed; in no acute distress.  ?HENT: Normal oropharynx and mucosa. Normal external appearance of ears and nose.  ?Neck: Supple, no pain or tenderness  ?CV: No JVD. No peripheral edema.  ?Pulmonary: Symmetric Chest rise. Normal respiratory effort.  ?Abdomen: Soft to touch, non-tender.  ?Ext: No cyanosis, edema, or deformity  ?Skin: No rash. Normal palpation of skin.   ?Musculoskeletal: Normal digits and nails by inspection. No clubbing.  ? ?Neurologic Examination  ?Mental status/Cognition: Alert, makes eye contact on the left, does not answer any questions, does not follow commands. ?Speech/language: Global aphasia, no speech. ?Cranial nerves:  ? CN II Pupils equal and reactive to light, R hemianopsia.  ? CN III,IV,VI L gaze deviation.  ? CN V Corneals intact BL  ? CN VII R facial droop  ? CN VIII Turns head towards speech  ? CN IX & X Unable to assess.  ? CN XI Unable to assess.  ? CN XII midline tongue but does not protrude on command.  ? ?Motor:  ?Muscle bulk: poor, tone flaccid in RUE. ?Mvmt Root Nerve  Muscle Right Left Comments  ?SA C5/6 Ax Deltoid 0 4+   ?EF C5/6 Mc Biceps     ?EE C6/7/8 Rad Triceps     ?WF C6/7 Med FCR     ?WE C7/8 PIN ECU     ?F Ab C8/T1 U ADM/FDI 0 5   ?HF L1/2/3 Fem Illopsoas 3 4+   ?KE L2/3/4 Fem Quad     ?DF L4/5 D Peron Tib Ant  5   ?PF S1/2 Tibial Grc/Sol 1 5   ? ?Reflexes: ? Right Left Comments  ?  Pectoralis     ? Biceps (C5/6) 2 2   ?Brachioradialis (C5/6) 2 2   ? Triceps (C6/7) 2 2   ? Patellar (L3/4) 2 2   ?  Achilles (S1)     ? Hoffman     ? Plantar     ?Jaw jerk   ? ?Sensation: ? Light touch No response to pinch in RUE and RLE.  ? Pin prick   ? Temperature   ? Vibration   ?Proprioception   ? ?Coordination/Complex Motor:  ?- Finger to Nose unable to assess given aphasia. ?- Heel to shin unable to assess. ?- Rapid alternating movement unable to assess. ?- Gait: deferred for patient safety. ? ?Labs  ? ?CBC:  ?Recent Labs  ?Lab 05/19/21 ?2208  ?WBC 3.3*  ?NEUTROABS 2.2  ?HGB 13.6  ?HCT 41.0  ?MCV 91.7  ?PLT 134*  ? ? ?Basic Metabolic Panel:  ?Lab Results  ?Component Value Date  ? NA 137 05/19/2021  ? K 3.9 05/19/2021  ? CO2 24 05/19/2021  ? GLUCOSE 195 (H) 05/19/2021  ? BUN 13 05/19/2021  ? CREATININE 1.00 05/19/2021  ? CALCIUM 9.2 05/19/2021  ? GFRNONAA >60 05/19/2021  ? GFRAA >89 10/12/2016  ? ?Lipid Panel:  ?Lab Results  ?Component Value Date  ? [place] 77 12/17/2019  ? ?HgbA1c:  ?Lab Results  ?Component Value Date  ? HGBA1C 6.2 (H) 04/18/2011  ? ?Urine Drug Screen:  ?   ?Component Value Date/Time  ? [place] DETECTED 05/19/2021 2217  ? COCAINSCRNUR POSITIVE (A) 05/19/2021 2217  ? [place] DETECTED 05/19/2021 2217  ? AMPHETMU NONE DETECTED 05/19/2021 2217  ? [place] DETECTED 05/19/2021 2217  ? [place] DETECTED 05/19/2021 2217  ?  ?Alcohol Level  ?   ?Component Value Date/Time  ? ETH <10 05/19/2021 2208  ? ? ?CT Head without contrast(Personally reviewed): ?Hyperdense L MCA M1. ASPECTS of 10. ? ?CT angio Head and Neck with contrast(Personally reviewed): ?L MCA M1 occlusion ? ?CT perfusion(Personally reviewed): ?150cc penumbra in the L MCA territory with no core. ? ?MRI Brain: ?Pending ? ?Impression  ? ?PELHAM SAILE is a 71 y.o. Ferguson with PMH significant for HIV, HTN, hypercholesterolemia diabetes type 2, who presented to [facility] initially with R facial droop and had worsening with aphasia + R sided weakness and found to have an acute L MCA stroke with L MCA M1 occlusion, ASPECTS of 10 with  large penumbra on CT perfusion. No tNKASE given unclear LKW  and unclear compliance with Eliquis. He was transferred to [facility] for thrombectomy. ? ?Primary Diagnosis:  ?Cerebral infarction due to embolism

## 2021-05-20 NOTE — Progress Notes (Signed)
Clarified with Dr. Corliss Skains that arterial access labeled as "sheath" in LDA is not an arterial sheath and is an a-line.  ?

## 2021-05-20 NOTE — Consult Note (Signed)
? ?NAME:  Anthony Ferguson, MRN:  [id number], DOB:  Dec 28, 1950, LOS: 0 ?ADMISSION DATE:  05/19/2021 CONSULTATION DATE:  05/20/2021 ?REFERRING MD:  Estanislado Pandy - NIR CHIEF COMPLAINT:  Stroke, L MCA M1 occlusion  ? ?History of Present Illness:  ?71 year old man who presented to [facility] ED 3/30 after a fall from his chair at home due to suspected intoxication. Patient did hit his head, unknown LOC. While in the ED, patient developed acute R-sided weakness, R facial droop and left gaze deviation prompting Code Stroke. LKW unknown, therefore no TNK given. PMHx significant for HTN, HLD, T2DM, Afib (not on AC), EtOH abuse, HIV (CD4 320 2021)). ?  ?Per chart review patient developed acute R-sided weakness left gaze deviation in ED.  CT Head did not demonstrate ICH but did show hyperdense L MCA.  TNK was not administered, as risk felt to outweigh benefit.  CTA head and neck demonstrated left M1 cutoff. Decision was made to transfer patient to [facility] for IR thrombectomy. ? ?Patient was taken for L common carotid arteriogram via R common femoral approach.  Findings included occluded left MCA M1 segment.  Underwent IR mechanical thrombectomy (Dr. Estanislado Pandy) with complete revascularization of the L MCA M1 occlusion with 1 pass (TICI 3 revascularization). ?  ?Patient remained intubated postprocedure and PCCM was consulted for ventilator management ? ?Pertinent Medical History:  ? ?Past Medical History:  ?Diagnosis Date  ? HIV (human immunodeficiency virus infection) ([place])   ? Hypercholesteremia   ? Hypertension   ? Type 2 diabetes mellitus ([place])   ? ?Significant Hospital Events: ?Including procedures, antibiotic start and stop dates in addition to other pertinent events   ?3/30 - Presented to [facility] ED after fall from chair at home. R sided weakness and L gaze deviation. Code Stroke called. CT Head with hyperdense L MCA. CTA Head/Neck with L MCA M1 occlusion. Underwent mechanical thrombectomy with TICI 3 revascularization. ? ?Interim History /  Subjective:  ?PCCM consulted for post-procedure vent and BP management ? ?Objective:  ?Blood pressure (!) 170/107, pulse 66, temperature (!) 94.1 ?F (34.5 ?C), temperature source Axillary, resp. rate 18, height 6\' 3"  (1.905 m), weight 72.2 kg, SpO2 100 %. ?   ?Vent Mode: PRVC ?FiO2 (%):  [60 %] 60 % ?Set Rate:  [18 bmp] 18 bmp ?Vt Set:  [id number] mL] 670 mL ?PEEP:  [5 cmH20] 5 cmH20 ?Plateau Pressure:  [18 cmH20] 18 cmH20  ? ?Intake/Output Summary (Last 24 hours) at 05/20/2021 0249 ?Last data filed at 05/20/2021 0151 ?Gross per 24 hour  ?Intake 650 ml  ?Output 100 ml  ?Net 550 ml  ? ?Filed Weights  ? 05/19/21 1958 05/20/21 0217  ?Weight: 76.2 kg 72.2 kg  ? ?Physical Examination: ?General: Chronically ill-appearing elderly man in NAD. ?HEENT: [place]/AT, anicteric sclera, PERRL, dry mucous membranes. ?Neuro: Sedated. Withdraws to pain in left upper/left lower extremities. Not following commands. Unilateral R-sided neglect noted.+Cough and +Gag  ?CV: RRR, no m/g/r. ?PULM: Breathing even and unlabored on vent (PEEP 4, FiO2 40%). Lung fields CTAB.Marland Kitchen ?GI: Soft, nontender, nondistended. Normoactive bowel sounds. ?Extremities: No LE edema noted. ?Skin: Warm/dry, multiple small erythematous cuts and scrapes.. ? ?Resolved Hospital Problem List:  ? ? 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  ?Acute hypoxemic respiratory failure 2/2 procedure ?- Continue full vent support (4-8cc/kg IBW) ?- Wean FiO2 for O2 sat > 90% ?- Daily WUA/SBT ?- VAP bundle ?- Bronchodilators PRN ?- Pulmonary hygiene ?- PAD protocol for sedation: Propofol for goal RASS 0 to -1 ?  ?Atrial fibrillation ?CHA2DS2-VASc score 3 ( not on [facility] ?- Cardiac  monitoring ?- F/u Echo ?- Discuss AC initiation based on risk factors ? ?Type 2 DM ?- CBGs Q4H ?- SSI PRN ? ?HIV ?Well controlled, annual HIV DNA or CD4 ?- Continue HAART ?- Trend CD4 as approproate ? ?Best Practice: (right click and "Reselect all SmartList Selections" daily)  ? ?Per Primary Team ? ?Labs:  ?CBC: ?Recent Labs  ?Lab 05/19/21 ?2208 05/20/21 ?0228  ?WBC 3.3* 2.8*  ?NEUTROABS 2.2 1.8  ?HGB 13.6 12.6*  ?HCT 41.0 36.1*  ?MCV 91.7 89.8  ?PLT 134* 126*  ? ?Basic Metabolic Panel: ?Recent Labs  ?Lab 05/19/21 ?2208  ?NA 137  ?K 3.9  ?CL 107  ?CO2 24  ?GLUCOSE 195*  ?BUN 13  ?CREATININE 1.00  ?CALCIUM 9.2  ? ?GFR: ?Estimated Creatinine Clearance: 68.2 mL/min (by C-G formula based on SCr of 1 mg/dL). ?Recent Labs  ?Lab 05/19/21 ?2208 05/20/21 ?0228  ?WBC 3.3* 2.8*  ? ?Liver Function Tests: ?No results for input(s): AST, ALT, ALKPHOS, BILITOT, PROT, ALBUMIN in the last 168 hours. ?No results for input(s): LIPASE, AMYLASE in the last 168 hours. ?No results for input(s): AMMONIA in the last 168 hours. ? ?ABG: ?No results found for: PHART, PCO2ART, PO2ART, HCO3, TCO2, ACIDBASEDEF, O2SAT  ? ?Coagulation Profile: ?Recent Labs  ?Lab 05/19/21 ?2208  ?INR 1.1  ? ?Cardiac Enzymes: ?No results for input(s): CKTOTAL, CKMB, CKMBINDEX, TROPONINI in the last 168 hours. ? ?HbA1C: ?Hgb A1c MFr Bld  ?Date/Time Value Ref Range Status  ?05/20/2021 02:28 AM 7.9 (H) 4.8 - 5.6 % Final  ?  Comment:  ?  (NOTE) ?Pre diabetes:          5.7%-6.4% ? ?Diabetes:              >6.4% ? ?Glycemic control for   <7.0% ?adults with diabetes ?  ?04/18/2011 03:30 PM 6.2 (H) <5.7 % Final  ?  Comment:  ?                                                                         ?According to the ADA Clinical Practice Recommendations for 2011, when ?HbA1c is used as a screening test: ?  ?  >=6.5%   Diagnostic of Diabetes Mellitus ?           (if abnormal result is confirmed) ?  ?5.7-6.4%   Increased risk of developing Diabetes Mellitus ?   ?References:Diagnosis and Classification of Diabetes Mellitus,Diabetes ?[id number] 1):S62-S69 and Standards of Medical Care in         ?Diabetes - 2011,Diabetes Care,2011,34 (Suppl 1):S11-S61.  ? ?CBG: ?Recent Labs  ?Lab 05/19/21 ?2225  ?GLUCAP 149*  ? ?Review of Systems:   ?Patient is encephalopathic and/or intubated. Therefore history has been obtained from chart review.  ?Past Medical History:  ?He,  has a past medical history of HIV (human immunodeficiency  virus infection) ([place]), Hypercholesteremia, Hypertension, and Type 2 diabetes mellitus ([place]).  ? ?Surgical History:  ? ?Past Surgical History:  ?Procedure Laterality Date  ? CARDIAC SURGERY    ? CIRCUMCISION    ? CORONARY ARTERY BYPASS GRAFT    ?  ?Social History:  ? reports that he has been smoking cigarettes. He has been smoking an average of .3 packs per day. He has never used smokeless tobacco. He reports current alcohol use of about 5.0 standard drinks per week. He reports that he does not currently use drugs after having used the following drugs: Cocaine.  ? ?Family History:  ?His family history includes Diabetes in his mother; Heart failure in his mother.  ? ?Allergies: ?Allergies  ?Allergen Reactions  ? Hctz [Hydrochlorothiazide] Other (See Comments)  ?  "ran my sugar up"  ? Pork-Derived Products Palpitations and Hypertension  ? ?Home Medications: ?Prior to Admission medications   ?Medication Sig Start Date End Date Taking? Authorizing Provider  ?apixaban (ELIQUIS) 5 MG TABS tablet Take 1 tablet (5 mg total) by mouth 2 (two) times daily. ?Patient not taking: Reported on 12/17/2019 07/24/16   Laqueta Linden, MD  ?Phillips Odor HE:[id number] MG TABS tablet TAKE 1 TABLET EVERY DAY. 02/28/21   Michel Bickers, MD  ?cephALEXin (KEFLEX) 500 MG capsule Take 1 capsule (500 mg total) by mouth 4 (four) times daily. ?Patient not taking: Reported on 12/17/2019 09/15/19   Recardo Evangelist, PA-C  ?cyclobenzaprine (FLEXERIL) 10 MG tablet Take 1 tablet (10 mg total)  by mouth 3 (three) times daily. ?Patient not taking: Reported on 12/17/2019 09/04/17   Lily Kocher, PA-C  ?diltiazem (CARDIZEM) 30 MG tablet Take 1 tablet (30 mg total) by mouth 3 (three) times daily. 07/24/16   Lawerance Cruel

## 2021-05-20 NOTE — Progress Notes (Signed)
? ?  Echocardiogram ?2D Echocardiogram has been performed. ? ?Anthony Ferguson ?05/20/2021, 10:41 AM ?

## 2021-05-20 NOTE — Procedures (Signed)
Extubation Procedure Note ? ?Patient Details:   ?Name: Anthony Ferguson ?DOB: July 21, 1950 ?MRN: [id number] ?  ?Airway Documentation:  ?  ?Vent end date: (not recorded) Vent end time: (not recorded)  ? ?Evaluation ? O2 sats: stable throughout ?Complications: No apparent complications ?Patient did tolerate procedure well. ?Bilateral Breath Sounds: Rhonchi ?  ?Yes, patient able to speak. ? ?Patient extubated per MD order with RN at bedside. Patient placed on 4L nasal cannula. Patient tolerated well. All vitals stable. ? ?Farris Has ?05/20/2021, 3:40 PM ? ?

## 2021-05-20 NOTE — Progress Notes (Signed)
SLP Cancellation Note ? ?Patient Details ?Name: Anthony Ferguson ?MRN: [id number] ?DOB: 02-Jan-1951 ? ? ?Cancelled treatment:   SLP order received. Pt is currently still on the vent. Will follow up for evaluation post-extubation.  ?    ? ?Saydee Zolman L. Eathel Pajak, MA CCC/SLP ?Acute Rehabilitation Services ?Office number [phone] ?Pager [phone] ? ?[name] ?05/20/2021, 8:40 AM ?

## 2021-05-20 NOTE — Anesthesia Procedure Notes (Signed)
Procedure Name: Intubation ?Date/Time: 05/20/2021 12:47 AM ?Performed by: Claudina Lick, CRNA ?Pre-anesthesia Checklist: Patient identified, Emergency Drugs available, Suction available and Patient being monitored ?Patient Re-evaluated:Patient Re-evaluated prior to induction ?Oxygen Delivery Method: Circle system utilized ?Preoxygenation: Pre-oxygenation with 100% oxygen ?Induction Type: IV induction, Rapid sequence and Cricoid Pressure applied ?Laryngoscope Size: Hyacinth Meeker and 2 ?Grade View: Grade I ?Tube type: Oral ?Tube size: 8.0 mm ?Number of attempts: 1 ?Airway Equipment and Method: Stylet ?Placement Confirmation: ETT inserted through vocal cords under direct vision, positive ETCO2 and breath sounds checked- equal and bilateral ?Secured at: 22 cm ?Tube secured with: Tape ?Dental Injury: Teeth and Oropharynx as per pre-operative assessment  ? ? ? ? ?

## 2021-05-20 NOTE — Anesthesia Postprocedure Evaluation (Signed)
Anesthesia Post Note ? ?Patient: Anthony Ferguson ? ?Procedure(s) Performed: IR WITH ANESTHESIA ? ?  ? ?Patient location during evaluation: PACU ?Anesthesia Type: General ?Level of consciousness: sedated and patient cooperative ?Pain management: pain level controlled ?Vital Signs Assessment: post-procedure vital signs reviewed and stable ?Respiratory status: spontaneous breathing ?Cardiovascular status: stable ?Anesthetic complications: no ? ? ?No notable events documented. ? ?Last Vitals:  ?Vitals:  ? 05/20/21 0600 05/20/21 0700  ?BP: (!) 160/111 135/83  ?Pulse: 65 (!) 101  ?Resp: 18 18  ?Temp:    ?SpO2: 100% 100%  ?  ?Last Pain:  ?Vitals:  ? 05/20/21 0400  ?TempSrc: Oral  ?PainSc:   ? ? ?  ?  ?  ?  ?  ?  ? ?[name] ? ? ? ? ?

## 2021-05-20 NOTE — Progress Notes (Signed)
Clarified with Dr. Estanislado Pandy at 1000 that this line is an a-line, not an arterial sheath ?

## 2021-05-20 NOTE — Progress Notes (Addendum)
Initial Nutrition Assessment ? ?DOCUMENTATION CODES:  ?Severe malnutrition in context of social or environmental circumstances ? ?INTERVENTION:  ?Once cortrak in place, recommend the following. Kcal from propofol included. ?Initiate tube feeding via cortrak: ?Vital AF 1.2 at 65 ml/h (1560 ml per day). Initiate at 53mL/h and advance by 43mL q12 hours to goal of 62mL/h. ?Standard free water flush of 18mL q4h ?Provides 1872 kcal, 117 gm protein, 1446 ml free water daily (TF+flush) ?TF + propofol = 2387kcal/d ?Pt is a high refeeding risk, recommend monitoring for electrolyte abnormalities for at least 48 hours. Continue to replace and monitor until levels are sustained WNL. ?1mg  folic acid, 100mg  thiamine, and MVI with minerals daily via tube ?In the absence of propofol recommend the following TF regimen: ?Osmolite 1.5 at 65 ml/h (1560 ml per day). ?Prosource TF BID (40kcal and 11g of protein per packet) ?Standard free water flush of 31mL q4h ?Provides 2420 kcal, 120 gm protein, 1369 ml free water daily (TF+flush) ? ?NUTRITION DIAGNOSIS:  ?Severe Malnutrition related to social / environmental circumstances (drug abuse, etoh abuse) as evidenced by severe fat depletion, severe muscle depletion. ? ?GOAL:  ?Patient will meet greater than or equal to 90% of their needs ? ?MONITOR:  ?Vent status, TF tolerance, I & O's, Labs ? ?REASON FOR ASSESSMENT:  ?New TF (cortrak placement) ?  ? ?ASSESSMENT:  ?Pt with hx of HTN, HLD, HIV, DM type 2, and prior stroke presented to ED from home after falling out of his chair (intoxicated at the time of event). Code stroke called in ED after pt developed right sided paralysis and confusion. Emergent CT angio showed an M1 occlusion. ? ?Pt not a candidate for TNK, but did undergo mechanical thrombectomy of the left MCA M1 segment 3/31 ? ?Noted pt positive for cocaine on admission - cocaine abuse noted as far back as 2017 in PCP notes. ? ?Patient is currently intubated on ventilator support.  Planning for SBT after waking trial today. CCM requests cortrak placement to provide medications and nutrition. ? ?Limited recent weight hx available in chart. However, on exam pt has significant depletions in both muscle and fat suggestive of long-term poor nutrition.  ? ?Discussed cleviprex with RN. Currently at high dose and will likely take some time to be titrated down once cortrak tube is placed. Pt is at high risk for refeeding. Will advance tube feed slowly and monitor electrolytes for abnormalities and the need for repletion for at least 48 hours after initiation of nutrition. This will also allow time for cleviprex to be weaned and avoid significant over-feeding ? ?MV: 11.2 L/min ?Temp (24hrs), Avg:96.5 ?F (35.8 ?C), Min:94.1 ?F (34.5 ?C), Max:98.6 ?F (37 ?C) ? ?Propofol: 19.49 ml/hr (provides 515 kcal/d) ?Cleviprex: 101ml/hr (provides 1536 kcal/d) ? ? ?Intake/Output Summary (Last 24 hours) at 05/20/2021 1151 ?Last data filed at 05/20/2021 1100 ?Gross per 24 hour  ?Intake 1512.79 ml  ?Output 750 ml  ?Net 762.79 ml  ? ?Net IO Since Admission: 762.79 mL [05/20/21 1151] ? ?Nutritionally Relevant Medications: ?Scheduled Meds: ? bictegravir-emtricitabine-tenofovir AF  1 tablet Oral Daily  ? insulin aspart  0-9 Units Subcutaneous Q4H  ? ?Continuous Infusions: ? sodium chloride 75 mL/hr at 05/20/21 0800  ? ceFAZolin    ? clevidipine 18 mg/hr (05/20/21 0852)  ? potassium chloride    ? propofol 30 mcg/kg/min (05/20/21 0622)  ? ?PRN Meds: senna-docusate ? ?Labs Reviewed: ?Potassium 3.4 ?CBG ranges from 149-185 mg/dL over the last 24 hours ?HgbA1c 7.9%  ? ?NUTRITION -  FOCUSED PHYSICAL EXAM: ?Flowsheet Row Most Recent Value  ?Orbital Region Severe depletion  ?Upper Arm Region Severe depletion  ?Thoracic and Lumbar Region Severe depletion  ?Buccal Region Severe depletion  ?Temple Region Severe depletion  ?Clavicle Bone Region Moderate depletion  ?Clavicle and Acromion Bone Region Moderate depletion  ?Scapular Bone Region  Moderate depletion  ?Dorsal Hand Mild depletion  ?Patellar Region Severe depletion  ?Anterior Thigh Region Severe depletion  ?Posterior Calf Region Moderate depletion  ?Edema (RD Assessment) None  ?Hair Reviewed  ?Eyes Unable to assess  ?Mouth Unable to assess  ?Skin Reviewed  ?Nails Reviewed  ? ?Diet Order:   ?Diet Order   ? ?       ?  Diet NPO time specified  Diet effective now       ?  ? ?  ?  ? ?  ? ? ?EDUCATION NEEDS:  ?Not appropriate for education at this time ? ?Skin:  Skin Assessment: Reviewed RN Assessment ? ?Last BM:  unsure ? ?Height:  ?Ht Readings from Last 1 Encounters:  ?05/19/21 6\' 3"  (1.905 m)  ? ? ?Weight:  ?Wt Readings from Last 1 Encounters:  ?05/20/21 72.2 kg  ? ? ?Ideal Body Weight:  89.1 kg ? ?BMI:  Body mass index is 19.9 kg/m?. ? ?Estimated Nutritional Needs:  ?Kcal:  2200-2500 kcal/d ?Protein:  110-130 g/d ?Fluid:  2.2-2.5 L/d ? ? ?Ranell Patrick, RD, LDN ?Clinical Dietitian ?RD pager # available in [place]  ?After hours/weekend pager # available in [place] ?

## 2021-05-20 NOTE — Transfer of Care (Signed)
Immediate Anesthesia Transfer of Care Note ? ?Patient: Anthony Ferguson ? ?Procedure(s) Performed: IR WITH ANESTHESIA ? ?Patient Location: ICU ? ?Anesthesia Type:General ? ?Level of Consciousness: Patient remains intubated per anesthesia plan ? ?Airway & Oxygen Therapy: Patient remains intubated per anesthesia plan and Patient placed on Ventilator (see vital sign flow sheet for setting) ? ?Post-op Assessment: Report given to RN and Post -op Vital signs reviewed and stable ? ?Post vital signs: Reviewed and stable ? ?Last Vitals:  ?Vitals Value Taken Time  ?BP 120/86 05/20/21 0217  ?Temp    ?Pulse 63 05/20/21 0224  ?Resp 16 05/20/21 0224  ?SpO2 100 % 05/20/21 0224  ?Vitals shown include unvalidated device data. ? ?Last Pain:  ?Vitals:  ? 05/19/21 2323  ?TempSrc: Oral  ?PainSc:   ?   ? ?  ? ?Complications: No notable events documented. ?

## 2021-05-20 NOTE — Progress Notes (Addendum)
Patient re-evaluated. ? ?Off sedation, calm and cooperative with mimicking on the left.  Tolerating PSV 8/5 with VT's around 400 ml.  Vital capacity 800 ml. Patient extubated. No stridor or distress, oriented to self.   Remains on Cleviprex, weaning down with scheduled antihypertensives. SLP consulted for swallow eval tomorrow.  Son at bedside and updated.  ? ?Additional CCT: 20 min  ?

## 2021-05-20 NOTE — Progress Notes (Signed)
RT NOTE: attempted SBT on patient this AM however patient's RR was 8 and minute ventilation was only 5-29mL.  Patient placed back on full support vent settings and is tolerating well.  Will continue to monitor and wean as tolerated.   ?

## 2021-05-20 NOTE — Anesthesia Preprocedure Evaluation (Addendum)
Anesthesia Evaluation  ?Patient identified by MRN, date of birth, ID band ? ?Reviewed: ?Allergy & Precautions, NPO status , Patient's Chart, lab work & pertinent test results ? ?Airway ?Mallampati: III ? ?TM Distance: >3 FB ?Neck ROM: Full ? ? ? Dental ? ?(+) Edentulous Upper, Dental Advisory Given ?  ?Pulmonary ?neg pulmonary ROS, Current Smoker,  ?  ?Pulmonary exam normal ?breath sounds clear to auscultation ? ? ? ? ? ? Cardiovascular ?hypertension, + CAD and + CABG  ?Normal cardiovascular exam ?Rhythm:Regular Rate:Normal ? ? ?  ?Neuro/Psych ?CVA   ? GI/Hepatic ?negative GI ROS, (+) Hepatitis -  ?Endo/Other  ?diabetes ? Renal/GU ?negative Renal ROS  ? ?  ?Musculoskeletal ?negative musculoskeletal ROS ?(+)  ? Abdominal ?  ?Peds ? Hematology ? ?(+) Blood dyscrasia, anemia ,   ?Anesthesia Other Findings ? ? Reproductive/Obstetrics ? ?  ? ? ? ? ? ? ? ? ? ? ? ? ? ?  ?  ? ? ? ? ? ? ? ?Anesthesia Physical ?Anesthesia Plan ? ?ASA: 3 and emergent ? ?Anesthesia Plan: General  ? ?Post-op Pain Management: Minimal or no pain anticipated  ? ?Induction: Intravenous ? ?PONV Risk Score and Plan: 1 and Ondansetron, Dexamethasone and Treatment may vary due to age or medical condition ? ?Airway Management Planned: Oral ETT ? ?Additional Equipment: Arterial line ? ?Intra-op Plan:  ? ?Post-operative Plan: Possible Post-op intubation/ventilation ? ?Informed Consent: I have reviewed the patients History and Physical, chart, labs and discussed the procedure including the risks, benefits and alternatives for the proposed anesthesia with the patient or authorized representative who has indicated his/her understanding and acceptance.  ? ? ? ?Dental advisory given ? ?Plan Discussed with: CRNA ? ?Anesthesia Plan Comments:   ? ? ? ? ? ?Anesthesia Quick Evaluation ? ?

## 2021-05-20 NOTE — Progress Notes (Addendum)
STROKE TEAM PROGRESS NOTE  ? ?INTERVAL HISTORY ?Patient is seen in his room with no family at the bedside.  Yesterday, he fell out of his chair while drinking outside and noticed a right sided facial droop.  He then presented to [facility] and was found to have right sided weakness and speech impairments.  CT  head showed left hyperdense MCA sign.  Patient was found to lave a left MCA M1 occlusion on CT angio and was taken for emergent mechanical thrombectomy.  This was successful with TICI 3 flow achieved.  He remains intubated overnight and blood pressure has been adequately controlled.  He is arousable and follows commands well on the left side and has trace movements in the right leg. ? ?Vitals:  ? 05/20/21 1115 05/20/21 1142 05/20/21 1145 05/20/21 1200  ?BP: 106/63  (!) 148/81   ?Pulse: 78 87 75   ?Resp: 18 18 16    ?Temp:    98.7 ?F (37.1 ?C)  ?TempSrc:    Oral  ?SpO2: 100% 100% 100%   ?Weight:      ?Height:      ? ?CBC:  ?Recent Labs  ?Lab 05/19/21 ?2208 05/20/21 ?0228  ?WBC 3.3* 2.8*  ?NEUTROABS 2.2 1.8  ?HGB 13.6 12.6*  ?HCT 41.0 36.1*  ?MCV 91.7 89.8  ?PLT 134* 126*  ? ?Basic Metabolic Panel:  ?Recent Labs  ?Lab 05/19/21 ?2208 05/20/21 ?0228  ?NA 137 135  ?K 3.9 3.4*  ?CL 107 106  ?CO2 24 23  ?GLUCOSE 195* 183*  ?BUN 13 9  ?CREATININE 1.00 0.90  ?CALCIUM 9.2 8.6*  ?MG  --  1.9  ?PHOS  --  2.6  ? ?Lipid Panel:  ?Recent Labs  ?Lab 05/20/21 ?0228  ?CHOL 141  ?TRIG 37  ?HDL 69  ?CHOLHDL 2.0  ?VLDL 7  ?[place] 65  ? ?HgbA1c:  ?Recent Labs  ?Lab 05/20/21 ?0228  ?HGBA1C 7.9*  ? ?Urine Drug Screen:  ?Recent Labs  ?Lab 05/19/21 ?2217  ?LABOPIA NONE DETECTED  ?COCAINSCRNUR POSITIVE*  ?LABBENZ NONE DETECTED  ?AMPHETMU NONE DETECTED  ?THCU NONE DETECTED  ?LABBARB NONE DETECTED  ?  ?Alcohol Level  ?Recent Labs  ?Lab 05/19/21 ?2208  ?ETH <10  ? ? ?IMAGING past 24 hours ?CT ANGIO HEAD NECK W WO CM ? ?Result Date: 05/19/2021 ?CLINICAL DATA:  Initial evaluation for acute right-sided weakness. EXAM: CT ANGIOGRAPHY  HEAD AND NECK TECHNIQUE: Multidetector CT imaging of the head and neck was performed using the standard protocol during bolus administration of intravenous contrast. Multiplanar CT image reconstructions and MIPs were obtained to evaluate the vascular anatomy. Carotid stenosis measurements (when applicable) are obtained utilizing NASCET criteria, using the distal internal carotid diameter as the denominator. RADIATION DOSE REDUCTION: This exam was performed according to the departmental dose-optimization program which includes automated exposure control, adjustment of the mA and/or kV according to patient size and/or use of iterative reconstruction technique. CONTRAST:  18mL OMNIPAQUE IOHEXOL 350 MG/ML SOLN COMPARISON:  Prior head CT from earlier the same day. FINDINGS: CTA NECK FINDINGS Aortic arch: Visualized aortic arch normal caliber with normal branch pattern. Mild-to-moderate atheromatous change about the arch itself. No hemodynamically significant stenosis about the origin of the great vessels. Right carotid system: Scattered atheromatous plaque within the right common and internal carotid arteries without significant stenosis or dissection. Left carotid system: Scattered atheromatous plaque within the left common and internal carotid arteries without significant stenosis or evidence for dissection. Vertebral arteries: Both vertebral arteries arise from subclavian arteries. No  proximal subclavian artery stenosis. Atheromatous plaque at the origin of the left vertebral artery with associated moderate to severe stenosis. Right vertebral artery slightly dominant. Vertebral arteries otherwise patent distally without stenosis or dissection. Skeleton: No discrete or worrisome osseous lesions. Patient is edentulous. Prominent bulky anterior osteophytic spurring noted throughout the cervical spine. Other neck: No other acute soft tissue abnormality within the neck. Layering secretions noted within the hypopharynx.  Upper chest: Visualized upper chest demonstrates no acute finding. Review of the MIP images confirms the above findings CTA HEAD FINDINGS Anterior circulation: Petrous segments patent bilaterally. Scattered atheromatous plaque within the carotid siphons with associated mild to moderate narrowing. A1 segments patent bilaterally. Normal anterior communicating artery complex. Anterior cerebral arteries patent without stenosis. Right M1 segment. No proximal right MCA branch occlusion. Distal right MCA branches well perfused. On the left, there is occlusive versus subocclusive thrombus within the proximal-mid left M1 segment (series 6, image 101). Attenuated flow within the left MCA branches distally could be collateral nature or possibly related to subocclusive thrombus. Decreased attenuation seen throughout the left MCA territory as compared to the right. Posterior circulation: Both V4 segments patent without significant stenosis. Right PICA patent. Left PICA not seen. Basilar patent to its distal aspect without stenosis. Superior cerebellar and posterior cerebral arteries patent bilaterally. Venous sinuses: Grossly patent allowing for timing the contrast bolus. Anatomic variants: None significant. Review of the MIP images confirms the above findings IMPRESSION: 1. Positive CTA with occlusive thrombus within the proximal-mid left M1 segment. Attenuated flow within the left MCA branches distally could be collateral in nature or possibly related to subocclusive thrombus. 2. Atheromatous change about the carotid siphons with associated mild to moderate multifocal narrowing. No hemodynamically significant stenosis about either carotid artery system within the neck. 3. Moderate to severe stenosis at the origin of the left vertebral artery. Critical Value/emergent results were called by telephone at the time of interpretation on 05/19/2021 at 11:08 pm to provider MARGAUX VENTER , who verbally acknowledged these results.  Electronically Signed   By: Jeannine Boga M.D.   On: 05/19/2021 23:22  ? ?CT Head Wo Contrast ? ?Result Date: 05/19/2021 ?CLINICAL DATA:  head injury. intoxicated; facial trauma EXAM: CT HEAD WITHOUT CONTRAST CT MAXILLOFACIAL WITHOUT CONTRAST TECHNIQUE: Multidetector CT imaging of the head and maxillofacial structures were performed using the standard protocol without intravenous contrast. Multiplanar CT image reconstructions of the maxillofacial structures were also generated. RADIATION DOSE REDUCTION: This exam was performed according to the departmental dose-optimization program which includes automated exposure control, adjustment of the mA and/or kV according to patient size and/or use of iterative reconstruction technique. COMPARISON:  None. FINDINGS: CT HEAD FINDINGS BRAIN: BRAIN Patchy and confluent areas of decreased attenuation are noted throughout the deep and periventricular white matter of the cerebral hemispheres bilaterally, compatible with chronic microvascular ischemic disease. No evidence of large-territorial acute infarction. No parenchymal hemorrhage. No mass lesion. No extra-axial collection. No mass effect or midline shift. No hydrocephalus. Basilar cisterns are patent. Vascular: No hyperdense vessel. Atherosclerotic calcifications are present within the cavernous internal carotid arteries. Skull: No acute fracture or focal lesion. Other: None. CT MAXILLOFACIAL FINDINGS Osseous: Age-indeterminate minimally displaced right nasal bone fracture. No definite acute displaced fracture or mandibular dislocation. No destructive process. Patient is edentulous. Sinuses/Orbits: Paranasal sinuses and mastoid air cells are clear. The orbits are unremarkable. Soft tissues: Carotid artery calcification within the neck. Visualized upper cervical spine: Bulky osteophyte formation. IMPRESSION: 1. No acute intracranial abnormality. 2. Age-indeterminate minimally displaced  right nasal bone fracture.  Otherwise no acute displaced facial fracture. Electronically Signed   By: Iven Finn M.D.   On: 05/19/2021 22:49  ? ?CT CEREBRAL PERFUSION W CONTRAST ? ?Result Date: 05/20/2021 ?CLINICAL DATA:  Follow-up examinati

## 2021-05-20 NOTE — TOC CAGE-AID Note (Signed)
Transition of Care (TOC) - CAGE-AID Screening ? ? ?Patient Details  ?Name: Anthony Ferguson ?MRN: [id number] ?Date of Birth: Dec 23, 1950 ? ?Transition of Care (TOC) CM/SW Contact:    ?Celester Morgan C Tarpley-Carter, LCSWA ?Phone Number: ?05/20/2021, 11:54 AM ? ? ?Clinical Narrative: ?Pt is unable to participate in Cage Aid. ?Pt is currently not in room.  Pt is also intubated. ? ?Insurance underwriter, MSW, LCSW-A ?Pronouns:  She/Her/Hers ?[facility]Transitions of Care ?Clinical Social Worker ?Direct Number:  [phone] ?[email or web address]  ? ?CAGE-AID Screening: ?Substance Abuse Screening unable to be completed due to: : Patient unable to participate ? ?  ?  ?  ?  ?  ? ?Substance Abuse Education Offered: No ? ?  ? ? ? ? ? ? ?

## 2021-05-20 NOTE — Progress Notes (Signed)
eLink Physician-Brief Progress Note ?Patient Name: Anthony Ferguson ?DOB: [birth year] ?MRN: [id number] ? ? ?Date of Service ? 05/20/2021  ?HPI/Events of Note ? Patient is s/p left MCA M 1 branch IR thrombectomy and was transferred to the ICU after the procedure  intubated, sedated and mechanically ventilated.  ?eICU Interventions ? New Patient Evaluation.  ? ? ? ?  ? ?Anthony Ferguson ?05/20/2021, 5:15 AM ?

## 2021-05-20 NOTE — Procedures (Signed)
Cortrak ? ?Person Inserting Tube:  Anthony Ferguson, RD ?Tube Type:  Cortrak - 43 inches ?Tube Size:  10 ?Tube Location:  Left nare ?Secured by: Bridle ?Technique Used to Measure Tube Placement:  Marking at nare/corner of mouth ?Cortrak Secured At:  70 cm ? ?Cortrak Tube Team Note: ? ?Consult received to place a Cortrak feeding tube.  ? ?X-ray is required, abdominal x-ray has been ordered by the Cortrak team. Please confirm tube placement before using the Cortrak tube.  ? ?If the tube becomes dislodged please keep the tube and contact the Cortrak team at [email or web address] (password [email or web address]) for replacement.  ?If after hours and replacement cannot be delayed, place a NG tube and confirm placement with an abdominal x-ray.  ? ? ? ?Tyson Parkison A., MS, RD, LDN (she/her/hers) ?RD pager number and weekend/on-call pager number located in [employer]. ? ? ?

## 2021-05-20 NOTE — Procedures (Signed)
INR. ?Left common carotid arteriogram. ?Right CFA approach. ?Findings. ?1.  Occluded left middle cerebral artery M1 segment. ?Status post endovascular complete revascularization of the left middle cerebral artery with 1 pass with softer 4 mm x 40 mm X I retrieved the back and palpate aspiration achieving aTICI 3 right breast relation. ? ?Post CT of the brain demonstrates no intracranial hemorrhage. ?7 [place] exoseal  seal closure device, and quickly minimal compression applied for hemostasis at the right groin (.  Distal pulses will dopplerable unchanged. ?Patient left intubated for airway protection because of patient's medical condition. ?Arlean Hopping MD ?

## 2021-05-20 NOTE — Consult Note (Addendum)
? ?NAME:  Anthony Ferguson, MRN:  [id number], DOB:  [birth year], LOS: 0 ?ADMISSION DATE:  05/19/2021 CONSULTATION DATE:  05/20/2021 ?REFERRING MD:  Estanislado Pandy - NIR CHIEF COMPLAINT:  Stroke, L MCA M1 occlusion  ? ?History of Present Illness:  ?71 year old man who presented to [facility] ED 3/30 after a fall from his chair at home due to suspected intoxication. Patient did hit his head, unknown LOC. While in the ED, patient developed acute R-sided weakness, R facial droop and left gaze deviation prompting Code Stroke. LKW unknown, therefore no TNK given. s/p thrombectomy with occluded left MCA m1 segment with TICI 3  ? ?PMHx significant for HTN, HLD, T2DM, Afib (not on AC), EtOH abuse, HIV (CD4 320 2021)). ?  ? ?Pertinent Medical History:  ? ?Past Medical History:  ?Diagnosis Date  ? HIV (human immunodeficiency virus infection) ([place])   ? Hypercholesteremia   ? Hypertension   ? Type 2 diabetes mellitus ([place])   ? ?Significant Hospital Events: ?Including procedures, antibiotic start and stop dates in addition to other pertinent events   ?3/30 - Presented to [facility] ED after fall from chair at home. R sided weakness and L gaze deviation. Code Stroke called. CT Head with hyperdense L MCA. CTA Head/Neck with L MCA M1 occlusion. Underwent mechanical thrombectomy with TICI 3 revascularization. ? ?Interim History / Subjective:  ? ? ?Objective:  ?Blood pressure 135/83, pulse (!) 101, temperature (!) 94.3 ?F (34.6 ?C), temperature source Oral, resp. rate 18, height 6\' 3"  (1.905 m), weight 72.2 kg, SpO2 100 %. ?   ?Vent Mode: PRVC ?FiO2 (%):  [40 %-60 %] 40 % ?Set Rate:  [18 bmp] 18 bmp ?Vt Set:  [id number] mL] 670 mL ?PEEP:  [5 cmH20] 5 cmH20 ?Plateau Pressure:  [17 cmH20-18 cmH20] 17 cmH20  ? ?Intake/Output Summary (Last 24 hours) at 05/20/2021 0734 ?Last data filed at 05/20/2021 0600 ?Gross per 24 hour  ?Intake 916.12 ml  ?Output 500 ml  ?Net 416.12 ml  ? ? ?Filed Weights  ? 05/19/21 1958 05/20/21 0217  ?Weight: 76.2 kg 72.2 kg  ? ?Physical  Examination: ?General: Chronically ill-appearing elderly man in NAD. ?HEENT: [place]/AT, anicteric sclera, PERRL, dry mucous membranes. ?Neuro: Sedated. Eyes with deviation to the left, follows commands on the left upper extremity.  ?CV: SR on telemetry.  RRR, no m/g/r. ?PULM: Synchronous with ventilator, clear lung fields, minimal secretions via ETT ?GI: Soft, nontender, nondistended. Normoactive bowel sounds. ?Extremities: No deformities ?Skin: Warm/dry, multiple small erythematous cuts and scrapes.. ? ?Resolved Hospital Problem List:  ? ? ?Assessment & Plan:  ?Ischemic stroke 2/2 L MCA M1 occlusion ?- no  ?- S/p NIR intervention (Deveshwar), L MCA M1 recanalization (TICI 3) ?- Goal SBP 120-140 ?- MRI Brain pending ?- Secondary stroke work up per Neurology  ?- ASA initiation timing per Neurology. Resume statin when PO access available.  ?- Neuro checks Q4H ?  ?Acute hypoxemic respiratory failure 2/2 procedure ?- PAD protocol for sedation: Propofol for goal RASS 0 to -1 ?- Minimal vent settings.  SAT/SBT pending. ? ?Atrial fibrillation ?CHA2DS2-VASc score 3 ( not on AC.) ?- ECHO pending ?- Rate controlled overnight, hold home diltiazem until PO access obtained ?- Hold systemic anticoagulation in setting of acute stroke with MRI Brain pending ? ?Type 2 DM ?- Hold home oral agents ?- Continue SSI q 4 hours ? ?Hypertension ?- Requiring Cleviprex.  ?- Start lisinopril, resume home dilt once access obtained ? ?HIV ?Well controlled, annual HIV DNA or CD4 ?- Continue HAART  when access available.  ? ?Hypokalemia ?- Potassium replacement ? ?Best Practice: (right click and "Reselect all SmartList Selections" daily)  ?Nutrition: Cortrak placement ordered. Nutrition consult for tube feeds ?DVT: Discuss initiation of DVT ppx with neuro today ? ?Labs:  ?CBC: ?Recent Labs  ?Lab 05/19/21 ?2208 05/20/21 ?0228  ?WBC 3.3* 2.8*  ?NEUTROABS 2.2 1.8  ?HGB 13.6 12.6*  ?HCT 41.0 36.1*  ?MCV 91.7 89.8  ?PLT 134* 126*  ? ? ?Basic Metabolic  Panel: ?Recent Labs  ?Lab 05/19/21 ?2208 05/20/21 ?0228  ?NA 137 135  ?K 3.9 3.4*  ?CL 107 106  ?CO2 24 23  ?GLUCOSE 195* 183*  ?BUN 13 9  ?CREATININE 1.00 0.90  ?CALCIUM 9.2 8.6*  ? ? ?GFR: ?Estimated Creatinine Clearance: 75.8 mL/min (by C-G formula based on SCr of 0.9 mg/dL). ?Recent Labs  ?Lab 05/19/21 ?2208 05/20/21 ?0228  ?WBC 3.3* 2.8*  ? ? ?Liver Function Tests: ?No results for input(s): AST, ALT, ALKPHOS, BILITOT, PROT, ALBUMIN in the last 168 hours. ?No results for input(s): LIPASE, AMYLASE in the last 168 hours. ?No results for input(s): AMMONIA in the last 168 hours. ? ?ABG: ?No results found for: PHART, PCO2ART, PO2ART, HCO3, TCO2, ACIDBASEDEF, O2SAT  ? ?Coagulation Profile: ?Recent Labs  ?Lab 05/19/21 ?2208  ?INR 1.1  ? ? ?Cardiac Enzymes: ?No results for input(s): CKTOTAL, CKMB, CKMBINDEX, TROPONINI in the last 168 hours. ? ?HbA1C: ?Hgb A1c MFr Bld  ?Date/Time Value Ref Range Status  ?05/20/2021 02:28 AM 7.9 (H) 4.8 - 5.6 % Final  ?  Comment:  ?  (NOTE) ?Pre diabetes:          5.7%-6.4% ? ?Diabetes:              >6.4% ? ?Glycemic control for   <7.0% ?adults with diabetes ?  ?04/18/2011 03:30 PM 6.2 (H) <5.7 % Final  ?  Comment:  ?                                                                         ?According to the ADA Clinical Practice Recommendations for 2011, when ?HbA1c is used as a screening test: ?  ?  >=6.5%   Diagnostic of Diabetes Mellitus ?           (if abnormal result is confirmed) ?  ?5.7-6.4%   Increased risk of developing Diabetes Mellitus ?  ?References:Diagnosis and Classification of Diabetes Mellitus,Diabetes ?[id number] 1):S62-S69 and Standards of Medical Care in         ?Diabetes - 2011,Diabetes Care,2011,34 (Suppl 1):S11-S61.  ? ?CBG: ?Recent Labs  ?Lab 05/19/21 ?2225  ?GLUCAP 149*  ? ? ?Critical care time: 36 minutes  ? ?Minda Ditto, NP ?[facility] ?05/20/21 7:34 AM ? ?Please see [email or web address] for pager details. ? ?From 7A-7P if no response,  please call [phone] ?After hours, please call ELink [phone] ? ?

## 2021-05-20 NOTE — Progress Notes (Signed)
Patient to CT for CTA at 2243, back from CT at 2254 ?

## 2021-05-20 NOTE — Progress Notes (Signed)
Communicated CTA final read per rad report to Dr. Pricilla Larsson at 2329 ?

## 2021-05-20 NOTE — Progress Notes (Signed)
Dr. Pricilla Larsson at bedside via telestroke cart at 2217 ?

## 2021-05-21 ENCOUNTER — Inpatient Hospital Stay (HOSPITAL_COMMUNITY): Payer: Medicare HMO

## 2021-05-21 DIAGNOSIS — I63512 Cerebral infarction due to unspecified occlusion or stenosis of left middle cerebral artery: Secondary | ICD-10-CM | POA: Diagnosis not present

## 2021-05-21 LAB — GLUCOSE, CAPILLARY
Glucose-Capillary: 168 mg/dL — ABNORMAL HIGH (ref 70–99)
Glucose-Capillary: 193 mg/dL — ABNORMAL HIGH (ref 70–99)
Glucose-Capillary: 166 mg/dL — ABNORMAL HIGH (ref 70–99)
Glucose-Capillary: 151 mg/dL — ABNORMAL HIGH (ref 70–99)
Glucose-Capillary: 218 mg/dL — ABNORMAL HIGH (ref 70–99)

## 2021-05-21 LAB — BASIC METABOLIC PANEL
Anion gap: 7 (ref 5–15)
BUN: 10 mg/dL (ref 8–23)
CO2: 21 mmol/L — ABNORMAL LOW (ref 22–32)
Calcium: 8.9 mg/dL (ref 8.9–10.3)
Chloride: 107 mmol/L (ref 98–111)
Creatinine, Ser: 0.93 mg/dL (ref 0.61–1.24)
GFR, Estimated: >60 mL/min (ref >60)
Glucose, Bld: 196 mg/dL — ABNORMAL HIGH (ref 70–99)
Potassium: 3.6 mmol/L (ref 3.5–5.1)
Sodium: 135 mmol/L (ref 135–145)

## 2021-05-21 LAB — CBC
HCT: 36.3 % — ABNORMAL LOW (ref 39.0–52.0)
Hemoglobin: 12.7 g/dL — ABNORMAL LOW (ref 13.0–17.0)
MCH: 31.1 pg (ref 26.0–34.0)
MCHC: 35 g/dL (ref 30.0–36.0)
MCV: 89 fL (ref 80.0–100.0)
Platelets: 131 10*3/uL — ABNORMAL LOW (ref 150–400)
RBC: 4.08 MIL/uL — ABNORMAL LOW (ref 4.22–5.81)
RDW: 13.7 % (ref 11.5–15.5)
WBC: 6.3 10*3/uL (ref 4.0–10.5)
nRBC: 0 % (ref 0.0–0.2)

## 2021-05-21 LAB — MAGNESIUM
Magnesium: 2 mg/dL (ref 1.7–2.4)
Magnesium: 1.9 mg/dL (ref 1.7–2.4)

## 2021-05-21 LAB — PHOSPHORUS
Phosphorus: 2.6 mg/dL (ref 2.5–4.6)
Phosphorus: 3.1 mg/dL (ref 2.5–4.6)

## 2021-05-21 LAB — POCT ACTIVATED CLOTTING TIME: Activated Clotting Time: 149 seconds

## 2021-05-21 MED ORDER — LORAZEPAM 2 MG/ML IJ SOLN
1.0000 mg | Freq: Once | INTRAMUSCULAR | Status: AC
  Administered 2021-05-21: 1 mg via INTRAVENOUS
  Filled 2021-05-21: qty 1

## 2021-05-21 MED ORDER — BICTEGRAVIR-EMTRICITAB-TENOFOV 50-200-25 MG PO TABS
1.0000 | ORAL_TABLET | Freq: Every day | ORAL | Status: DC
  Administered 2021-05-21 – 2021-05-25 (×5): 1 via ORAL
  Filled 2021-05-21 (×6): qty 1

## 2021-05-21 MED ORDER — HYDRALAZINE HCL 20 MG/ML IJ SOLN: 10.0000 mg | INTRAMUSCULAR | Status: DC | PRN

## 2021-05-21 NOTE — Progress Notes (Signed)
Pt taken to MRI by this person and Al Pimple, RN ?

## 2021-05-21 NOTE — Evaluation (Signed)
Speech Language Pathology Evaluation ?Patient Details ?Name: Anthony Ferguson ?MRN: [id number] ?DOB: Dec 22, 1950 ?Today's Date: 05/21/2021 ?Time: [id number] ?SLP Time Calculation (min) (ACUTE ONLY): 14 min ? ?Problem List:  ?Patient Active Problem List  ? Diagnosis Date Noted  ? Acute ischemic left MCA stroke ([place]) 05/20/2021  ? 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 Protein-calorie malnutrition, severe 05/20/2021  ? Erectile dysfunction 11/26/2018  ? Cocaine abuse ([place]) 04/14/2015  ? Normocytic anemia 04/14/2015  ? Thrombocytopenia ([place]) 04/14/2015  ? Human immunodeficiency virus (HIV) disease ([place]) 03/03/2006  ? Chronic hepatitis C virus infection ([place]) 03/03/2006  ? Type 2 diabetes mellitus ([place]) 03/03/2006  ? TOBACCO USER 03/03/2006  ? Coronary atherosclerosis 03/03/2006  ? ?Past Medical History:  ?Past Medical History:  ?Diagnosis Date  ? HIV (human immunodeficiency virus infection) ([place])   ? Hypercholesteremia   ? Hypertension   ? Type 2 diabetes mellitus ([place])   ? ?Past Surgical History:  ?Past Surgical History:  ?Procedure Laterality Date  ? CARDIAC SURGERY    ? CIRCUMCISION    ? CORONARY ARTERY BYPASS GRAFT    ? RADIOLOGY WITH ANESTHESIA N/A 05/19/2021  ? Procedure: IR WITH ANESTHESIA;  Surgeon: Luanne Bras, MD;  Location: [place];  Service: Radiology;  Laterality: N/A;  ? ?HPI:  ?71 y.o. male presented to [facility] ED 3/31 with s/s stroke. CT head revealed left MCA M1 occlusion. Pt transfered to [facility] for emergent mechanical thrombectomy. Intubated for procedure, extubated same day.  PMH HIV, HTN, hypercholesterolemia diabetes type 2, + cocaine use.  ? ?Assessment / Plan / Recommendation ?Clinical Impression ? Pt presents with dysarthria of speech and at least some component of expressive aphasia that will be easier to tease out as he becomes more alert.  There was fluent output, limited spontaneous communication.  Basic naming to confrontation 10/10; responsive naming 5/5; repetition intact.  Followed  one-step commands; two-step commands 60% accuracy.  Tended to perseverate.  Voice was hypophonic and mildly hoarse. There was decreased initiation and generally paucity of information.  Recommend SLP f/u to address overall communication.  Pt stated that he lives alone in an apartment in [place] - level of family support available unknown at this time. Will follow. ?   ?SLP Assessment ? SLP Recommendation/Assessment: Patient needs continued [place] [facility] ?SLP Visit Diagnosis: Aphasia (R47.01);Dysarthria and anarthria (R47.1)  ?  ?Recommendations for follow up therapy are one component of a multi-disciplinary discharge planning process, led by the attending physician.  Recommendations may be updated based on patient status, additional functional criteria and insurance authorization. ?   ?Follow Up Recommendations ? Other (comment) (tba)  ?  ?Assistance Recommended at Discharge ? Frequent or constant Supervision/Assistance  ?Functional Status Assessment Patient has had a recent decline in their functional status and demonstrates the ability to make significant improvements in function in a reasonable and predictable amount of time.  ?Frequency and Duration min 2x/week  ?2 weeks ?  ?   ?SLP Evaluation ?Cognition ? Overall Cognitive Status: Difficult to assess ?Arousal/Alertness: Awake/alert ?Orientation Level: Oriented to person;Disoriented to place;Disoriented to time ?Attention: Sustained ?Sustained Attention: Appears intact  ?  ?   ?Comprehension ? Auditory Comprehension ?Overall Auditory Comprehension: Impaired ?Yes/No Questions: Within Functional Limits ?Commands: Impaired ?One Step Basic Commands: 75-100% accurate ?Two Step Basic Commands: 50-74% accurate ?Conversation: Simple ?Reading Comprehension ?Reading Status: Not tested  ?  ?Expression Expression ?Primary Mode of Expression: Verbal ?Verbal Expression ?Overall Verbal Expression: Impaired ?Initiation: Impaired ?Automatic Speech:  Counting;Month of year ?  Level of Generative/Spontaneous Verbalization: Phrase ?Repetition: No impairment ?Naming: Impairment ?Responsive: 76-100% accurate ?Confrontation: Within functional limits ?Divergent: 0-24% accurate ?Verbal Errors: Perseveration   ?Oral / Motor ? Oral Motor/Sensory Function ?Overall Oral Motor/Sensory Function: Mild impairment ?Facial ROM: Reduced right ?Facial Symmetry: Abnormal symmetry right;Suspected CN VII (facial) dysfunction ?Motor Speech ?Overall Motor Speech: Impaired ?Respiration: Within functional limits ?Phonation: Hoarse;Low vocal intensity ?Resonance: Within functional limits ?Articulation: Impaired ?Level of Impairment: Phrase ?Intelligibility: Intelligibility reduced ?Word: 75-100% accurate ?Phrase: 50-74% accurate ?Sentence: 50-74% accurate ?Motor Planning: Witnin functional limits   ?        ? ?Anthony Ferguson ?05/21/2021, 9:51 AM ?Estill Bamberg L. Bryer Cozzolino, MA CCC/SLP ?Acute Rehabilitation Services ?Office number [phone] ?Pager [phone] ? ?

## 2021-05-21 NOTE — Progress Notes (Signed)
Order rec'd to d/c L fem a-line by primary team. IR PA at bedside shortly after and asked to clarify whether a-line was considered a sheath and if needed to be d/c'd according to sheath removal policy. Per request, Engineer, civil (consulting) at bedside to remove access per our request. ACT 149 and access removed by [employer] RN per policy.Pt tolerated well. See flowsheet for ongoing site assessments.  ?

## 2021-05-21 NOTE — Progress Notes (Signed)
Returned to 4N31 from MRI ?

## 2021-05-21 NOTE — Progress Notes (Signed)
OT Cancellation Note ? ?Patient Details ?Name: Anthony Ferguson ?MRN: [id number] ?DOB: 1950-09-13 ? ? ?Cancelled Treatment:    Reason Eval/Treat Not Completed: Active bedrest order. ? ?Noami Bove C., OTR/L ?Acute Rehabilitation Services ?Pager [phone] ?Office [phone] ? ? ?Lucille Passy M ?05/21/2021, 4:45 PM ?

## 2021-05-21 NOTE — Progress Notes (Signed)
Left arterial femoral sheath discontinued at 1030. Manual pressure held for 20 minutes.Vitals remained stable throughout procedure. Site was a Level 0 at the end of the procedure. Dicussed post removal instructions with Traci RN at bedside.  ?

## 2021-05-21 NOTE — Progress Notes (Signed)
PT Cancellation Note ? ?Patient Details ?Name: Anthony Ferguson ?MRN: [id number] ?DOB: 1950/10/18 ? ? ?Cancelled Treatment:    Reason Eval/Treat Not Completed: Other (comment). Pt with potential sheath removed around 10:30 am today. Per protocol, pt will be on bedrest for 24 hrs for safety. Will plan to follow-up another day as able. ? ? ?Mackinley Gurney, PT, DPT ?Acute Rehabilitation Services  ?Pager: [phone] ?Office: [phone] ? ? ? ?Henrene Dodge Pettis ?05/21/2021, 4:06 PM ? ? ?

## 2021-05-21 NOTE — Evaluation (Signed)
Clinical/Bedside Swallow Evaluation ?Patient Details  ?Name: SHAJUAN DONE ?MRN: [id number] ?Date of Birth: Jun 28, 1950 ? ?Today's Date: 05/21/2021 ?Time: SLP Start Time (ACUTE ONLY): [id number] SLP Stop Time (ACUTE ONLY): [id number] ?SLP Time Calculation (min) (ACUTE ONLY): 16 min ? ?Past Medical History:  ?Past Medical History:  ?Diagnosis Date  ? HIV (human immunodeficiency virus infection) ([place])   ? Hypercholesteremia   ? Hypertension   ? Type 2 diabetes mellitus ([place])   ? ?Past Surgical History:  ?Past Surgical History:  ?Procedure Laterality Date  ? CARDIAC SURGERY    ? CIRCUMCISION    ? CORONARY ARTERY BYPASS GRAFT    ? RADIOLOGY WITH ANESTHESIA N/A 05/19/2021  ? Procedure: IR WITH ANESTHESIA;  Surgeon: Luanne Bras, MD;  Location: [place];  Service: Radiology;  Laterality: N/A;  ? ?HPI:  ?71 y.o. male presented to [facility] ED 3/31 with s/s stroke. CT head revealed left MCA M1 occlusion. Pt transfered to [facility] for emergent mechanical thrombectomy. Intubated for procedure, extubated same day.  PMH HIV, HTN, hypercholesterolemia diabetes type 2, + cocaine use.  ?  ?Assessment / Plan / Recommendation  ?Clinical Impression ? Pt was sleepy but participatory.  Oral care provided.  Presents with right lower facial asymmetry, flattened nasolabial fold; tongue with mild deviation to right and reduced extension.  Pt accepted applesauce and sips of water from a straw with adequate attention, mild spillage from right side without awareness, no s/s of aspiration after repeated sequential sips of thin liquid.  Recommend initiating a full liquid diet due to AMS.  Anticipate will be able to advance diet rapidly as MS improves.D/W RN. SLP will follow. ?SLP Visit Diagnosis: Dysphagia, oropharyngeal phase (R13.12) ?   ?   ?Diet Recommendation   Full liquids ? ?Medication Administration: Whole meds with puree  ?  ?Other  Recommendations Oral Care Recommendations: Oral care BID   ? ?Recommendations for follow up therapy are one component of a  multi-disciplinary discharge planning process, led by the attending physician.  Recommendations may be updated based on patient status, additional functional criteria and insurance authorization. ? ?Follow up Recommendations Other (comment) (tba)  ? ? ?  ?Assistance Recommended at Discharge Frequent or constant Supervision/Assistance  ?Functional Status Assessment Patient has had a recent decline in their functional status and demonstrates the ability to make significant improvements in function in a reasonable and predictable amount of time.  ?Frequency and Duration min 2x/week  ?2 weeks ?  ?   ? ?Prognosis Prognosis for Safe Diet Advancement: Good  ? ?  ? ?Swallow Study   ?General Date of Onset: 05/19/21 ?HPI: 71 y.o. male presented to [facility] ED 3/31 with s/s stroke. CT head revealed left MCA M1 occlusion. Pt transfered to [facility] for emergent mechanical thrombectomy. Intubated for procedure, extubated same day.  PMH HIV, HTN, hypercholesterolemia diabetes type 2, + cocaine use. ?Type of Study: Bedside Swallow Evaluation ?Previous Swallow Assessment: no ?Diet Prior to this Study: NPO ?Temperature Spikes Noted: No ?Respiratory Status: Nasal cannula ?History of Recent Intubation: Yes ?Length of Intubations (days): 1 days ?Date extubated: 05/20/21 ?Behavior/Cognition: Lethargic/Drowsy ?Oral Cavity Assessment: Dried secretions ?Oral Care Completed by SLP: Yes ?Oral Cavity - Dentition: Edentulous ?Self-Feeding Abilities: Total assist ?Patient Positioning: Upright in bed ?Baseline Vocal Quality: Low vocal intensity ?Volitional Cough: Weak ?Volitional Swallow: Able to elicit  ?  ?Oral/Motor/Sensory Function Overall Oral Motor/Sensory Function: Mild impairment ?Facial ROM: Reduced right ?Facial Symmetry: Abnormal symmetry right;Suspected CN VII (facial) dysfunction   ?Ice Chips Ice chips: Within  functional limits   ?Thin Liquid Thin Liquid: Within functional limits  ?  ?Nectar Thick Nectar Thick Liquid: Not tested   ?Honey  Thick Honey Thick Liquid: Not tested   ?Puree Puree: Within functional limits   ?Solid ? ? ?  Solid: Not tested  ? ?  ? ?[name] ?05/21/2021,9:41 AM ? ?Tarvis Blossom L. Romesha Scherer, MA CCC/SLP ?Acute Rehabilitation Services ?Office number [phone] ?Pager [phone] ? ? ?

## 2021-05-21 NOTE — Progress Notes (Addendum)
STROKE TEAM PROGRESS NOTE  ? ?INTERVAL HISTORY ?Patient is seen in his room with no family at the bedside.  He no longer requires cleviprex for BP control.  He was extubated yesterday and is doing well.  His neurological exam is stable. ? ?Vitals:  ? 05/21/21 0900 05/21/21 1000 05/21/21 1015 05/21/21 1030  ?BP: 122/75 122/77    ?Pulse: 75 64 70 67  ?Resp: 19 (!) 24 (!) 23 20  ?Temp:      ?TempSrc:      ?SpO2: 98% 98% 99% 100%  ?Weight:      ?Height:      ? ?CBC:  ?Recent Labs  ?Lab 05/19/21 ?2208 05/20/21 ?0228 05/21/21 ?[id number]  ?WBC 3.3* 2.8* 6.3  ?NEUTROABS 2.2 1.8  --   ?HGB 13.6 12.6* 12.7*  ?HCT 41.0 36.1* 36.3*  ?MCV 91.7 89.8 89.0  ?PLT 134* 126* 131*  ? ? ?Basic Metabolic Panel:  ?Recent Labs  ?Lab 05/20/21 ?0228 05/20/21 ?1628 05/21/21 ?[id number]  ?NA 135  --  135  ?K 3.4*  --  3.6  ?CL 106  --  107  ?CO2 23  --  21*  ?GLUCOSE 183*  --  196*  ?BUN 9  --  10  ?CREATININE 0.90  --  0.93  ?CALCIUM 8.6*  --  8.9  ?MG 1.9 2.5* 2.0  ?PHOS 2.6 3.4 2.6  ? ? ?Lipid Panel:  ?Recent Labs  ?Lab 05/20/21 ?0228  ?CHOL 141  ?TRIG 37  ?HDL 69  ?CHOLHDL 2.0  ?VLDL 7  ?[place] 65  ? ? ?HgbA1c:  ?Recent Labs  ?Lab 05/20/21 ?0228  ?HGBA1C 7.9*  ? ? ?Urine Drug Screen:  ?Recent Labs  ?Lab 05/19/21 ?2217  ?LABOPIA NONE DETECTED  ?COCAINSCRNUR POSITIVE*  ?LABBENZ NONE DETECTED  ?AMPHETMU NONE DETECTED  ?THCU NONE DETECTED  ?LABBARB NONE DETECTED  ? ?  ?Alcohol Level  ?Recent Labs  ?Lab 05/19/21 ?2208  ?ETH <10  ? ? ? ?IMAGING past 24 hours ?DG Abd Portable 1V ? ?Result Date: 05/20/2021 ?CLINICAL DATA:  Placement of enteric tube EXAM: PORTABLE ABDOMEN - 1 VIEW COMPARISON:  None. FINDINGS: Tip of enteric tube is seen in the fundus of the stomach. Bowel gas pattern is nonspecific. Right side of abdomen and pelvis are not included in their entirety. Degenerative changes are noted in the lumbar spine. Metallic sutures are seen in the sternum. IMPRESSION: Tip of enteric tube is seen in the fundus of the stomach. Electronically Signed   By:  Elmer Picker M.D.   On: 05/20/2021 12:30   ? ?PHYSICAL EXAM ?General:  Thin-appearing African American patient in no acute distress.   ?Respiratory:  Respirations regular and unlabored ? ? ?NEURO:  ?Mental Status: AA&Ox3  ?Speech/Language: speech is without dysarthria or aphasia.  Naming, fluency, and comprehension intact. ? ?Cranial Nerves:  ?II: PERRL. Visual fields full.  ?III, IV, VI: EOMI. Eyelids elevate symmetrically. Left gaze preference but can cross midline ?V: Sensation is intact to light touch and symmetrical to face.  ?VII: Smile is symmetrical.  ?VIII: hearing intact to voice. ?IX, X: Phonation is normal.  ?XII: tongue is midline without fasciculations. ?Motor: 5/5 strength to LUE and LLE, 0/5 to RUE, 2/5 to RLE  ?Tone: is normal and bulk is normal ?Sensation- Intact to light touch bilaterally.  ?Gait- deferred ? ? ?ASSESSMENT/PLAN ?[name] is a 71 y.o. male with history of HTN, HIV, HLD,  DM2 and Eliquis use for atrial fibrillation presenting after he fell out of  his chair while drinking outside and noticed a right sided facial droop.  He then presented to [facility] and was found to have right sided weakness and speech impairments.  CAT showed left hyperdense MCA sign.  Patient was found to lave a left MCA M1 occlusion and was taken for mechanical thrombectomy.  This was successful with TICI 3 flow achieved.  He was extubated on 3/31. ? ?Stroke:  left MCA M1 branch infarct likely secondary due to embolism in setting of known atrial fibrillation and unclear compliance with Eliquis as well as cocaine abuse s/p thrombectomy with TICI 3 flow achieved ?CT head No acute abnormality. Age-indeterminate right nasal bone fracture ?CTA head & neck occlusive thrombus in proximal-mid left MCA M1 segment, moderate to severe stenosis of origin of left vertebral artery ?CT perfusion 150 cc penumbra, no core ?Post IR CT no hemorrhage ?MRI  pending ?2D Echo EF 55-60%, interatrial septum  not well visualized ?LDL 65 ?HgbA1c 7.9 ?VTE prophylaxis - SCDs ?   ?Diet  ? Diet full liquid Room service appropriate? Yes with Assist; Fluid consistency: Thin  ? ?Eliquis (apixaban) daily with unclear compliance prior to admission, now on aspirin 81 mg daily and clopidogrel 75 mg daily.  ?Therapy recommendations:  pending ?Disposition:  pending ? ?Hypertension ?Home meds:  none ?Unstable, requiring cleviprex ? amlodipine 10 mg daily, hydralazine 50mg  q8hrs, zestril 10mg  daily ?Keep SBP 120-160  ?Long-term BP goal normotensive ? ?Hyperlipidemia ?Home meds:  pravastatin 20 mg daily, restarted in hospital ?LDL 65, goal < 70 ?High intensity statin not indicated as LDL below goal ?Continue statin at discharge ? ?Diabetes type II Uncontrolled ?Home meds:  glipzide 10 mg daily ?HgbA1c 7.9, goal < 7.0 ?CBGs ?Recent Labs  ?  05/20/21 ?2334 05/21/21 ?[id number] 05/21/21 ?0820  ?GLUCAP 101* 168* 193*  ? ?  ?SSI ? ?HIV infection ?Restart home [place]  ? ?History of atrial fibrillation ?History of atrial fibrillation ?On home Eliquis, unclear compliance ?Cardizem 30 mg q8h ?Consider restarting in a few days ? ?Respiratory Failure ?Left intubated after thrombectomy ?Ventilator management per CCM ?Will attempt extubation today ? ?Other Stroke Risk Factors ?Advanced Age >/= 70  ?Cigarette smoker will advise to stop smoking ?Substance abuse - UDS:  THC NONE DETECTED, Cocaine POSITIVE. Patient advised to stop using due to stroke risk. ?Coronary artery disease ? ?Other Active Problems ? ? ?Hospital day # 1 ? ?ATTENDING ATTESTATION: ? ?71 year old with left M1 occlusion status post IR TICI 3 history of atrial fibrillation noncompliance with Eliquis and cocaine positive.    He is on hydralazine, Zestril and Norvasc.  Cleviprex drip stopped this morning. As needed's blood pressure medications are on board.  He is extubated yesterday and doing well from a respiratory standpoint.  Possible transfer out of the ICU tomorrow.  Asked the nurse to  DC left arterial femoral line.  Speech evaluated today planning on full liquid diet hold meds with pur?e.  On examination has left gaze preference but can cross midline.  Right hemiparesis, no aphasia. ? ?Dr. Reeves Forth evaluated pt independently, reviewed imaging, chart, labs. Discussed and formulated plan with the APP. Please see APP note above for details.    ?  ?This patient is critically ill due to respiratory distress, stroke s/p IR procedure, afib and at significant risk of neurological worsening, death form heart failure, respiratory failure, recurrent stroke, bleeding from [facility], seizure, sepsis. This patient's care requires constant monitoring of vital signs, hemodynamics, respiratory and cardiac monitoring, review of multiple databases,  neurological assessment, discussion with family, other specialists and medical decision making of high complexity. I spent 35 minutes of neurocritical care time in the care of this patient.  ? ?Britnie Colville,MD  ?  ?To contact Stroke Continuity provider, please refer to [email or web address] ?After hours, contact General Neurology  ?

## 2021-05-21 NOTE — Progress Notes (Signed)
? ? ?Referring Physician(s) ?Sethi ? ?Supervising Physician: Julieanne Cotton ? ?Patient Status:  [facility] - In-pt ? ?Chief Complaint: ? ?Code Stroke ? ?Brief History: ? ?Anthony Ferguson is a 71 yo male who fell out of his chair yesterday. ? ?He was found to have a right sided facial droop.   ? ?He presented to [facility] and was found to have right sided weakness and speech impairments.   ? ?CT  head showed left hyperdense MCA sign.   ? ?CTA showed a left MCA M1 occlusion. ? ?He underwent complete revascularization of the left middle cerebral artery with 1 pass by Dr. Corliss Skains. ? ?Post CT of the brain demonstrates no intracranial hemorrhage. ? ?He is now extubated and is working with Speech Therapy. ? ?Subjective: ? ?Sitting up in bed. Currently working with speech therapy. ? ?Allergies: ?Hctz [hydrochlorothiazide] and Pork-derived products ? ?Medications: ?Prior to Admission medications   ?Medication Sig Start Date End Date Taking? Authorizing Provider  ?apixaban (ELIQUIS) 5 MG TABS tablet Take 1 tablet (5 mg total) by mouth 2 (two) times daily. ?Patient not taking: Reported on 12/17/2019 07/24/16   Langston Reusing, MD  ?Susanne Borders [phone] MG TABS tablet TAKE 1 TABLET EVERY DAY. 02/28/21   Cliffton Asters, MD  ?glipiZIDE (GLUCOTROL XL) 10 MG 24 hr tablet Take 1 tablet (10 mg total) by mouth daily with breakfast. ?Patient not taking: Reported on 05/21/2021 03/14/17   Cliffton Asters, MD  ?glucose blood test strip Use as instructed 03/14/17   Cliffton Asters, MD  ?omega-3 acid ethyl esters (LOVAZA) 1 g capsule Take 1 capsule by mouth 2 (two) times daily. ?Patient not taking: Reported on 12/17/2019 06/19/16   [provider]  ?pravastatin (PRAVACHOL) 20 MG tablet Take 1 tablet (20 mg total) by mouth daily. ?Patient not taking: Reported on 12/17/2019 03/07/19   Cliffton Asters, MD  ? ? ? ?Vital Signs: ?BP 122/75   Pulse 75   Temp 99.2 ?F (37.3 ?C) (Oral)   Resp 19   Ht 6\' 3"  (1.905 m)   Wt 160 lb 4.4 oz  (72.7 kg)   SpO2 98%   BMI 20.03 kg/m?  ? ?Physical Exam ?Constitutional:   ?   Comments: Awake  ?HENT:  ?   Head: Normocephalic and atraumatic.  ?Eyes:  ?   Pupils: Pupils are equal, round, and reactive to light.  ?Cardiovascular:  ?   Rate and Rhythm: Normal rate.  ?   Comments: Right CFA stick site ok, no hematoma. A-line remains in left CFA. Ok for RN to remove. ?Pulmonary:  ?   Effort: Pulmonary effort is normal. No respiratory distress.  ?Skin: ?   General: Skin is warm and dry.  ?Neurological:  ?   Mental Status: He is oriented to person, place, and time.  ? ? ?Imaging: ?CT ANGIO HEAD NECK W WO CM ? ?Result Date: 05/19/2021 ?CLINICAL DATA:  Initial evaluation for acute right-sided weakness. EXAM: CT ANGIOGRAPHY HEAD AND NECK TECHNIQUE: Multidetector CT imaging of the head and neck was performed using the standard protocol during bolus administration of intravenous contrast. Multiplanar CT image reconstructions and MIPs were obtained to evaluate the vascular anatomy. Carotid stenosis measurements (when applicable) are obtained utilizing NASCET criteria, using the distal internal carotid diameter as the denominator. RADIATION DOSE REDUCTION: This exam was performed according to the departmental dose-optimization program which includes automated exposure control, adjustment of the mA and/or kV according to patient size and/or use of iterative reconstruction technique. CONTRAST:  OMNIPAQUE  IOHEXOL 350 MG/ML SOLN COMPARISON:  Prior head CT from earlier the same day. FINDINGS: CTA NECK FINDINGS Aortic arch: Visualized aortic arch normal caliber with normal branch pattern. Mild-to-moderate atheromatous change about the arch itself. No hemodynamically significant stenosis about the origin of the great vessels. Right carotid system: Scattered atheromatous plaque within the right common and internal carotid arteries without significant stenosis or dissection. Left carotid system: Scattered atheromatous plaque  within the left common and internal carotid arteries without significant stenosis or evidence for dissection. Vertebral arteries: Both vertebral arteries arise from subclavian arteries. No proximal subclavian artery stenosis. Atheromatous plaque at the origin of the left vertebral artery with associated moderate to severe stenosis. Right vertebral artery slightly dominant. Vertebral arteries otherwise patent distally without stenosis or dissection. Skeleton: No discrete or worrisome osseous lesions. Patient is edentulous. Prominent bulky anterior osteophytic spurring noted throughout the cervical spine. Other neck: No other acute soft tissue abnormality within the neck. Layering secretions noted within the hypopharynx. Upper chest: Visualized upper chest demonstrates no acute finding. Review of the MIP images confirms the above findings CTA HEAD FINDINGS Anterior circulation: Petrous segments patent bilaterally. Scattered atheromatous plaque within the carotid siphons with associated mild to moderate narrowing. A1 segments patent bilaterally. Normal anterior communicating artery complex. Anterior cerebral arteries patent without stenosis. Right M1 segment. No proximal right MCA branch occlusion. Distal right MCA branches well perfused. On the left, there is occlusive versus subocclusive thrombus within the proximal-mid left M1 segment (series 6, image 101). Attenuated flow within the left MCA branches distally could be collateral nature or possibly related to subocclusive thrombus. Decreased attenuation seen throughout the left MCA territory as compared to the right. Posterior circulation: Both V4 segments patent without significant stenosis. Right PICA patent. Left PICA not seen. Basilar patent to its distal aspect without stenosis. Superior cerebellar and posterior cerebral arteries patent bilaterally. Venous sinuses: Grossly patent allowing for timing the contrast bolus. Anatomic variants: None significant.  Review of the MIP images confirms the above findings IMPRESSION: 1. Positive CTA with occlusive thrombus within the proximal-mid left M1 segment. Attenuated flow within the left MCA branches distally could be collateral in nature or possibly related to subocclusive thrombus. 2. Atheromatous change about the carotid siphons with associated mild to moderate multifocal narrowing. No hemodynamically significant stenosis about either carotid artery system within the neck. 3. Moderate to severe stenosis at the origin of the left vertebral artery. Critical Value/emergent results were called by telephone at the time of interpretation on 05/19/2021 at 11:08 pm to provider MARGAUX VENTER , who verbally acknowledged these results. Electronically Signed   By: Jeannine Boga M.D.   On: 05/19/2021 23:22  ? ?CT Head Wo Contrast ? ?Result Date: 05/19/2021 ?CLINICAL DATA:  head injury. intoxicated; facial trauma EXAM: CT HEAD WITHOUT CONTRAST CT MAXILLOFACIAL WITHOUT CONTRAST TECHNIQUE: Multidetector CT imaging of the head and maxillofacial structures were performed using the standard protocol without intravenous contrast. Multiplanar CT image reconstructions of the maxillofacial structures were also generated. RADIATION DOSE REDUCTION: This exam was performed according to the departmental dose-optimization program which includes automated exposure control, adjustment of the mA and/or kV according to patient size and/or use of iterative reconstruction technique. COMPARISON:  None. FINDINGS: CT HEAD FINDINGS BRAIN: BRAIN Patchy and confluent areas of decreased attenuation are noted throughout the deep and periventricular white matter of the cerebral hemispheres bilaterally, compatible with chronic microvascular ischemic disease. No evidence of large-territorial acute infarction. No parenchymal hemorrhage. No mass lesion. No extra-axial collection. No  mass effect or midline shift. No hydrocephalus. Basilar cisterns are patent.  Vascular: No hyperdense vessel. Atherosclerotic calcifications are present within the cavernous internal carotid arteries. Skull: No acute fracture or focal lesion. Other: None. CT MAXILLOFACIAL FINDINGS Osseous: Age-in

## 2021-05-22 DIAGNOSIS — I63512 Cerebral infarction due to unspecified occlusion or stenosis of left middle cerebral artery: Secondary | ICD-10-CM | POA: Diagnosis not present

## 2021-05-22 LAB — GLUCOSE, CAPILLARY
Glucose-Capillary: 147 mg/dL — ABNORMAL HIGH (ref 70–99)
Glucose-Capillary: 169 mg/dL — ABNORMAL HIGH (ref 70–99)
Glucose-Capillary: 176 mg/dL — ABNORMAL HIGH (ref 70–99)
Glucose-Capillary: 209 mg/dL — ABNORMAL HIGH (ref 70–99)
Glucose-Capillary: 210 mg/dL — ABNORMAL HIGH (ref 70–99)
Glucose-Capillary: 218 mg/dL — ABNORMAL HIGH (ref 70–99)
Glucose-Capillary: 251 mg/dL — ABNORMAL HIGH (ref 70–99)

## 2021-05-22 LAB — CBC
HCT: 38.6 % — ABNORMAL LOW (ref 39.0–52.0)
Hemoglobin: 13.2 g/dL (ref 13.0–17.0)
MCH: 30.6 pg (ref 26.0–34.0)
MCHC: 34.2 g/dL (ref 30.0–36.0)
MCV: 89.6 fL (ref 80.0–100.0)
Platelets: 129 10*3/uL — ABNORMAL LOW (ref 150–400)
RBC: 4.31 MIL/uL (ref 4.22–5.81)
RDW: 13.9 % (ref 11.5–15.5)
WBC: 5.3 10*3/uL (ref 4.0–10.5)
nRBC: 0 % (ref 0.0–0.2)

## 2021-05-22 LAB — BASIC METABOLIC PANEL
Anion gap: 6 (ref 5–15)
BUN: 9 mg/dL (ref 8–23)
CO2: 25 mmol/L (ref 22–32)
Calcium: 8.8 mg/dL — ABNORMAL LOW (ref 8.9–10.3)
Chloride: 104 mmol/L (ref 98–111)
Creatinine, Ser: 0.92 mg/dL (ref 0.61–1.24)
GFR, Estimated: >60 mL/min (ref >60)
Glucose, Bld: 198 mg/dL — ABNORMAL HIGH (ref 70–99)
Potassium: 3.9 mmol/L (ref 3.5–5.1)
Sodium: 135 mmol/L (ref 135–145)

## 2021-05-22 LAB — MAGNESIUM
Magnesium: 1.9 mg/dL (ref 1.7–2.4)
Magnesium: 2 mg/dL (ref 1.7–2.4)

## 2021-05-22 LAB — PHOSPHORUS
Phosphorus: 3.1 mg/dL (ref 2.5–4.6)
Phosphorus: 3.3 mg/dL (ref 2.5–4.6)

## 2021-05-22 MED ORDER — CHLORHEXIDINE GLUCONATE 0.12 % MT SOLN
15.0000 mL | Freq: Two times a day (BID) | OROMUCOSAL | Status: DC
  Administered 2021-05-22 – 2021-05-25 (×7): 15 mL via OROMUCOSAL
  Filled 2021-05-22 (×8): qty 15

## 2021-05-22 MED ORDER — INSULIN ASPART 100 UNIT/ML IJ SOLN
3.0000 [IU] | INTRAMUSCULAR | Status: DC
  Administered 2021-05-22 – 2021-05-24 (×10): 3 [IU] via SUBCUTANEOUS

## 2021-05-22 MED ORDER — ORAL CARE MOUTH RINSE
15.0000 mL | Freq: Two times a day (BID) | OROMUCOSAL | Status: DC
  Administered 2021-05-22 – 2021-05-25 (×8): 15 mL via OROMUCOSAL

## 2021-05-22 NOTE — Progress Notes (Signed)
Inpatient Rehab Admissions Coordinator Note:  ? ?Per PT patient was screened for CIR candidacy by Mansa Willers Luvenia Starch, CCC-SLP. At this time, pt appears to be a potential candidate for CIR. I will place an order for rehab consult for full assessment, per our protocol.  Please contact me any with questions.. ? ? ?Wolfgang Phoenix, MS, CCC-SLP ?Admissions Coordinator ?[phone] ?05/22/21 ?4:21 PM ? ?

## 2021-05-22 NOTE — Progress Notes (Addendum)
STROKE TEAM PROGRESS NOTE  ? ?INTERVAL HISTORY ?Patient is seen in his room with no family at the bedside.  He has had no acute events overnight.  MRI shows acute infarcts in left corona radiata and basal ganglia with hemorrhage in posterior left basal ganglia.   He is doing well and will be transferred out of the ICU today. ? ?Vitals:  ? 05/22/21 0800 05/22/21 0900 05/22/21 1000 05/22/21 1157  ?BP: 115/76 139/73 120/74   ?Pulse: (!) 49 (!) 54 (!) 58   ?Resp: (!) 27 20 20    ?Temp: 97.6 ?F (36.4 ?C)   97.6 ?F (36.4 ?C)  ?TempSrc: Axillary   Axillary  ?SpO2: 97% 98% 100%   ?Weight:      ?Height:      ? ?CBC:  ?Recent Labs  ?Lab 05/19/21 ?2208 05/20/21 ?0228 05/21/21 ?[id number] 05/22/21 ?0435  ?WBC 3.3* 2.8* 6.3 5.3  ?NEUTROABS 2.2 1.8  --   --   ?HGB 13.6 12.6* 12.7* 13.2  ?HCT 41.0 36.1* 36.3* 38.6*  ?MCV 91.7 89.8 89.0 89.6  ?PLT 134* 126* 131* 129*  ? ? ?Basic Metabolic Panel:  ?Recent Labs  ?Lab 05/21/21 ?0619 05/21/21 ?1753 05/22/21 ?0435  ?NA 135  --  135  ?K 3.6  --  3.9  ?CL 107  --  104  ?CO2 21*  --  25  ?GLUCOSE 196*  --  198*  ?BUN 10  --  9  ?CREATININE 0.93  --  0.92  ?CALCIUM 8.9  --  8.8*  ?MG 2.0 1.9 1.9  ?PHOS 2.6 3.1 3.1  ? ? ?Lipid Panel:  ?Recent Labs  ?Lab 05/20/21 ?0228  ?CHOL 141  ?TRIG 37  ?HDL 69  ?CHOLHDL 2.0  ?VLDL 7  ?[place] 65  ? ? ?HgbA1c:  ?Recent Labs  ?Lab 05/20/21 ?0228  ?HGBA1C 7.9*  ? ? ?Urine Drug Screen:  ?Recent Labs  ?Lab 05/19/21 ?2217  ?LABOPIA NONE DETECTED  ?COCAINSCRNUR POSITIVE*  ?LABBENZ NONE DETECTED  ?AMPHETMU NONE DETECTED  ?THCU NONE DETECTED  ?LABBARB NONE DETECTED  ? ?  ?Alcohol Level  ?Recent Labs  ?Lab 05/19/21 ?2208  ?ETH <10  ? ? ? ?IMAGING past 24 hours ?MR BRAIN WO CONTRAST ? ?Result Date: 05/21/2021 ?CLINICAL DATA:  Stroke suspected, left M1 occlusion, status post thrombectomy EXAM: MRI HEAD WITHOUT CONTRAST TECHNIQUE: Multiplanar, multiecho pulse sequences of the brain and surrounding structures were obtained without intravenous contrast. COMPARISON:  CT head,  CTA head and neck, and CT perfusion 05/20/2021 FINDINGS: Brain: Restricted diffusion in the left MCA territory, with the largest areas in the left corona radiata (series 2, image 33) and basal ganglia (series 2, image 29), as well as additional more punctate areas in the left frontal lobe (series 2, images 36-38), inferior left frontal lobe (series 2, image 30), insula (series 2, image 26), and posterior left temporal lobe (series 2, image 25-26). These are in addition to areas of hemorrhage, most prominently in the posterior left basal ganglia, extending into the left anterior hippocampus (series 7, images 45-56), which measures approximately 1.3 x 1.0 x 1.9 cm (series 5, image 13 and series 9, image 17), although this is likely underestimating the size; additional smaller areas of hemorrhage in the posterior left caudate body (series 7, image 62-65) and left posterior frontal white matter (series 7, image 66). These areas are associated with mild surrounding edema. No significant mass effect or midline shift. No other acute hemorrhage. No mass, hydrocephalus, or extra-axial collection. T2 hyperintense signal in  the periventricular white matter, likely the sequela of chronic small vessel ischemic disease. Vascular: Normal flow voids. In particular, the left M1 flow void is seen. Skull and upper cervical spine: Normal marrow signal. Sinuses/Orbits: Negative. Other: The mastoids are well aerated. IMPRESSION: 1. Acute infarcts in the left corona radiata and basal ganglia, consistent with the prior left M1 occlusion. Additional punctate areas in the left frontal lobe and temporal lobe are favored to be embolic. 2. Hemorrhage in the posterior left basal ganglia, extending into the left anterior hippocampus, as well as in the posterior left caudate and left posterior frontal white matter, with mild surrounding edema. No significant mass effect or midline shift. Electronically Signed   By: Merilyn Baba M.D.   On:  05/21/2021 15:34   ? ?PHYSICAL EXAM ?General:  Thin-appearing African American patient in no acute distress.   ?Respiratory:  Respirations regular and unlabored ? ? ?NEURO:  ?Mental Status: AA&Ox2 ?Speech/Language: speech is without dysarthria or aphasia.  Naming, fluency, and comprehension intact. ? ?Cranial Nerves:  ?II: PERRL. Visual fields full, but right sided visual neglect present ?III, IV, VI: EOMI. Eyelids elevate symmetrically. Left gaze preference but can cross midline ?V: Sensation is intact to light touch and symmetrical to face.  ?VII: Smile is symmetrical.  ?VIII: hearing intact to voice. ?IX, X: Phonation is normal.  ?XII: tongue is midline without fasciculations. ?Motor: 5/5 strength to LUE and LLE, 0/5 to RUE, 2/5 to RLE  ?Tone: is normal and bulk is normal ?Sensation- Intact to light touch bilaterally.  ?Gait- deferred ? ? ?ASSESSMENT/PLAN ?[name] is a 71 y.o. male with history of HTN, HIV, HLD,  DM2 and Eliquis use for atrial fibrillation presenting after he fell out of his chair while drinking outside and noticed a right sided facial droop.  He then presented to [facility] and was found to have right sided weakness and speech impairments.  CAT showed left hyperdense MCA sign.  Patient was found to lave a left MCA M1 occlusion and was taken for mechanical thrombectomy.  This was successful with TICI 3 flow achieved.  He was extubated on 3/31.  MRI 4/1 shows acute infarcts in left corona radiata, punctate infarcts in left frontal lobe and left temporal lobe and basal ganglia with hemorrhage in posterior left basal ganglia. ? ?Stroke:  left MCA M1 branch infarct likely secondary due to embolism in setting of known atrial fibrillation and unclear compliance with Eliquis as well as cocaine abuse s/p thrombectomy with TICI 3 flow achieved ?CT head No acute abnormality. Age-indeterminate right nasal bone fracture ?CTA head & neck occlusive thrombus in proximal-mid left MCA M1  segment, moderate to severe stenosis of origin of left vertebral artery ?CT perfusion 150 cc penumbra, no core ?Post IR CT no hemorrhage ?MRI  acute infarcts in left corona radiata, punctate infarcts in left frontal lobe and left temporal lobe and basal ganglia with hemorrhage in posterior left basal ganglia ?2D Echo EF 55-60%, interatrial septum not well visualized ?LDL 65 ?HgbA1c 7.9 ?VTE prophylaxis - SCDs ?   ?Diet  ? Diet full liquid Room service appropriate? Yes with Assist; Fluid consistency: Thin  ? ?Eliquis (apixaban) daily with unclear compliance prior to admission, now on No antithrombotic secondary to hemorrhage.  ?Therapy recommendations:  pending ?Disposition:  pending ? ?Hypertension ?Home meds:  none ?Stable, no longer requiring cleviprex ? amlodipine 10 mg daily, hydralazine 50mg  q8hrs, zestril 10mg  daily ?Keep SBP 120-160  ?Long-term BP goal normotensive ? ?  Hyperlipidemia ?Home meds:  pravastatin 20 mg daily, restarted in hospital ?LDL 65, goal < 70 ?High intensity statin not indicated as LDL below goal ?Continue statin at discharge ? ?Diabetes type II Uncontrolled ?Home meds:  glipzide 10 mg daily ?HgbA1c 7.9, goal < 7.0 ?CBGs ?Recent Labs  ?  05/22/21 ?[id number] 05/22/21 ?[id number] 05/22/21 ?1114  ?GLUCAP 176* 218* 169*  ? ?  ?SSI ? ?HIV infection ?On home [place]  ? ?History of atrial fibrillation ?History of atrial fibrillation ?On home Eliquis, unclear compliance ?Cardizem 30 mg q8h ?Consider restarting when hemorrhage resolved ? ?Respiratory Failure ?Left intubated after thrombectomy ?Ventilator management per CCM ?Extubated 3/31 ? ?Other Stroke Risk Factors ?Advanced Age >/= 82  ?Cigarette smoker will advise to stop smoking ?Substance abuse - UDS:  THC NONE DETECTED, Cocaine POSITIVE. Patient advised to stop using due to stroke risk. ?Coronary artery disease ? ?Other Active Problems ? ? ?Hospital day # 7 ? ?71 year old with left M1 occlusion status post IR TICI 3 history of atrial fibrillation  noncompliance with Eliquis and cocaine positive.    On examination has left gaze preference but can cross midline.  Right hemiparesis, able to follow commands. Transfer out of ICU today. Antiplatelet held due to BG hem

## 2021-05-22 NOTE — Plan of Care (Signed)
  Problem: Nutrition: Goal: Risk of aspiration will decrease Outcome: Progressing Goal: Dietary intake will improve Outcome: Progressing   Problem: Ischemic Stroke/TIA Tissue Perfusion: Goal: Complications of ischemic stroke/TIA will be minimized Outcome: Progressing   

## 2021-05-22 NOTE — Evaluation (Signed)
Physical Therapy Evaluation ?Patient Details ?Name: Anthony Ferguson ?MRN: [id number] ?DOB: [birth year] ?Today's Date: 05/22/2021 ? ?History of Present Illness ? 71 y.o. male presented to [facility] ED 3/31 with s/s stroke. CT head revealed left MCA M1 occlusion. Pt transfered to [facility] for emergent mechanical thrombectomy. Intubated for procedure, extubated same day.  PMH HIV, HTN, hypercholesterolemia diabetes type 2, + cocaine use. MRI positive for acute infarcts in left corona radiata and basal ganglia with hemorrhage in posterior left basal ganglia.  ?Clinical Impression ? Patient presents with decreased mobility due to generalized weakness, R hemiparesis and inattention and decreased balance leaning to R in addition to decreased cognition.  Previously lived alone in second floor apartment with elevator access getting rides from friends.  States his daughter and son can assist at d/c.  Feel he will benefit from skilled PT in the acute setting and from follow up acute inpatient rehab stay prior to d/c with family support.    ?   ? ?Recommendations for follow up therapy are one component of a multi-disciplinary discharge planning process, led by the attending physician.  Recommendations may be updated based on patient status, additional functional criteria and insurance authorization. ? ?Follow Up Recommendations Acute inpatient rehab (3hours/day) ? ?  ?Assistance Recommended at Discharge Frequent or constant Supervision/Assistance  ?Patient can return home with the following ? Two people to help with walking and/or transfers;Two people to help with bathing/dressing/bathroom;Assistance with cooking/housework;Direct supervision/assist for financial management;Help with stairs or ramp for entrance;Direct supervision/assist for medications management;Assist for transportation ? ?  ?Equipment Recommendations Other (comment) (TBA)  ?Recommendations for Other Services ? Rehab consult  ?  ?Functional Status Assessment Patient has had  a recent decline in their functional status and demonstrates the ability to make significant improvements in function in a reasonable and predictable amount of time.  ? ?  ?Precautions / Restrictions Precautions ?Precautions: Fall ?Precaution Comments: decreased R side awareness, R HP  ? ?  ? ?Mobility ? Bed Mobility ?Overal bed mobility: Needs Assistance ?Bed Mobility: Rolling, Sidelying to Sit ?Rolling: Mod assist ?Sidelying to sit: Max assist, HOB elevated ?  ?  ?  ?General bed mobility comments: cues for technique and guided hand to R side of bed due to R side inattention; side to sit help for R leg off bed and trunk upright ?  ? ?Transfers ?Overall transfer level: Needs assistance ?  ?Transfers: Sit to/from Stand, Bed to chair/wheelchair/BSC ?Sit to Stand: Max assist ?  ?  ?Squat pivot transfers: Max assist ?  ?  ?General transfer comment: assist to stand from EOB unable to achieve full upright with R LE flexed throughout; to pivot to recliner cues to reach with L hand to opposite armrest and assist for pivoting to recliner ?  ? ?Ambulation/Gait ?  ?  ?  ?  ?  ?  ?  ?  ? ?Stairs ?  ?  ?  ?  ?  ? ?Wheelchair Mobility ?  ? ?Modified Rankin (Stroke Patients Only) ?Modified Rankin (Stroke Patients Only) ?Pre-Morbid Rankin Score: No symptoms ?Modified Rankin: Severe disability ? ?  ? ?Balance Overall balance assessment: Needs assistance ?Sitting-balance support: Feet supported ?Sitting balance-Leahy Scale: Poor ?Sitting balance - Comments: falls to R; can sit with close S briefly when pt holding footboard ?Postural control: Right lateral lean ?Standing balance support: Bilateral upper extremity supported ?Standing balance-Leahy Scale: Zero ?Standing balance comment: R knee flexed in stance unable to achieve full upright with max A ?  ?  ?  ?  ?  ?  ?  ?  ?  ?  ?  ?   ? ? ? ?  Pertinent Vitals/Pain Pain Assessment ?Pain Assessment: No/denies pain  ? ? ?Home Living Family/patient expects to be discharged to:: Private  residence ?Living Arrangements: Alone ?Available Help at Discharge: Family;Available 24 hours/day (?? states daughter, son could assist) ?Type of Home: Apartment (second floor) ?Home Access: Elevator ?  ?  ?  ?Home Layout: One level ?Home Equipment: None ?   ?  ?Prior Function Prior Level of Function : Independent/Modified Independent ?  ?  ?  ?  ?  ?  ?Mobility Comments: help for transportation from friends ?  ?  ? ? ?Hand Dominance  ? Dominant Hand: Left ? ?  ?Extremity/Trunk Assessment  ? Upper Extremity Assessment ?Upper Extremity Assessment: RUE deficits/detail ?RUE Deficits / Details: PROM WFL, strength some activation with gravity eliminated going towards his face with max cues for attention ?RUE Sensation: decreased light touch ?RUE Coordination: decreased gross motor;decreased fine motor ?  ? ?Lower Extremity Assessment ?Lower Extremity Assessment: RLE deficits/detail ?RLE Deficits / Details: difficult to activate in bed, some weight acceptance in standing, but buckles quickly ?RLE Sensation: decreased light touch ?RLE Coordination: decreased gross motor;decreased fine motor ?  ? ?   ?Communication  ? Communication: Expressive difficulties  ?Cognition Arousal/Alertness: Awake/alert ?Behavior During Therapy: Flat affect ?Overall Cognitive Status: Impaired/Different from baseline ?Area of Impairment: Orientation, Attention, Following commands, Problem solving ?  ?  ?  ?  ?  ?  ?  ?  ?Orientation Level: Disoriented to, Place, Time ?Current Attention Level: Sustained ?  ?Following Commands: Follows one step commands consistently, Follows one step commands with increased time ?  ?  ?Problem Solving: Slow processing, Decreased initiation, Requires verbal cues, Requires tactile cues ?General Comments: states at [facility], March 2nd: R inattention ?  ?  ? ?  ?General Comments General comments (skin integrity, edema, etc.): Encouraged finding his R hand with L and managing it if caught somewhere ? ?  ?Exercises     ? ?Assessment/Plan  ?  ?PT Assessment Patient needs continued PT services  ?PT Problem List Decreased strength;Decreased mobility;Decreased safety awareness;Decreased balance;Decreased knowledge of use of DME;Impaired sensation;Decreased cognition;Decreased activity tolerance;Decreased coordination;Decreased knowledge of precautions ? ?   ?  ?PT Treatment Interventions DME instruction;Therapeutic activities;Cognitive remediation;Patient/family education;Therapeutic exercise;Gait training;Stair training;Balance training;Functional mobility training   ? ?PT Goals (Current goals can be found in the Care Plan section)  ?Acute Rehab PT Goals ?Patient Stated Goal: to get stronger ?PT Goal Formulation: With patient ?Time For Goal Achievement: 06/05/21 ?Potential to Achieve Goals: Good ? ?  ?Frequency Min 4X/week ?  ? ? ?Co-evaluation   ?  ?  ?  ?  ? ? ?  ?AM-PAC PT "6 Clicks" Mobility  ?Outcome Measure Help needed turning from your back to your side while in a flat bed without using bedrails?: A Lot ?Help needed moving from lying on your back to sitting on the side of a flat bed without using bedrails?: Total ?Help needed moving to and from a bed to a chair (including a wheelchair)?: Total ?Help needed standing up from a chair using your arms (e.g., wheelchair or bedside chair)?: Total ?Help needed to walk in hospital room?: Total ?Help needed climbing 3-5 steps with a railing? : Total ?6 Click Score: 7 ? ?  ?End of Session Equipment Utilized During Treatment: Gait belt ?Activity Tolerance: Patient tolerated treatment well ?Patient left: in chair;with call bell/phone within reach ?Nurse Communication: Mobility status ?PT Visit Diagnosis: Other abnormalities of gait and mobility (R26.89);Hemiplegia and hemiparesis;Other symptoms and  signs involving the nervous system (R29.898) ?Hemiplegia - Right/Left: Right ?Hemiplegia - dominant/non-dominant: Non-dominant ?Hemiplegia - caused by: Cerebral infarction ?  ? ?Time:  [id number] ?PT Time Calculation (min) (ACUTE ONLY): 30 min ? ? ?Charges:   PT Evaluation ?$PT Eval Moderate Complexity: 1 Mod ?PT Treatments ?$Therapeutic Activity: 8-22 mins ?  ?   ? ? ?Magda Kiel, PT ?Acute Re

## 2021-05-22 NOTE — Progress Notes (Signed)
Inpatient Diabetes Program Recommendations ? ?AACE/ADA: New Consensus Statement on Inpatient Glycemic Control (2015) ? ?Target Ranges:  Prepandial:   less than 140 mg/dL ?     Peak postprandial:   less than 180 mg/dL (1-2 hours) ?     Critically ill patients:  140 - 180 mg/dL  ? ? Latest Reference Range & Units 05/21/21 08:20 05/21/21 12:11 05/21/21 16:34 05/21/21 20:30 05/22/21 00:10 05/22/21 03:40 05/22/21 08:11 05/22/21 11:14  ?Glucose-Capillary 70 - 99 mg/dL 193 (H) ? ?2 units Novolog 166 (H) ? ?2 units Novolog 151 (H) ? ?2 units Novolog 218 (H) ? ?3 units Novolog 210 (H) ? ?3 units Novolog 176 (H) ? ?2 units Novolog 218 (H) ? ?3 units Novolog ? 169 (H) ? ?2 units Novolog   ? ? Latest Reference Range & Units 05/20/21 02:28  ?Hemoglobin A1C 4.8 - 5.6 % 7.9 (H)  ?(H): Data is abnormally high ? ?Admit with: Acute L MCA stroke with L MCA M1 occlusion undergoing thrombectomy ? ?History: DM ? ?Home DM Meds: Glipizide 10 mg daily (cuts pills into 1/4 pieces per Home Med Rec) ? ?Current Orders: Novolog Sensitive Correction Scale/ SSI (0-9 units) Q4 hours ? ? ? ? ?Pt allowed FL diet + Getting Vital tube feeds at 55cc/hr (goal rate 65 cc/hr) ? ? ? ?MD- Once tube feeds are at goal rate of 65 cc/hr, please consider starting Novolog Tube Feed Coverage: ? ?Novolog 3 units Q4H ?Give with SSI dose ?HOLD if tube feeds HELD for any reason ? ? ? ?--Will follow patient during hospitalization-- ? ?Wyn Quaker RN, MSN, CDE ?Diabetes Coordinator ?Inpatient Glycemic Control Team ?Team Pager: [phone] (8a-5p) ? ? ?

## 2021-05-23 DIAGNOSIS — I63512 Cerebral infarction due to unspecified occlusion or stenosis of left middle cerebral artery: Secondary | ICD-10-CM | POA: Diagnosis not present

## 2021-05-23 LAB — BASIC METABOLIC PANEL
Anion gap: 9 (ref 5–15)
BUN: 13 mg/dL (ref 8–23)
CO2: 21 mmol/L — ABNORMAL LOW (ref 22–32)
Calcium: 8.8 mg/dL — ABNORMAL LOW (ref 8.9–10.3)
Chloride: 103 mmol/L (ref 98–111)
Creatinine, Ser: 0.8 mg/dL (ref 0.61–1.24)
GFR, Estimated: >60 mL/min (ref >60)
Glucose, Bld: 112 mg/dL — ABNORMAL HIGH (ref 70–99)
Potassium: 3.9 mmol/L (ref 3.5–5.1)
Sodium: 133 mmol/L — ABNORMAL LOW (ref 135–145)

## 2021-05-23 LAB — CBC
HCT: 39.3 % (ref 39.0–52.0)
Hemoglobin: 13.7 g/dL (ref 13.0–17.0)
MCH: 31.2 pg (ref 26.0–34.0)
MCHC: 34.9 g/dL (ref 30.0–36.0)
MCV: 89.5 fL (ref 80.0–100.0)
Platelets: 125 10*3/uL — ABNORMAL LOW (ref 150–400)
RBC: 4.39 MIL/uL (ref 4.22–5.81)
RDW: 13.8 % (ref 11.5–15.5)
WBC: 5.3 10*3/uL (ref 4.0–10.5)
nRBC: 0 % (ref 0.0–0.2)

## 2021-05-23 LAB — GLUCOSE, CAPILLARY
Glucose-Capillary: 150 mg/dL — ABNORMAL HIGH (ref 70–99)
Glucose-Capillary: 175 mg/dL — ABNORMAL HIGH (ref 70–99)
Glucose-Capillary: 196 mg/dL — ABNORMAL HIGH (ref 70–99)
Glucose-Capillary: 185 mg/dL — ABNORMAL HIGH (ref 70–99)
Glucose-Capillary: 176 mg/dL — ABNORMAL HIGH (ref 70–99)

## 2021-05-23 MED ORDER — ASPIRIN 81 MG PO CHEW
81.0000 mg | CHEWABLE_TABLET | Freq: Every day | ORAL | Status: DC
  Administered 2021-05-23: 81 mg
  Filled 2021-05-23 (×2): qty 1

## 2021-05-23 MED ORDER — ASPIRIN EC 81 MG PO TBEC
81.0000 mg | DELAYED_RELEASE_TABLET | Freq: Every day | ORAL | Status: DC
  Filled 2021-05-23: qty 1

## 2021-05-23 NOTE — Progress Notes (Signed)
Physical Therapy Treatment ?Patient Details ?Name: Anthony Ferguson ?MRN: [id number] ?DOB: 17-Dec-1950 ?Today's Date: 05/23/2021 ? ? ?History of Present Illness Pt is 71 y.o. male presented to [facility] ED 3/31 with s/s stroke. CT head revealed left MCA M1 occlusion. Pt transfered to [facility] for emergent mechanical thrombectomy. Intubated for procedure, extubated same day.  PMH HIV, HTN, hypercholesterolemia diabetes type 2, + cocaine use. MRI positive for acute infarcts in left corona radiata and basal ganglia with hemorrhage in posterior left basal ganglia. ? ?  ?PT Comments  ? ? Pt making gradual improvement.  Improved EOB balance with time and cues.  Still with significant R sided weakness requiring assist of 2 for transfers and unable to take steps.  Pt pleasant, follows commands, does well participating with therapy. Continue to progress as able.   ?Recommendations for follow up therapy are one component of a multi-disciplinary discharge planning process, led by the attending physician.  Recommendations may be updated based on patient status, additional functional criteria and insurance authorization. ? ?Follow Up Recommendations ? Acute inpatient rehab (3hours/day) ?  ?  ?Assistance Recommended at Discharge Frequent or constant Supervision/Assistance  ?Patient can return home with the following Two people to help with walking and/or transfers;Two people to help with bathing/dressing/bathroom;Assistance with cooking/housework;Direct supervision/assist for financial management;Help with stairs or ramp for entrance;Direct supervision/assist for medications management;Assist for transportation ?  ?Equipment Recommendations ? Other (comment) (defer to post acute)  ?  ?Recommendations for Other Services   ? ? ?  ?Precautions / Restrictions Precautions ?Precautions: Fall ?Precaution Comments: R inattention; cortrak  ?  ? ?Mobility ? Bed Mobility ?Overal bed mobility: Needs Assistance ?Bed Mobility: Supine to Sit ?  ?  ?Supine to  sit: Mod assist, +2 for safety/equipment ?  ?  ?General bed mobility comments: Moved to R side of bed requiring cues and assist for R LE and to lift trunk ?  ? ?Transfers ?Overall transfer level: Needs assistance ?Equipment used: 2 person hand held assist ?Transfers: Sit to/from Stand ?Sit to Stand: Mod assist, +2 physical assistance ?Stand pivot transfers: Max assist, +2 physical assistance ?  ?  ?  ?  ?General transfer comment: Stood with "3 muskateer" stance ; pivoted to chair with R LE blocked.  Attempted to take steps with assist to move legs but pt unable to weight shift on R LE to advance L LE ?  ? ?Ambulation/Gait ?  ?  ?  ?  ?  ?  ?  ?General Gait Details: unable ? ? ?Stairs ?  ?  ?  ?  ?  ? ? ?Wheelchair Mobility ?  ? ?Modified Rankin (Stroke Patients Only) ?Modified Rankin (Stroke Patients Only) ?Pre-Morbid Rankin Score: No symptoms ?Modified Rankin: Severe disability ? ? ?  ?Balance Overall balance assessment: Needs assistance ?Sitting-balance support: Feet supported, Single extremity supported ?Sitting balance-Leahy Scale: Poor ?Sitting balance - Comments: Pt initially leaning to the R requiring mod A and pushing.  Also, slight forward lean.  Pt could correct with cues and L UE support. Progressed to sitting with close supervision and cues briefly to min A.  Required mod A when challenged.  Sat EOB at least 5 mins. ?  ?  ?Standing balance-Leahy Scale: Zero ?Standing balance comment: Requiring mod A x 2 and cues to stand upright ?  ?  ?  ?  ?  ?  ?  ?  ?  ?  ?  ?  ? ?  ?Cognition Arousal/Alertness: Awake/alert ?Behavior During Therapy:  WFL for tasks assessed/performed ?Overall Cognitive Status: Impaired/Different from baseline ?Area of Impairment: Orientation, Attention, Following commands, Problem solving, Awareness ?  ?  ?  ?  ?  ?  ?  ?  ?Orientation Level: Disoriented to, Time ?Current Attention Level: Sustained ?  ?Following Commands: Follows one step commands consistently, Follows one step commands  with increased time ?  ?Awareness: Emergent ?Problem Solving: Slow processing, Decreased initiation, Requires verbal cues, Requires tactile cues ?General Comments: Aware he is admitted for CVA; States date January 2022; R inattention ?  ?  ? ?  ?Exercises General Exercises - Lower Extremity ?Ankle Circles/Pumps: PROM, Right, 15 reps, Supine (with tapping and verbal facilitation) ?Quad Sets: Both, 10 reps (Attempted but pt only able on L) ?Other Exercises ?Other Exercises: heel slide with mod A and then cues to push therapist away - pt able to minimally activate glutes to push ? ?  ?General Comments   ?  ?  ? ?Pertinent Vitals/Pain Pain Assessment ?Pain Assessment: No/denies pain  ? ? ?Home Living   ?  ?  ?  ?  ?  ?  ?  ?  ?  ?   ?  ?Prior Function    ?  ?  ?   ? ?PT Goals (current goals can now be found in the care plan section) Additional Goals ?Additional Goal #1: R LE strength to 3/5 to assist with transfers ?Progress towards PT goals: Progressing toward goals ? ?  ?Frequency ? ? ? Min 4X/week ? ? ? ?  ?PT Plan Current plan remains appropriate  ? ? ?Co-evaluation   ?  ?  ?  ?  ? ?  ?AM-PAC PT "6 Clicks" Mobility   ?Outcome Measure ? Help needed turning from your back to your side while in a flat bed without using bedrails?: A Lot ?Help needed moving from lying on your back to sitting on the side of a flat bed without using bedrails?: A Lot ?Help needed moving to and from a bed to a chair (including a wheelchair)?: Total ?Help needed standing up from a chair using your arms (e.g., wheelchair or bedside chair)?: Total ?Help needed to walk in hospital room?: Total ?Help needed climbing 3-5 steps with a railing? : Total ?6 Click Score: 8 ? ?  ?End of Session Equipment Utilized During Treatment: Gait belt ?Activity Tolerance: Patient tolerated treatment well ?Patient left: in chair;with call bell/phone within reach;with chair alarm set ?Nurse Communication: Mobility status ?PT Visit Diagnosis: Other abnormalities of  gait and mobility (R26.89);Hemiplegia and hemiparesis;Other symptoms and signs involving the nervous system (R29.898) ?Hemiplegia - Right/Left: Right ?Hemiplegia - dominant/non-dominant: Non-dominant ?Hemiplegia - caused by: Cerebral infarction ?  ? ? ?Time: [id number] ?PT Time Calculation (min) (ACUTE ONLY): 32 min ? ?Charges:  $Neuromuscular Re-education: 8-22 mins          ?          ? ?Abran Richard, PT ?Acute Rehab Services ?Pager [phone] ?[facility] [id number] ? ? ? ?Mikael Spray Orlinda Slomski ?05/23/2021, 12:11 PM ? ?

## 2021-05-23 NOTE — Progress Notes (Signed)
Inpatient Rehab Admissions Coordinator:  ? ?I met with pt at bedside to discuss goals/expectations of CIR stay. I reviewed 3 hrs/day of therapy, and likely need for 24/7 supervision assist at discharge.  Pt unsure whether family can provide this. I attempted to call daughter, but no answer and unable to leave message.  Will try again tomorrow.  Will need insurance auth and caregiver support confirmed prior to any potential admission.   ? ?Shann Medal, PT, DPT ?Admissions Coordinator ?[phone] ?05/23/21  ?3:07 PM ? ?

## 2021-05-23 NOTE — Evaluation (Signed)
Occupational Therapy Evaluation Patient Details Name: Anthony Ferguson MRN: [id number] DOB: 10-13-1950 Today's Date: 05/23/2021   History of Present Illness Pt is 71 y.o. male presented to [facility] ED 3/31 with s/s stroke. CT head revealed left MCA M1 occlusion. Pt transfered to [facility] for emergent mechanical thrombectomy. Intubated for procedure, extubated same day.  PMH HIV, HTN, hypercholesterolemia diabetes type 2, + cocaine use. MRI positive for acute infarcts in left corona radiata and basal ganglia with hemorrhage in posterior left basal ganglia.   Clinical Impression   PTA pt lived alone independently and enjoyed playing golf and fishing. Pt currently requires Max A +2 for functional mobility and max to total A with LB ADL tasks due to below listed deficits.  Recommend intensive rehab at [facility]. Pt will most likely need physical assistance for mobility and ADL after DC. Acute OT to follow.      Recommendations for follow up therapy are one component of a multi-disciplinary discharge planning process, led by the attending physician.  Recommendations may be updated based on patient status, additional functional criteria and insurance authorization.   Follow Up Recommendations  Acute inpatient rehab (3hours/day)    Assistance Recommended at Discharge Frequent or constant Supervision/Assistance  Patient can return home with the following Two people to help with walking and/or transfers;Two people to help with bathing/dressing/bathroom;Assistance with cooking/housework;Direct supervision/assist for medications management;Direct supervision/assist for financial management;Assist for transportation;Help with stairs or ramp for entrance    Functional Status Assessment  Patient has had a recent decline in their functional status and demonstrates the ability to make significant improvements in function in a reasonable and predictable amount of time.  Equipment Recommendations  BSC/3in1;Wheelchair  (measurements OT);Wheelchair cushion (measurements OT);Tub/shower bench    Recommendations for Other Services Rehab consult     Precautions / Restrictions Precautions Precautions: Fall Precaution Comments: R inattention; cortrak Restrictions Weight Bearing Restrictions: No      Mobility Bed Mobility Overal bed mobility: Needs Assistance Bed Mobility: Supine to Sit Rolling: Mod assist Sidelying to sit: HOB elevated, Mod assist Supine to sit: Mod assist, +2 for safety/equipment     General bed mobility comments: Moved to R side of bed requiring cues and assist for R LE and to lift trunk    Transfers Overall transfer level: Needs assistance Equipment used: 2 person hand held assist Transfers: Sit to/from Stand Sit to Stand: Mod assist, +2 physical assistance Stand pivot transfers: Max assist, +2 physical assistance Squat pivot transfers: Max assist       General transfer comment: Stood with "3 muskateer" stance ; pivoted to chair with R LE blocked.  Attempted to take steps with assist to move legs but pt unable to weight shift on R LE to advance L LE      Balance Overall balance assessment: Needs assistance Sitting-balance support: Feet supported, Single extremity supported Sitting balance-Leahy Scale: Poor Sitting balance - Comments: Pt initially leaning to the R requiring mod A and pushing.  Also, slight forward lean.  Pt could correct with cues and L UE support. Progressed to sitting with close supervision and cues briefly to min A.  Required mod A when challenged.  Sat EOB at least 5 mins. Postural control: Right lateral lean Standing balance support: Bilateral upper extremity supported Standing balance-Leahy Scale: Zero Standing balance comment: Requiring mod A x 2 and cues to stand upright  ADL either performed or assessed with clinical judgement   ADL                                               Vision  Baseline Vision/History: 1 Wears glasses Additional Comments: ? R field cut; will further assess     Perception Perception Comments: will further assess   Praxis      Pertinent Vitals/Pain Pain Assessment Pain Assessment: No/denies pain     Hand Dominance Left   Extremity/Trunk Assessment Upper Extremity Assessment Upper Extremity Assessment: RUE deficits/detail RUE Deficits / Details: PROM WFL; unableto elicit active movement this date; flaccid RUE Sensation: decreased light touch;decreased proprioception RUE Coordination: decreased gross motor;decreased fine motor (non-functional)   Lower Extremity Assessment Lower Extremity Assessment: Defer to PT evaluation RLE Coordination: decreased gross motor;decreased fine motor   Cervical / Trunk Assessment Cervical / Trunk Assessment: Other exceptions;Kyphotic (R lateral lean)   Communication Communication Communication: Expressive difficulties   Cognition Arousal/Alertness: Awake/alert Behavior During Therapy: WFL for tasks assessed/performed Overall Cognitive Status: Impaired/Different from baseline Area of Impairment: Orientation, Attention, Following commands, Problem solving, Awareness, Safety/judgement                 Orientation Level: Disoriented to, Time Current Attention Level: Sustained   Following Commands: Follows one step commands consistently, Follows one step commands with increased time Safety/Judgement: Decreased awareness of safety, Decreased awareness of deficits Awareness: Emergent Problem Solving: Slow processing, Decreased initiation, Requires verbal cues, Requires tactile cues, Difficulty sequencing General Comments: unaware of falling toard R; postural control affected by inattention     General Comments  Pt's arms placed over therapists shoulders to increase trunk extension and attentp to shift weight to step toward R; Pt unable to step with L foot    Exercises General Exercises - Lower  Extremity Ankle Circles/Pumps:  (with tapping and verbal facilitation) Quad Sets:  (Attempted but pt only able on L)   Shoulder Instructions      Home Living Family/patient expects to be discharged to:: Private residence Living Arrangements: Alone Available Help at Discharge: Family;Available 24 hours/day (?? states daughter, son could assist) Type of Home: Apartment (second floor) Home Access: Elevator     Home Layout: One level     Bathroom Shower/Tub: Chief Strategy Officer: Standard     Home Equipment: None      Lives With: Alone    Prior Functioning/Environment Prior Level of Function : Independent/Modified Independent             Mobility Comments: help for transportation from friends (likes to fish and play golf)          OT Problem List: Decreased strength;Decreased range of motion;Decreased activity tolerance;Impaired balance (sitting and/or standing);Impaired vision/perception;Decreased coordination;Decreased cognition;Decreased safety awareness;Decreased knowledge of use of DME or AE;Impaired sensation;Impaired tone;Impaired UE functional use      OT Treatment/Interventions: Self-care/ADL training;Therapeutic exercise;Neuromuscular education;DME and/or AE instruction;Therapeutic activities;Cognitive remediation/compensation;Visual/perceptual remediation/compensation;Patient/family education;Balance training    OT Goals(Current goals can be found in the care plan section) Acute Rehab OT Goals Patient Stated Goal: to get better OT Goal Formulation: With patient Time For Goal Achievement: 06/06/21 Potential to Achieve Goals: Good  OT Frequency: Min 2X/week    Co-evaluation PT/OT/SLP Co-Evaluation/Treatment: Yes Reason for Co-Treatment: For patient/therapist safety;To address functional/ADL transfers   OT goals addressed during session: ADL's and self-care  AM-PAC OT "6 Clicks" Daily Activity     Outcome Measure Help from another  person eating meals?: A Little Help from another person taking care of personal grooming?: A Little Help from another person toileting, which includes using toliet, bedpan, or urinal?: Total Help from another person bathing (including washing, rinsing, drying)?: A Lot Help from another person to put on and taking off regular upper body clothing?: A Lot Help from another person to put on and taking off regular lower body clothing?: Total 6 Click Score: 12   End of Session Equipment Utilized During Treatment: Gait belt Nurse Communication: Mobility status;Need for lift equipment Antony Salmon)  Activity Tolerance: Patient tolerated treatment well Patient left: in chair;with call bell/phone within reach;with chair alarm set  OT Visit Diagnosis: Unsteadiness on feet (R26.81);Other abnormalities of gait and mobility (R26.89);Muscle weakness (generalized) (M62.81);Other symptoms and signs involving cognitive function;Other symptoms and signs involving the nervous system (R29.898);Hemiplegia and hemiparesis Hemiplegia - Right/Left: Right Hemiplegia - dominant/non-dominant: Non-Dominant Hemiplegia - caused by: Cerebral infarction                Time: 1133-1212 OT Time Calculation (min): 39 min Charges:  OT General Charges $OT Visit: 1 Visit OT Evaluation $OT Eval Moderate Complexity: 1 Mod OT Treatments $Self Care/Home Management : 8-22 mins  Luisa Dago, OT/L   Acute OT Clinical Specialist Acute Rehabilitation Services Pager [phone] Office [phone]   [facility] 05/23/2021, 1:55 PM

## 2021-05-23 NOTE — Progress Notes (Addendum)
STROKE TEAM PROGRESS NOTE  ? ?INTERVAL HISTORY ?Patient is seen in his room with no family at the bedside.  He has had no acute events overnight.  MRI shows acute infarcts in left corona radiata and basal ganglia with hemorrhage in posterior left basal ganglia.   ?Repeat CT head in 3-5 days and then resume [place] ?ASA 81mg  started ? ?Vitals:  ? 05/22/21 2335 05/23/21 0355 05/23/21 0442 05/23/21 0721  ?BP: 117/81  (!) 148/86 130/81  ?Pulse:   73 72  ?Resp: 17  14 18   ?Temp: 98.3 ?F (36.8 ?C)  98.2 ?F (36.8 ?C) 97.6 ?F (36.4 ?C)  ?TempSrc: Oral  Oral Oral  ?SpO2: 99%  100% 99%  ?Weight:  72.7 kg    ?Height:      ? ?CBC:  ?Recent Labs  ?Lab 05/19/21 ?2208 05/20/21 ?0228 05/21/21 ?[id number] 05/22/21 ?0435 05/23/21 ?0225  ?WBC 3.3* 2.8*   < > 5.3 5.3  ?NEUTROABS 2.2 1.8  --   --   --   ?HGB 13.6 12.6*   < > 13.2 13.7  ?HCT 41.0 36.1*   < > 38.6* 39.3  ?MCV 91.7 89.8   < > 89.6 89.5  ?PLT 134* 126*   < > 129* 125*  ? < > = values in this interval not displayed.  ? ? ?Basic Metabolic Panel:  ?Recent Labs  ?Lab 05/22/21 ?0435 05/22/21 ?1646 05/23/21 ?0225  ?NA 135  --  133*  ?K 3.9  --  3.9  ?CL 104  --  103  ?CO2 25  --  21*  ?GLUCOSE 198*  --  112*  ?BUN 9  --  13  ?CREATININE 0.92  --  0.80  ?CALCIUM 8.8*  --  8.8*  ?MG 1.9 2.0  --   ?PHOS 3.1 3.3  --   ? ? ?Lipid Panel:  ?Recent Labs  ?Lab 05/20/21 ?0228  ?CHOL 141  ?TRIG 37  ?HDL 69  ?CHOLHDL 2.0  ?VLDL 7  ?[place] 65  ? ? ?HgbA1c:  ?Recent Labs  ?Lab 05/20/21 ?0228  ?HGBA1C 7.9*  ? ? ?Urine Drug Screen:  ?Recent Labs  ?Lab 05/19/21 ?2217  ?LABOPIA NONE DETECTED  ?COCAINSCRNUR POSITIVE*  ?LABBENZ NONE DETECTED  ?AMPHETMU NONE DETECTED  ?THCU NONE DETECTED  ?LABBARB NONE DETECTED  ? ?  ?Alcohol Level  ?Recent Labs  ?Lab 05/19/21 ?2208  ?ETH <10  ? ? ? ?IMAGING past 24 hours ?No results found. ? ?PHYSICAL EXAM ?General:  Thin-appearing African American patient in no acute distress.   ?Respiratory:  Respirations regular and unlabored ? ? ?NEURO:  ?Mental Status:  AA&Ox2 ?Speech/Language: speech is without dysarthria or aphasia.  Naming, fluency, and comprehension intact. ? ?Cranial Nerves:  ?II: PERRL. Visual fields full, but right sided visual neglect present ?III, IV, VI: EOMI. Eyelids elevate symmetrically. Left gaze preference but can cross midline ?V: Sensation is intact to light touch and symmetrical to face.  ?VII: Smile is symmetrical.  ?VIII: hearing intact to voice. ?IX, X: Phonation is normal.  ?XII: tongue is midline without fasciculations. ?Motor: Dense hemiplegia, right paresis,  ?5/5 strength to LUE and LLE, 0/5 to RUE, 2/5 to RLE  ?Tone: is normal and bulk is normal ?Sensation- Intact to light touch bilaterally, but decrease right sensation  ?Gait- deferred ? ? ?ASSESSMENT/PLAN ?[name] is a 71 y.o. male with history of HTN, HIV, HLD,  DM2 and Eliquis use for atrial fibrillation presenting after he fell out of his chair while drinking outside  and noticed a right sided facial droop.  He then presented to [facility] and was found to have right sided weakness and speech impairments.  CAT showed left hyperdense MCA sign.  Patient was found to lave a left MCA M1 occlusion and was taken for mechanical thrombectomy.  This was successful with TICI 3 flow achieved.  He was extubated on 3/31.  MRI 4/1 shows acute infarcts in left corona radiata, punctate infarcts in left frontal lobe and left temporal lobe and basal ganglia with hemorrhage in posterior left basal ganglia.  Repeat head CT is scheduled for 4/5 prior to resuming [place] ? ?Stroke:  left MCA M1 infarct likely secondary cardio embolic source in setting of known atrial fibrillation with unclear Eliquis compliance as well as cocaine abuse s/p thrombectomy with TICI 3 revascularization ?CT head No acute abnormality. Age-indeterminate right nasal bone fracture ?CTA head & neck occlusive thrombus in proximal-mid left MCA M1 segment, moderate to severe stenosis of origin of left vertebral  artery ?CT perfusion 150 cc penumbra, no core ?Post IR CT no hemorrhage ?MRI  acute infarcts in left corona radiata, punctate infarcts in left frontal lobe and left temporal lobe and basal ganglia with hemorrhage in posterior left basal ganglia ?2D Echo EF 55-60%, interatrial septum not well visualized ?LDL 65 ?HgbA1c 7.9 ?VTE prophylaxis - SCDs ?   ?Diet  ? DIET DYS 3 Room service appropriate? Yes; Fluid consistency: Thin  ? ?Eliquis (apixaban) daily with unclear compliance prior to admission, now on No antithrombotic secondary to hemorrhage.  ?Therapy recommendations:  CIR ?Disposition:  pending ? ?Hypertension ?Home meds:  none ?Stable, no longer requiring cleviprex ? amlodipine 10 mg daily, hydralazine 50mg  q8hrs, zestril 10mg  daily ?Keep SBP 120-160  ?Long-term BP goal normotensive ? ?Hyperlipidemia ?Home meds:  pravastatin 20 mg daily, restarted in hospital ?LDL 65, goal < 70 ?High intensity statin not indicated as LDL below goal ?Continue statin at discharge ? ?Diabetes type II Uncontrolled ?Home meds:  glipzide 10 mg daily ?HgbA1c 7.9, goal < 7.0 ?CBGs ?Recent Labs  ?  05/22/21 ?2320 05/23/21 ?0439 05/23/21 ?[id number]  ?GLUCAP 147* 150* 175*  ? ?  ?SSI ? ?HIV infection ?On home [place]  ? ?History of atrial fibrillation ?History of atrial fibrillation ?On home Eliquis, unclear compliance ?Cardizem 30 mg q8h ?Head CT scheduled for 4/5 and consider resuming anticoagulation then ? ?Respiratory Failure ?Left intubated after thrombectomy ?Ventilator management per CCM ?Extubated 3/31 ? ?Other Stroke Risk Factors ?Advanced Age >/= 18  ?Cigarette smoker will advise to stop smoking ?Substance abuse - UDS:  THC NONE DETECTED, Cocaine POSITIVE. Patient advised to stop using due to stroke risk. ?Coronary artery disease ? ?Other Active Problems ? ?Patient seen and examined by NP/APP with MD. MD to update note as needed.  ? ?Janine Ores, DNP, FNP-BC ?[facility] ?Pager: [phone] ? I have personally  obtained history,examined this patient, reviewed notes, independently viewed imaging studies, participated in medical decision making and plan of care.ROS completed by me personally and pertinent positives fully documented  I have made any additions or clarifications directly to the above note. Agree with note above.  Start aspirin 81 mg daily and repeat CT head in the next couple of days and will consider starting anticoagulation in 5 to 7 days post stroke.  No family available for discussion.  Hopefully transfer to inpatient rehab in the next few days when bed available.  Greater than 50% time during the 35-minute visit was spent in counseling and  coordination of care and discussion patient and care team and answering questions due to stroke and hemorrhagic transformation and need for anticoagulation ? ?Antony Contras, MD ?Medical Director ?[facility] ?Pager: [phone] ?05/23/2021 2:51 PM ? ?To contact Stroke Continuity provider, please refer to [email or web address] ?After hours, contact General Neurology  ?

## 2021-05-23 NOTE — Plan of Care (Signed)
  Problem: Education: Goal: Knowledge of disease or condition will improve Outcome: Progressing Goal: Knowledge of secondary prevention will improve (SELECT ALL) Outcome: Progressing   

## 2021-05-23 NOTE — Progress Notes (Signed)
Speech Language Pathology Treatment: Dysphagia;Cognitive-Linquistic  ?Patient Details ?Name: Anthony Ferguson ?MRN: [id number] ?DOB: 03/16/1950 ?Today's Date: 05/23/2021 ?Time: [id number] ?SLP Time Calculation (min) (ACUTE ONLY): 29 min ? ?Assessment / Plan / Recommendation ?Clinical Impression ? Pt was seen for dysphagia/communication treatment. He was encountered with breakfast tray in front of him. His diet had been advanced to dysphagia 3 over the weekend despite SLP recs for full liquids. His right side of mouth was full of food without pt awareness and multiple pieces of french toast were retrieved.  His diet was changed back to dysphagia 1/thin liquids.  Pt demonstrated ongoing right sensory deficits, poor attention with moderate verbal/visual cues needed to attend to spillage and pocketing. ? ?Pt's communication was improved with regard to language - he demonstrated improved naming and no further episodes of perseveration today. He continues to follow commands intermittently.  He required moderate verbal cues to over-articulate in an effort to improve clarity of speech. ? ?Recommend ongoing SLP f/u here and in next level of care. ?  ?HPI HPI: 71 y.o. male presented to [facility] ED 3/31 with s/s stroke. CT head revealed left MCA M1 occlusion. Pt transfered to [facility] for emergent mechanical thrombectomy. Intubated for procedure, extubated same day.  PMH HIV, HTN, hypercholesterolemia diabetes type 2, + cocaine use. ?  ?   ?SLP Plan ? Continue with current plan of care ? ?  ?  ?Recommendations for follow up therapy are one component of a multi-disciplinary discharge planning process, led by the attending physician.  Recommendations may be updated based on patient status, additional functional criteria and insurance authorization. ?  ? ?Recommendations  ?Diet recommendations: Dysphagia 1 (puree);Thin liquid ?Liquids provided via: Cup;Straw ?Medication Administration: Crushed with puree ?Supervision: Staff to assist with self  feeding ?Compensations: Minimize environmental distractions;Slow rate;Lingual sweep for clearance of pocketing ?Postural Changes and/or Swallow Maneuvers: Seated upright 90 degrees  ?   ?    ?   ? ? ? ? Oral Care Recommendations: Oral care BID ?Follow Up Recommendations: Acute inpatient rehab (3hours/day) ?Assistance recommended at discharge: Frequent or constant Supervision/Assistance ?SLP Visit Diagnosis: Dysarthria and anarthria (R47.1) ?Plan: Continue with current plan of care ? ? ? ? ?  ?  ?Anthony Borin L. Django Nguyen, MA CCC/SLP ?Acute Rehabilitation Services ?Office number [phone] ?Pager [phone] ? ? ?Anthony Ferguson ? ?05/23/2021, 11:53 AM ?

## 2021-05-24 DIAGNOSIS — I63512 Cerebral infarction due to unspecified occlusion or stenosis of left middle cerebral artery: Secondary | ICD-10-CM | POA: Diagnosis not present

## 2021-05-24 LAB — GLUCOSE, CAPILLARY
Glucose-Capillary: 102 mg/dL — ABNORMAL HIGH (ref 70–99)
Glucose-Capillary: 148 mg/dL — ABNORMAL HIGH (ref 70–99)
Glucose-Capillary: 166 mg/dL — ABNORMAL HIGH (ref 70–99)
Glucose-Capillary: 219 mg/dL — ABNORMAL HIGH (ref 70–99)
Glucose-Capillary: 259 mg/dL — ABNORMAL HIGH (ref 70–99)
Glucose-Capillary: 276 mg/dL — ABNORMAL HIGH (ref 70–99)

## 2021-05-24 MED ORDER — FOLIC ACID 1 MG PO TABS: 1.0000 mg | ORAL_TABLET | Freq: Every day | ORAL | Status: DC

## 2021-05-24 MED ORDER — DILTIAZEM HCL 30 MG PO TABS
30.0000 mg | ORAL_TABLET | Freq: Three times a day (TID) | ORAL | Status: DC
  Administered 2021-05-24 – 2021-05-26 (×6): 30 mg via ORAL
  Filled 2021-05-24 (×6): qty 1

## 2021-05-24 MED ORDER — THIAMINE HCL 100 MG PO TABS
100.0000 mg | ORAL_TABLET | Freq: Every day | ORAL | Status: DC
  Administered 2021-05-24 – 2021-05-26 (×3): 100 mg via ORAL
  Filled 2021-05-24 (×3): qty 1

## 2021-05-24 MED ORDER — AMLODIPINE BESYLATE 10 MG PO TABS
10.0000 mg | ORAL_TABLET | Freq: Every day | ORAL | Status: DC
  Administered 2021-05-24 – 2021-05-26 (×3): 10 mg via ORAL
  Filled 2021-05-24 (×3): qty 1

## 2021-05-24 MED ORDER — HYDRALAZINE HCL 50 MG PO TABS
50.0000 mg | ORAL_TABLET | Freq: Three times a day (TID) | ORAL | Status: DC
  Administered 2021-05-24 – 2021-05-26 (×6): 50 mg via ORAL
  Filled 2021-05-24 (×6): qty 1

## 2021-05-24 MED ORDER — INSULIN ASPART 100 UNIT/ML IJ SOLN
0.0000 [IU] | Freq: Three times a day (TID) | INTRAMUSCULAR | Status: DC
  Administered 2021-05-24: 5 [IU] via SUBCUTANEOUS
  Administered 2021-05-24: 3 [IU] via SUBCUTANEOUS
  Administered 2021-05-25: 2 [IU] via SUBCUTANEOUS

## 2021-05-24 MED ORDER — AMLODIPINE BESYLATE 10 MG PO TABS: 10.0000 mg | ORAL_TABLET | Freq: Every day | ORAL | Status: DC

## 2021-05-24 MED ORDER — ASPIRIN 81 MG PO CHEW
81.0000 mg | CHEWABLE_TABLET | Freq: Every day | ORAL | Status: DC
  Administered 2021-05-24 – 2021-05-25 (×2): 81 mg via ORAL
  Filled 2021-05-24 (×3): qty 1

## 2021-05-24 MED ORDER — ASPIRIN 81 MG PO CHEW
81.0000 mg | CHEWABLE_TABLET | Freq: Every day | ORAL | Status: DC
Start: 2021-05-25 — End: 2021-05-24

## 2021-05-24 MED ORDER — THIAMINE HCL 100 MG PO TABS: 100.0000 mg | ORAL_TABLET | Freq: Every day | ORAL | Status: DC

## 2021-05-24 MED ORDER — GLUCERNA SHAKE PO LIQD
237.0000 mL | Freq: Three times a day (TID) | ORAL | Status: DC
  Administered 2021-05-24 – 2021-05-25 (×2): 237 mL via ORAL

## 2021-05-24 MED ORDER — INSULIN ASPART 100 UNIT/ML IJ SOLN
3.0000 [IU] | Freq: Three times a day (TID) | INTRAMUSCULAR | Status: DC
  Administered 2021-05-24 – 2021-05-25 (×3): 3 [IU] via SUBCUTANEOUS

## 2021-05-24 MED ORDER — FOLIC ACID 1 MG PO TABS
1.0000 mg | ORAL_TABLET | Freq: Every day | ORAL | Status: DC
Start: 2021-05-24 — End: 2021-05-26
  Administered 2021-05-24 – 2021-05-26 (×3): 1 mg via ORAL
  Filled 2021-05-24 (×3): qty 1

## 2021-05-24 MED ORDER — ADULT MULTIVITAMIN W/MINERALS CH: 1.0000 | ORAL_TABLET | Freq: Every day | ORAL | Status: DC

## 2021-05-24 MED ORDER — ADULT MULTIVITAMIN W/MINERALS CH
1.0000 | ORAL_TABLET | Freq: Every day | ORAL | Status: DC
  Administered 2021-05-24 – 2021-05-26 (×3): 1 via ORAL
  Filled 2021-05-24 (×3): qty 1

## 2021-05-24 MED ORDER — PRAVASTATIN SODIUM 40 MG PO TABS
20.0000 mg | ORAL_TABLET | Freq: Every day | ORAL | Status: DC
  Administered 2021-05-24 – 2021-05-25 (×2): 20 mg via ORAL
  Filled 2021-05-24 (×2): qty 1

## 2021-05-24 MED ORDER — PANTOPRAZOLE 2 MG/ML SUSPENSION
40.0000 mg | Freq: Every day | ORAL | Status: DC
  Administered 2021-05-24: 40 mg via ORAL
  Filled 2021-05-24: qty 20

## 2021-05-24 MED ORDER — LISINOPRIL 10 MG PO TABS
10.0000 mg | ORAL_TABLET | Freq: Every day | ORAL | Status: DC
  Administered 2021-05-24 – 2021-05-26 (×3): 10 mg via ORAL
  Filled 2021-05-24 (×3): qty 1

## 2021-05-24 MED ORDER — INSULIN ASPART 100 UNIT/ML IJ SOLN: 0.0000 [IU] | Freq: Three times a day (TID) | INTRAMUSCULAR | Status: DC

## 2021-05-24 MED ORDER — LISINOPRIL 10 MG PO TABS: 10.0000 mg | ORAL_TABLET | Freq: Every day | ORAL | Status: DC

## 2021-05-24 NOTE — Progress Notes (Addendum)
Received report from [facility], patient removed small-bore feeding tube right before change of shift.  ?This RN removed bridle from nose. Patient in no acute distress.  ?This RN notified primary team via messaging D. Charlean Merl NP with Stroke Team  ?

## 2021-05-24 NOTE — Care Management Important Message (Signed)
Important Message ? ?Patient Details  ?Name: Anthony Ferguson ?MRN: [id number] ?Date of Birth: 1950-11-30 ? ? ?Medicare Important Message Given:  Yes ? ? ? ? ?Decorey Wahlert ?05/24/2021, 2:58 PM ?

## 2021-05-24 NOTE — Care Management Important Message (Signed)
Important Message ? ?Patient Details  ?Name: Anthony Ferguson ?MRN: [id number] ?Date of Birth: 1950/06/04 ? ? ?Medicare Important Message Given:  Yes ? ? ? ? ?[name] ?05/24/2021, 2:54 PM ?

## 2021-05-24 NOTE — Progress Notes (Signed)
Inpatient Diabetes Program Recommendations ? ?AACE/ADA: New Consensus Statement on Inpatient Glycemic Control (2015) ? ?Target Ranges:  Prepandial:   less than 140 mg/dL ?     Peak postprandial:   less than 180 mg/dL (1-2 hours) ?     Critically ill patients:  140 - 180 mg/dL  ? ?Lab Results  ?Component Value Date  ? GLUCAP 148 (H) 05/24/2021  ? HGBA1C 7.9 (H) 05/20/2021  ? ? ?Review of Glycemic Control ? Latest Reference Range & Units 05/24/21 00:24 05/24/21 04:24 05/24/21 07:28  ?Glucose-Capillary 70 - 99 mg/dL 166 (H) 276 (H) 148 (H)  ?(H): Data is abnormally high ?Diabetes history: Type 2 DM ?Outpatient Diabetes medications: none ?Current orders for Inpatient glycemic control: Novolog 3 units Q4H, Novolog 0-9 units Q4h ? ?Inpatient Diabetes Program Recommendations:   ? ?If plan is not to replace tube feeds, consider discontinuing tube feed coverage: Novolog 3 units Q4H. Secure chat sent. ? ?Thanks, ?Bronson Curb, MSN, RNC-OB ?Diabetes Coordinator ?[phone] (8a-5p) ? ? ? ? ?

## 2021-05-24 NOTE — Progress Notes (Signed)
Inpatient Rehab Admissions Coordinator:  ? ?Unable to reach daughter or son this AM.  Will continue efforts.  May need to begin SNF if caregiver support cannot be verified.   ? ?Estill Dooms, PT, DPT ?Admissions Coordinator ?[phone] ?05/24/21  ?10:51 AM ? ?

## 2021-05-24 NOTE — Progress Notes (Signed)
Nutrition Follow-up ? ?DOCUMENTATION CODES:  ?Severe malnutrition in context of social or environmental circumstances ? ?INTERVENTION:  ?-Glucerna Shake po TID, each supplement provides 220 kcal and 10 grams of protein ?-MVI with minerals daily ? ?NUTRITION DIAGNOSIS:  ?Severe Malnutrition related to social / environmental circumstances (drug abuse, etoh abuse) as evidenced by severe fat depletion, severe muscle depletion. ?ongoing ? ?GOAL:  ?Patient will meet greater than or equal to 90% of their needs ?progressing ? ?MONITOR:  ?Vent status, TF tolerance, I & O's, Labs ? ?REASON FOR ASSESSMENT:  ?New TF (cortrak placement) ?  ? ?ASSESSMENT:  ?Pt with hx of HTN, HLD, HIV, DM type 2, and prior stroke presented to ED from home after falling out of his chair (intoxicated at the time of event). Code stroke called in ED after pt developed right sided paralysis and confusion. Emergent CT angio showed an M1 occlusion. ? ?3/31 - cortrak placed  ?4/01 - diet advanced to full liquids ?4/02 - diet advanced to dysphagia 3 with thin liquids ?4/03 - diet downgrading to dysphagia 1 with thin liquids ? ?Pt pulled out Cortrak this morning per RN. SLP evaluated pt and he is now on dysphagia 1 diet with thin liquids. Will order ONS and monitor for adequacy of intake.  ? ?PO Intake: 25-75% x 3 recorded meals  (50% avg meal intake) ? ?UOP: 2253ml x24 hours ?I/O: +356ml since admit ? ?Current weight: 73.1 kg ?Admit weight: 76.2 kg  ? ?Medications: ? bictegravir-emtricitabine-tenofovir AF  1 tablet Oral Daily  ? folic acid  1 mg Oral Daily  ? insulin aspart  0-9 Units Subcutaneous TID AC & HS  ? insulin aspart  3 Units Subcutaneous TID AC & HS  ? multivitamin with minerals  1 tablet Oral Daily  ? pantoprazole sodium  40 mg Oral QHS  ? thiamine  100 mg Oral Daily  ? ?Labs: ?Recent Labs  ?Lab 05/21/21 ?0619 05/21/21 ?1753 05/22/21 ?0435 05/22/21 ?1646 05/23/21 ?0225  ?NA 135  --  135  --  133*  ?K 3.6  --  3.9  --  3.9  ?CL 107  --  104  --   103  ?CO2 21*  --  25  --  21*  ?BUN 10  --  9  --  13  ?CREATININE 0.93  --  0.92  --  0.80  ?CALCIUM 8.9  --  8.8*  --  8.8*  ?MG 2.0 1.9 1.9 2.0  --   ?PHOS 2.6 3.1 3.1 3.3  --   ?GLUCOSE 196*  --  198*  --  112*  ?CBGs: 148-276 x24 hours (diabetes coordinator following) ? ?Diet Order:   ?Diet Order   ? ?       ?  DIET - DYS 1 Room service appropriate? Yes with Assist; Fluid consistency: Thin  Diet effective now       ?  ? ?  ?  ? ?  ? ?EDUCATION NEEDS:  ?Not appropriate for education at this time ? ?Skin:  Skin Assessment: Reviewed RN Assessment ? ?Last BM:  4/3 ? ?Height:  ?Ht Readings from Last 1 Encounters:  ?05/19/21 6\' 3"  (1.905 m)  ? ?Weight:  ?Wt Readings from Last 1 Encounters:  ?05/24/21 73.1 kg  ? ?Ideal Body Weight:  89.1 kg ? ?BMI:  Body mass index is 20.14 kg/m?. ? ?Estimated Nutritional Needs:  ?Kcal:  2200-2500 kcal/d ?Protein:  110-130 g/d ?Fluid:  2.2-2.5 L/d ? ? ? ?Theone Stanley., MS, RD, LDN (  she/her/hers) ?RD pager number and weekend/on-call pager number located in [place]. ?

## 2021-05-24 NOTE — Progress Notes (Signed)
Speech Language Pathology Treatment: Dysphagia;Cognitive-Linquistic  ?Patient Details ?Name: Anthony Ferguson ?MRN: [id number] ?DOB: 09/19/1950 ?Today's Date: 05/24/2021 ?Time: [id number] ?SLP Time Calculation (min) (ACUTE ONLY): 22 min ? ?Assessment / Plan / Recommendation ?Clinical Impression ? Asked by RN to see [name], who pulled cortrak last night. Pt was in good spirits, smiling and more interactive and communicative today.  Improved spontaneity of speech.  Dysarthria/low volume persist, but expressive language continues to improve. Pt stated his daughter lives on the [place] side of [place], and he asked for help finding a [place] on the television.  He required min verbal cues to improve clarity. ?He demonstrated ongoing sensory deficits left oral cavity - however, he showed improved ability to clear oral residue with tongue and to attend to spillage with min verbal cues.  He fed himself with LUE with assist to stabilize containers. He is protecting his airway well. Recommend continued dysphagia 1 secondary to oral deficits.  SLP will follow. ?  ?HPI HPI: 71 y.o. male [facility] ED [facility] with s/s stroke. CT head revealed left MCA M1 occlusion. Pt transfered to [facility] for emergent mechanical thrombectomy. Intubated for procedure, extubated same day.  PMH HIV, HTN, hypercholesterolemia diabetes type 2, + cocaine use. ?  ?   ?SLP Plan ? Continue with current plan of care ? ?  ?  ?Recommendations for follow up therapy are one component of a multi-disciplinary discharge planning process, led by the attending physician.  Recommendations may be updated based on patient status, additional functional criteria and insurance authorization. ?  ? ?Recommendations  ?Diet recommendations: Dysphagia 1 (puree);Thin liquid ?Liquids provided via: Cup;Straw ?Medication Administration: Crushed with puree ?Supervision: Staff to assist with self feeding ?Compensations: Minimize environmental distractions;Slow rate;Lingual sweep  for clearance of pocketing ?Postural Changes and/or Swallow Maneuvers: Seated upright 90 degrees  ?   ?    ?   ? ? ? ? Oral Care Recommendations: Oral care BID ?Follow Up Recommendations: Acute inpatient rehab (3hours/day) ?Assistance recommended at discharge: Frequent or constant Supervision/Assistance ?SLP Visit Diagnosis: Dysarthria and anarthria (R47.1);Dysphagia, oropharyngeal phase (R13.12) ?Plan: Continue with current plan of care ? ? ? ? ?  ?  ?Anthony Lorenz [name], MA CCC/SLP ?Acute Rehabilitation Services ?Office number [phone] ?Pager [phone] ? ? ?Anthony Ferguson ? ?05/24/2021, 11:34 AM ?

## 2021-05-24 NOTE — Progress Notes (Addendum)
STROKE TEAM PROGRESS NOTE  ? ?INTERVAL HISTORY ?Patient is seen in his room with no family at the bedside.  Pulled out Cortrak this morning. He is working with SLP, follow their recs for diet and encourage oral intake. SSI adjusted to ACHS with CBGs. Awaiting CIR placement and family comfirmation.  ?Vital signs are stable.  Neurological exam is unchanged ?Vitals:  ? 05/23/21 2352 05/24/21 0349 05/24/21 0500 05/24/21 0729  ?BP: (!) 141/83 129/81  138/83  ?Pulse: 91 90  85  ?Resp: 18 18    ?Temp: 98.5 ?F (36.9 ?C) 98.3 ?F (36.8 ?C)  98.3 ?F (36.8 ?C)  ?TempSrc:  Oral  Oral  ?SpO2: 99% 99%  96%  ?Weight:   73.1 kg   ?Height:      ? ?CBC:  ?Recent Labs  ?Lab 05/19/21 ?2208 05/20/21 ?0228 05/21/21 ?[id number] 05/22/21 ?0435 05/23/21 ?0225  ?WBC 3.3* 2.8*   < > 5.3 5.3  ?NEUTROABS 2.2 1.8  --   --   --   ?HGB 13.6 12.6*   < > 13.2 13.7  ?HCT 41.0 36.1*   < > 38.6* 39.3  ?MCV 91.7 89.8   < > 89.6 89.5  ?PLT 134* 126*   < > 129* 125*  ? < > = values in this interval not displayed.  ? ? ?Basic Metabolic Panel:  ?Recent Labs  ?Lab 05/22/21 ?0435 05/22/21 ?1646 05/23/21 ?0225  ?NA 135  --  133*  ?K 3.9  --  3.9  ?CL 104  --  103  ?CO2 25  --  21*  ?GLUCOSE 198*  --  112*  ?BUN 9  --  13  ?CREATININE 0.92  --  0.80  ?CALCIUM 8.8*  --  8.8*  ?MG 1.9 2.0  --   ?PHOS 3.1 3.3  --   ? ? ?Lipid Panel:  ?Recent Labs  ?Lab 05/20/21 ?0228  ?CHOL 141  ?TRIG 37  ?HDL 69  ?CHOLHDL 2.0  ?VLDL 7  ?[place] 65  ? ? ?HgbA1c:  ?Recent Labs  ?Lab 05/20/21 ?0228  ?HGBA1C 7.9*  ? ? ?Urine Drug Screen:  ?Recent Labs  ?Lab 05/19/21 ?2217  ?LABOPIA NONE DETECTED  ?COCAINSCRNUR POSITIVE*  ?LABBENZ NONE DETECTED  ?AMPHETMU NONE DETECTED  ?THCU NONE DETECTED  ?LABBARB NONE DETECTED  ? ?  ?Alcohol Level  ?Recent Labs  ?Lab 05/19/21 ?2208  ?ETH <10  ? ? ? ?IMAGING past 24 hours ?No results found. ? ?PHYSICAL EXAM ?General:  Thin-appearing African American patient in no acute distress.   ?Respiratory:  Respirations regular and unlabored ? ? ?NEURO:  ?Mental  Status: AA&Ox2 ?Speech/Language: speech is without dysarthria or aphasia.  Naming, fluency, and comprehension intact. ? ?Cranial Nerves:  ?II: PERRL. Visual fields full, but right sided visual neglect present ?III, IV, VI: EOMI. Eyelids elevate symmetrically. Left gaze preference but can cross midline ?V: Sensation is intact to light touch and symmetrical to face.  ?VII: Smile is symmetrical.  ?VIII: hearing intact to voice. ?IX, X: Phonation is normal.  ?XII: tongue is midline without fasciculations. ?Motor: Dense hemiplegia, right paresis,  ?5/5 strength to LUE and LLE, 0/5 to RUE, 2/5 to RLE  ?Tone: is normal and bulk is normal ?Sensation- Intact to light touch bilaterally, but decrease right sensation  ?Gait- deferred ? ? ?ASSESSMENT/PLAN ?[name] is a 71 y.o. male with history of HTN, HIV, HLD,  DM2 and Eliquis use for atrial fibrillation presenting after he fell out of his chair while drinking outside and  noticed a right sided facial droop.  He then presented to [facility] and was found to have right sided weakness and speech impairments.  CAT showed left hyperdense MCA sign.  Patient was found to lave a left MCA M1 occlusion and was taken for mechanical thrombectomy.  This was successful with TICI 3 flow achieved.  He was extubated on 3/31.  MRI 4/1 shows acute infarcts in left corona radiata, punctate infarcts in left frontal lobe and left temporal lobe and basal ganglia with hemorrhage in posterior left basal ganglia.  Repeat head CT is scheduled for 4/5 prior to resuming [place] ? ?Stroke:  left MCA M1 infarct likely secondary cardio embolic source in setting of known atrial fibrillation with unclear Eliquis compliance as well as cocaine abuse s/p thrombectomy with TICI 3 revascularization ?CT head No acute abnormality. Age-indeterminate right nasal bone fracture ?CTA head & neck occlusive thrombus in proximal-mid left MCA M1 segment, moderate to severe stenosis of origin of left  vertebral artery ?CT perfusion 150 cc penumbra, no core ?Post IR CT no hemorrhage ?MRI  acute infarcts in left corona radiata, punctate infarcts in left frontal lobe and left temporal lobe and basal ganglia with hemorrhage in posterior left basal ganglia ?2D Echo EF 55-60%, interatrial septum not well visualized ?LDL 65 ?HgbA1c 7.9 ?VTE prophylaxis - SCDs ?   ?Diet  ? DIET - DYS 1 Room service appropriate? Yes with Assist; Fluid consistency: Thin  ? ?Eliquis (apixaban) daily with unclear compliance prior to admission, now on ASA 81mg  until we are able to safely resume [place].  ?Therapy recommendations:  CIR ?Disposition:  pending ? ?Hypertension ?Home meds:  none ?Stable, no longer requiring cleviprex ? amlodipine 10 mg daily, hydralazine 50mg  q8hrs, zestril 10mg  daily ?Keep SBP 120-160  ?Long-term BP goal normotensive ? ?Hyperlipidemia ?Home meds:  pravastatin 20 mg daily, restarted in hospital ?LDL 65, goal < 70 ?High intensity statin not indicated as LDL below goal ?Continue statin at discharge ? ?Diabetes type II Uncontrolled ?Home meds:  glipzide 10 mg daily ?HgbA1c 7.9, goal < 7.0 ?CBGs ?Recent Labs  ?  05/24/21 ?[id number] 05/24/21 ?0424 05/24/21 ?[id number]  ?GLUCAP 166* 276* 148*  ? ?  ?SSI ? ?HIV infection ?On home [place]  ? ?History of atrial fibrillation ?History of atrial fibrillation ?On home Eliquis, unclear compliance ?Cardizem 30 mg q8h ?Head CT scheduled for 4/5 and consider resuming anticoagulation then ? ?Respiratory Failure ?Left intubated after thrombectomy ?Ventilator management per CCM ?Extubated 3/31 ? ?Other Stroke Risk Factors ?Advanced Age >/= 109  ?Cigarette smoker will advise to stop smoking ?Substance abuse - UDS:  THC NONE DETECTED, Cocaine POSITIVE. Patient advised to stop using due to stroke risk. ?Coronary artery disease ? ?Other Active Problems ? ?Patient seen and examined by NP/APP with MD. MD to update note as needed.  ? ?Janine Ores, DNP, FNP-BC ?[facility] ?Pager: [phone] ?I have personally obtained history,examined this patient, reviewed notes, independently viewed imaging studies, participated in medical decision making and plan of care.ROS completed by me personally and pertinent positives fully documented  I have made any additions or clarifications directly to the above note. Agree with note above.  Patient appears medically stable to be transferred for rehab but inpatient rehab team feels patient may not have adequate caregiver support and SNF may also be an option to be considered.  Greater than 50% time during this 35-minute visit was spent in counseling and coordination of care and discussion with patient and care team  and answering questions  and  plan for rehabilitation ? ?Antony Contras, MD ?Medical Director ?[facility] ?Pager: [phone] ?05/24/2021 1:04 PM ? ? ?To contact Stroke Continuity provider, please refer to [email or web address] ?After hours, contact General Neurology  ?

## 2021-05-24 NOTE — TOC Initial Note (Signed)
Transition of Care (TOC) - Initial/Assessment Note  ? ? ?Patient Details  ?Name: Anthony Ferguson ?MRN: [id number] ?Date of Birth: [birth year] ? ?Transition of Care (TOC) CM/SW Contact:    ?Anthony Friar, RN ?Phone Number: ?05/24/2021, 3:36 PM ? ?Clinical Narrative:                 ?CIR has been attempting to talk with patient's daughter and son without success. CM met with the patient and he was able to provide CM with number for Anthony Ferguson who is daughters boyfriend per pt. CM talked to Anthony Ferguson and he is going to try and locate the patient's daughter and have her call CM.  ?CM has called and texted (using HIPPA guidelines) patients son without a response.  ?With patients permission CM has called patients friend: Anthony Ferguson and he is also going to try and find the patients daughter and have her call CM.  ?CM discussed SNF rehab with the patient and he is agreeable to being faxed out in the [facility] and [place] areas.  ?TOC following. ? ?Expected Discharge Plan: [place] ?Barriers to Discharge: Ship broker, Active Substance Use - Placement, SNF Pending bed offer ? ? ?Patient Goals and CMS Choice ?  ?CMS Medicare.gov Compare Post Acute Care list provided to:: Patient ?Choice offered to / list presented to : Patient ? ?Expected Discharge Plan and Services ?Expected Discharge Plan: [place] ?In-house Referral: Clinical Social Work ?Discharge Planning Services: CM Consult ?Post Acute Care Choice: [place] ?Living arrangements for the past 2 months: Apartment ?                ?  ?  ?  ?  ?  ?  ?  ?  ?  ?  ? ?Prior Living Arrangements/Services ?Living arrangements for the past 2 months: Apartment ?Lives with:: Self ?Patient language and need for interpreter reviewed:: Yes ?Do you feel safe going back to the place where you live?: Yes      ?Need for Family Participation in Patient Care: Yes (Comment) ?Care giver support system in place?: No (comment) ?   ?Criminal Activity/Legal Involvement Pertinent to Current Situation/Hospitalization: No - Comment as needed ? ?Activities of Daily Living ?  ?  ? ?Permission Sought/Granted ?  ?  ?   ?   ?   ?   ? ?Emotional Assessment ?Appearance:: Appears stated age ?Attitude/Demeanor/Rapport: Engaged ?Affect (typically observed): Accepting ?Orientation: : Oriented to Self, Oriented to Place, Oriented to Situation ?Alcohol / Substance Use: Illicit Drugs ?Psych Involvement: No (comment) ? 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 Diagnosis Date Noted  ? Acute ischemic left MCA stroke ([place]) 05/20/2021  ? 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 Protein-calorie malnutrition, severe 05/20/2021  ? Erectile dysfunction 11/26/2018  ? Cocaine abuse ([place]) 04/14/2015  ? Normocytic anemia 04/14/2015  ? Thrombocytopenia ([place]) 04/14/2015  ? Human immunodeficiency virus (HIV) disease ([place]) 03/03/2006  ? Chronic hepatitis C virus infection ([place]) 03/03/2006  ? Type 2 diabetes mellitus ([place]) 03/03/2006  ? TOBACCO USER 03/03/2006  ? Coronary atherosclerosis 03/03/2006  ? ?PCP:  Patient, No Pcp Per (Inactive) ?Pharmacy:   ?[facility] [place] ?[place] [address] ?Phone: [phone] Fax: [phone] ? ?[facility], [address] ?[place] [address] ?Phone: [phone] Fax: [phone] ? ?[facility] ?[address] ?[place] [address] ?Phone: [phone] Fax: [phone] ? ?[place], [place] ?[place] ?[address] ?[place] [address] ?Phone: [phone] Fax: [phone] ? ?[place], [place] ?[place] ?[place]  [address] ?Phone: [phone] Fax: [phone] ? ? ? ? ?Social Determinants of Health (SDOH) Interventions ?  ? ?Readmission Risk Interventions ?   ? View : No data to display.  ?  ?  ?  ? ? ? ?

## 2021-05-24 NOTE — Plan of Care (Signed)
?  Problem: Education: ?Goal: Knowledge of disease or condition will improve ?Outcome: Progressing ?Goal: Knowledge of patient specific risk factors will improve (INDIVIDUALIZE FOR PATIENT) ?Outcome: Progressing ?  ?Problem: Nutrition: ?Goal: Risk of aspiration will decrease ?Outcome: Progressing ?Goal: Dietary intake will improve ?Outcome: Progressing ?  ?

## 2021-05-24 NOTE — NC FL2 (Addendum)
?[place] MEDICAID FL2 LEVEL OF CARE SCREENING TOOL  ?  ? ?IDENTIFICATION  ?Patient Name: ?Anthony Ferguson Birthdate: 19-Jun-1950 Sex: male Admission Date (Current Location): ?05/19/2021  ?[place] and [place] Number: ? [place] and Address:  ?[facility] [place] [address], [place], [place] [address] ?     Provider Number: ?[id number]  ?Attending Physician Name and Address:  ?Stroke, Md, MD ? Relative Name and Phone Number:  ?  ?   ?Current Level of Care: ?Hospital Recommended Level of Care: ?[place] Prior Approval Number: ?  ? ?Date Approved/Denied: ?  PASRR Number: ?[id number] ? ?Discharge Plan: ?SNF ?  ? ?Current Diagnoses: ?Patient Active Problem List  ? Diagnosis Date Noted  ? Acute ischemic left MCA stroke ([place]) 05/20/2021  ? 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 Protein-calorie malnutrition, severe 05/20/2021  ? Erectile dysfunction 11/26/2018  ? Cocaine abuse ([place]) 04/14/2015  ? Normocytic anemia 04/14/2015  ? Thrombocytopenia ([place]) 04/14/2015  ? Human immunodeficiency virus (HIV) disease ([place]) 03/03/2006  ? Chronic hepatitis C virus infection ([place]) 03/03/2006  ? Type 2 diabetes mellitus ([place]) 03/03/2006  ? TOBACCO USER 03/03/2006  ? Coronary atherosclerosis 03/03/2006  ? ? ?Orientation RESPIRATION BLADDER Height & Weight   ?  ?Self, Situation, Place ? Normal Incontinent Weight: 73.1 kg ?Height:  6\' 3"  (190.5 cm)  ?BEHAVIORAL SYMPTOMS/MOOD NEUROLOGICAL BOWEL NUTRITION STATUS  ?    Continent Diet (dyshagia 1 with thin liquids)  ?AMBULATORY STATUS COMMUNICATION OF NEEDS Skin   ?Extensive Assist Verbally Skin abrasions (abrasion to rt side of head/ bilateral groins with puncture sites--level 0) ?  ?  ?  ?    ?     ?     ? ? ?Personal Care Assistance Level of Assistance  ?Bathing, Feeding, Dressing Bathing Assistance: Maximum assistance ?Feeding assistance: Limited assistance ?Dressing Assistance: Maximum assistance ?   ? ?Functional Limitations  Info  ?Sight, Hearing, Speech Sight Info: Adequate ?Hearing Info: Adequate ?Speech Info: Impaired (dysarthria)  ? ? ?SPECIAL CARE FACTORS FREQUENCY  ?PT (By licensed PT), OT (By licensed OT), Speech therapy   ?  ?PT Frequency: 5x/wk ?OT Frequency: 5x/wk ?  ?  ?Speech Therapy Frequency: 5x/wk ?   ? ? ?Contractures Contractures Info: Not present  ? ? ?Additional Factors Info  ?Code Status, Allergies, Insulin Sliding Scale Code Status Info: Full ?Allergies Info: Hydrochlorothiazide/ pork derived products ?  ?Insulin Sliding Scale Info: Novolog 0-9 units SQ TID/ Novolog 3 units SQ with meals and at bedtime ?  ?   ? ?Current Medications (05/24/2021):  This is the current hospital active medication list ?Current Facility-Administered Medications  ?Medication Dose Route Frequency Provider Last Rate Last Admin  ?  stroke: mapping our early stages of recovery book   Does not apply Once Donnetta Simpers, MD      ? amLODipine (NORVASC) tablet 10 mg  10 mg Oral Daily de Yolanda Manges, Cortney E, NP   10 mg at 05/24/21 1213  ? aspirin chewable tablet 81 mg  81 mg Oral Daily de Yolanda Manges, Cortney E, NP   81 mg at 05/24/21 1213  ? bictegravir-emtricitabine-tenofovir AF (BIKTARVY) 50-200-25 MG per tablet 1 tablet  1 tablet Oral Daily de Yolanda Manges, Cortney E, NP   1 tablet at 05/24/21 1435  ? chlorhexidine (PERIDEX) 0.12 % solution 15 mL  15 mL Mouth Rinse BID Dorene Grebe, MD   15 mL at 05/24/21 1045  ? diltiazem (CARDIZEM)  tablet 30 mg  30 mg Oral Q8H de Yolanda Manges, Cortney E, NP   30 mg at 05/24/21 1435  ? feeding supplement (VITAL AF 1.2 CAL) liquid 1,000 mL  1,000 mL Per Tube Continuous Paulita Fujita B, NP   Stopping previously hung infusion at 05/24/21 0705  ? folic acid (FOLVITE) tablet 1 mg  1 mg Oral Daily de Yolanda Manges, Cortney E, NP   1 mg at 05/24/21 1213  ? hydrALAZINE (APRESOLINE) injection 10 mg  10 mg Intravenous Q4H PRN Dorene Grebe, MD      ? hydrALAZINE (APRESOLINE) tablet 50 mg  50 mg Oral Q8H de Yolanda Manges,  Cortney E, NP   50 mg at 05/24/21 1435  ? insulin aspart (novoLOG) injection 0-9 Units  0-9 Units Subcutaneous TID AC & HS Janine Ores, NP   3 Units at 05/24/21 1214  ? insulin aspart (novoLOG) injection 3 Units  3 Units Subcutaneous TID AC & HS Janine Ores, NP      ? lisinopril (ZESTRIL) tablet 10 mg  10 mg Oral Daily de Yolanda Manges, Cortney E, NP   10 mg at 05/24/21 1213  ? MEDLINE mouth rinse  15 mL Mouth Rinse q12n4p Dorene Grebe, MD   15 mL at 05/24/21 1214  ? multivitamin with minerals tablet 1 tablet  1 tablet Oral Daily de Yolanda Manges, Cortney E, NP   1 tablet at 05/24/21 1213  ? pantoprazole sodium (PROTONIX) 40 mg/20 mL oral suspension 40 mg  40 mg Oral QHS de Yolanda Manges, Cortney E, NP      ? pravastatin (PRAVACHOL) tablet 20 mg  20 mg Oral q1800 de Yolanda Manges, Cortney E, NP      ? senna-docusate (Senokot-S) tablet 1 tablet  1 tablet Per Tube QHS PRN Maryjane Hurter, MD      ? thiamine tablet 100 mg  100 mg Oral Daily de Yolanda Manges, Cortney E, NP   100 mg at 05/24/21 1213  ? ? ? ?Discharge Medications: ?Please see discharge summary for a list of discharge medications. ? ?Relevant Imaging Results: ? ?Relevant Lab Results: ? ? ?Additional Information ?SS#: [id number] ? ?Pollie Friar, RN ?I have personally obtained history,examined this patient, reviewed notes, independently viewed imaging studies, participated in medical decision making and plan of care.ROS completed by me personally and pertinent positives fully documented  I have made any additions or clarifications directly to the above note. Agree with note above.   ? ?Antony Contras, MD ?Medical Director ?[facility] ?Pager: [phone] ?05/24/2021 4:23 PM ? ? ? ?

## 2021-05-24 NOTE — Progress Notes (Signed)
Physical Therapy Treatment ?Patient Details ?Name: Anthony Ferguson ?MRN: [id number] ?DOB: Mar 13, 1950 ?Today's Date: 05/24/2021 ? ? ?History of Present Illness Pt is 71 y.o. male presented to [facility] ED 3/31 with s/s stroke. CT head revealed left MCA M1 occlusion. Pt transfered to [facility] for emergent mechanical thrombectomy. Intubated for procedure, extubated same day.  PMH HIV, HTN, hypercholesterolemia diabetes type 2, + cocaine use. MRI positive for acute infarcts in left corona radiata and basal ganglia with hemorrhage in posterior left basal ganglia. ? ?  ?PT Comments  ? ? Focus of session today was bed mobility, sitting balance, positional upright training, and STS tranfers. The patient tolerated well and shows improved cognitive processing ability as well as command following. He was originally mod-max assist for all sitting balance, but after multidirectional priming and significant cuing he was able to maintain unsupported sitting balance for a short time. He responds well to matching a therapist to correct his R sided lean for an external point of focus. He still has significant limitations due to R sided inattention and hemiplegia as well as overall functional strength and balance. Pt. Would benefit from skilled PT to continue to address his functional transfer ability, bed mobility, seated and standing balance, and gait when appropriate. Plan and discharge setting remains unchanged. Pt to follow acutely as appropriate.  ?   ?Recommendations for follow up therapy are one component of a multi-disciplinary discharge planning process, led by the attending physician.  Recommendations may be updated based on patient status, additional functional criteria and insurance authorization. ? ?Follow Up Recommendations ? Acute inpatient rehab (3hours/day) ?  ?  ?Assistance Recommended at Discharge Frequent or constant Supervision/Assistance  ?Patient can return home with the following Two people to help with walking and/or  transfers;Two people to help with bathing/dressing/bathroom;Assistance with cooking/housework;Direct supervision/assist for financial management;Help with stairs or ramp for entrance;Direct supervision/assist for medications management;Assist for transportation ?  ?Equipment Recommendations ? Other (comment) (TBD)  ?  ?Recommendations for Other Services Rehab consult ? ? ?  ?Precautions / Restrictions Precautions ?Precautions: Fall ?Precaution Comments: R inattention, R pushing ?Restrictions ?Weight Bearing Restrictions: No  ?  ? ?Mobility ? Bed Mobility ?Overal bed mobility: Needs Assistance ?Bed Mobility: Supine to Sit, Sit to Supine ?  ?  ?Supine to sit: +2 for physical assistance, Max assist ?Sit to supine: +2 for physical assistance, Max assist ?  ?General bed mobility comments: Able to initiate movements with L UE/LE, requires total for any movement of the R limbs. Able to follow sequcening cues. ?  ? ?Transfers ?Overall transfer level: Needs assistance ?Equipment used: 2 person hand held assist ?Transfers: Sit to/from Stand ?Sit to Stand: Mod assist, +2 physical assistance ?  ?  ?  ?  ?  ?General transfer comment: Stood with cue to lean to the L PT, with SPT facilitating R sided glute and quad ?  ? ?Ambulation/Gait ?  ?  ?  ?  ?  ?  ?  ?  ? ? ?Stairs ?  ?  ?  ?  ?  ? ? ?Wheelchair Mobility ?  ? ?Modified Rankin (Stroke Patients Only) ?Modified Rankin (Stroke Patients Only) ?Pre-Morbid Rankin Score: No symptoms ?Modified Rankin: Severe disability ? ? ?  ?Balance Overall balance assessment: Needs assistance ?Sitting-balance support: Feet supported, Single extremity supported ?Sitting balance-Leahy Scale: Poor ?Sitting balance - Comments: Pt. with R sided and posterior lean that requires mod-max PT support to maintaing sitting balance. After significant directional priming and midline training he  was able to reach sitting balance unsupported for ~30 sec. Able to lean anteriorly/posterioly when cued during EOB  activites. ?Postural control: Posterior lean, Right lateral lean ?Standing balance support: Bilateral upper extremity supported ?Standing balance-Leahy Scale: Zero ?Standing balance comment: Requires mod A x2, glute and quad facilitation, and cues to maintain standing balance ?  ?  ?  ?  ?  ?  ?  ?  ?  ?  ?  ?  ? ?  ?Cognition Arousal/Alertness: Awake/alert ?Behavior During Therapy: [facility] for tasks assessed/performed ?Overall Cognitive Status: Impaired/Different from baseline ?Area of Impairment: Orientation, Attention, Following commands, Problem solving, Awareness, Safety/judgement ?  ?  ?  ?  ?  ?  ?  ?  ?  ?Current Attention Level: Sustained ?  ?Following Commands: Follows one step commands consistently, Follows multi-step commands inconsistently ?Safety/Judgement: Decreased awareness of safety, Decreased awareness of deficits ?Awareness: Emergent ?Problem Solving: Slow processing, Decreased initiation, Requires verbal cues, Requires tactile cues, Difficulty sequencing ?General Comments: unaware of R midline shift and unable to correct automatically. He is able to follow cues to do things to his L UE/LE with his R UE. Can attend when cued. ?  ?  ? ?  ?Exercises General Exercises - Lower Extremity ?Long Arc Quad: AROM, Left, 5 reps ?Other Exercises ?Other Exercises: Truncal rotations with R hand supported by L hand. PT perfroming tacticle cues at hips while pt instructed to pivot and reach toward SPT hands x6 ?Other Exercises: Anterior <> posterior leaning x 6 rounds ?Other Exercises: Propped on L elbow to seated midline (cued to match SPT), then cued ant/post lean to reach stabilized balance x 6 rounds, attempted propped on R side required max A +1 to reach midline balance ? ?  ?General Comments General comments (skin integrity, edema, etc.): Once primed for directional leaning and towards midline the pt shows a good understanding of cues and is able to follow them throughout EOB sitting. ?  ?  ? ?Pertinent  Vitals/Pain Pain Assessment ?Pain Assessment: No/denies pain  ? ? ?Home Living   ?  ?  ?  ?  ?  ?  ?  ?  ?  ?   ?  ?Prior Function    ?  ?  ?   ? ?PT Goals (current goals can now be found in the care plan section) Acute Rehab PT Goals ?Patient Stated Goal: to get stronger ?PT Goal Formulation: With patient ?Time For Goal Achievement: 06/05/21 ?Potential to Achieve Goals: Good ?Additional Goals ?Additional Goal #1: R LE strength to 3/5 to assist with transfers ?Progress towards PT goals: Progressing toward goals ? ?  ?Frequency ? ? ? Min 4X/week ? ? ? ?  ?PT Plan Current plan remains appropriate  ? ? ?Co-evaluation   ?  ?  ?  ?  ? ?  ?AM-PAC PT "6 Clicks" Mobility   ?Outcome Measure ? Help needed turning from your back to your side while in a flat bed without using bedrails?: A Lot ?Help needed moving from lying on your back to sitting on the side of a flat bed without using bedrails?: A Lot ?Help needed moving to and from a bed to a chair (including a wheelchair)?: Total ?Help needed standing up from a chair using your arms (e.g., wheelchair or bedside chair)?: Total ?Help needed to walk in hospital room?: Total ?Help needed climbing 3-5 steps with a railing? : Total ?6 Click Score: 8 ? ?  ?End of Session Equipment Utilized During  Treatment: Gait belt ?Activity Tolerance: Patient tolerated treatment well ?Patient left: with call bell/phone within reach;in bed;with bed alarm set ?Nurse Communication: Mobility status ?PT Visit Diagnosis: Other abnormalities of gait and mobility (R26.89);Hemiplegia and hemiparesis;Other symptoms and signs involving the nervous system (R29.898) ?Hemiplegia - Right/Left: Right ?Hemiplegia - dominant/non-dominant: Non-dominant ?Hemiplegia - caused by: Cerebral infarction ?  ? ? ?Time: [id number] ?PT Time Calculation (min) (ACUTE ONLY): 26 min ? ?Charges:  $Therapeutic Activity: 8-22 mins ?$Neuromuscular Re-education: 8-22 mins          ?          ? ?Lorie ApleyJustin Rayshawn Maney, SPT ?Acute Rehab  Services ? ? ? ?Lorie ApleyJustin Diannia Hogenson ?05/24/2021, 4:24 PM ? ?

## 2021-05-25 ENCOUNTER — Other Ambulatory Visit (HOSPITAL_COMMUNITY): Payer: Self-pay

## 2021-05-25 ENCOUNTER — Inpatient Hospital Stay (HOSPITAL_COMMUNITY): Payer: Medicare HMO

## 2021-05-25 DIAGNOSIS — I63512 Cerebral infarction due to unspecified occlusion or stenosis of left middle cerebral artery: Secondary | ICD-10-CM | POA: Diagnosis not present

## 2021-05-25 LAB — GLUCOSE, CAPILLARY
Glucose-Capillary: 177 mg/dL — ABNORMAL HIGH (ref 70–99)
Glucose-Capillary: 201 mg/dL — ABNORMAL HIGH (ref 70–99)
Glucose-Capillary: 248 mg/dL — ABNORMAL HIGH (ref 70–99)
Glucose-Capillary: 134 mg/dL — ABNORMAL HIGH (ref 70–99)

## 2021-05-25 MED ORDER — DILTIAZEM HCL 30 MG PO TABS: 30.0000 mg | ORAL_TABLET | Freq: Three times a day (TID) | ORAL | Status: DC

## 2021-05-25 MED ORDER — APIXABAN 5 MG PO TABS
5.0000 mg | ORAL_TABLET | Freq: Two times a day (BID) | ORAL | Status: DC
Start: 2021-05-25 — End: 2021-05-26
  Administered 2021-05-25 – 2021-05-26 (×2): 5 mg via ORAL
  Filled 2021-05-25 (×2): qty 1

## 2021-05-25 MED ORDER — FOLIC ACID 1 MG PO TABS: 1.0000 mg | ORAL_TABLET | Freq: Every day | ORAL | Status: AC

## 2021-05-25 MED ORDER — VITAL AF 1.2 CAL PO LIQD: 1000.0000 mL | ORAL | Status: DC

## 2021-05-25 MED ORDER — THIAMINE HCL 100 MG PO TABS: 100.0000 mg | ORAL_TABLET | Freq: Every day | ORAL | Status: AC

## 2021-05-25 MED ORDER — INSULIN ASPART 100 UNIT/ML IJ SOLN
0.0000 [IU] | Freq: Three times a day (TID) | INTRAMUSCULAR | Status: DC
  Administered 2021-05-25 (×2): 3 [IU] via SUBCUTANEOUS
  Administered 2021-05-25 – 2021-05-26 (×2): 1 [IU] via SUBCUTANEOUS

## 2021-05-25 MED ORDER — AMLODIPINE BESYLATE 10 MG PO TABS: 10.0000 mg | ORAL_TABLET | Freq: Every day | ORAL | Status: DC

## 2021-05-25 MED ORDER — INSULIN ASPART 100 UNIT/ML IJ SOLN
3.0000 [IU] | Freq: Three times a day (TID) | INTRAMUSCULAR | Status: DC
  Administered 2021-05-25 – 2021-05-26 (×4): 3 [IU] via SUBCUTANEOUS

## 2021-05-25 MED ORDER — INSULIN ASPART 100 UNIT/ML IJ SOLN: 3.0000 [IU] | Freq: Three times a day (TID) | INTRAMUSCULAR | 11 refills | Status: DC

## 2021-05-25 MED ORDER — HYDRALAZINE HCL 50 MG PO TABS
50.0000 mg | ORAL_TABLET | Freq: Three times a day (TID) | ORAL | Status: DC
Start: 2021-05-25 — End: 2022-07-16

## 2021-05-25 MED ORDER — GLUCERNA SHAKE PO LIQD
237.0000 mL | Freq: Three times a day (TID) | ORAL | 0 refills | Status: DC
Start: 2021-05-25 — End: 2022-08-10

## 2021-05-25 MED ORDER — ADULT MULTIVITAMIN W/MINERALS CH
1.0000 | ORAL_TABLET | Freq: Every day | ORAL | Status: DC
Start: 2021-05-26 — End: 2022-07-13

## 2021-05-25 MED ORDER — PANTOPRAZOLE SODIUM 40 MG PO PACK
40.0000 mg | PACK | Freq: Every day | ORAL | Status: DC
Start: 2021-05-25 — End: 2022-07-13

## 2021-05-25 MED ORDER — CHLORHEXIDINE GLUCONATE 0.12 % MT SOLN: 15.0000 mL | Freq: Two times a day (BID) | OROMUCOSAL | 0 refills | Status: AC

## 2021-05-25 MED ORDER — BICTEGRAVIR-EMTRICITAB-TENOFOV 50-200-25 MG PO TABS
1.0000 | ORAL_TABLET | Freq: Every day | ORAL | 1 refills | Status: DC
  Filled 2021-05-25: qty 30, 30d supply, fill #0

## 2021-05-25 MED ORDER — SENNOSIDES-DOCUSATE SODIUM 8.6-50 MG PO TABS: 1.0000 | ORAL_TABLET | Freq: Every evening | ORAL | Status: DC | PRN

## 2021-05-25 MED ORDER — LISINOPRIL 10 MG PO TABS
10.0000 mg | ORAL_TABLET | Freq: Every day | ORAL | Status: DC
Start: 2021-05-26 — End: 2022-07-13

## 2021-05-25 NOTE — Plan of Care (Signed)
?  Problem: Nutrition: ?Goal: Risk of aspiration will decrease ?Outcome: Progressing ?Goal: Dietary intake will improve ?Outcome: Progressing ?  ?Problem: Education: ?Goal: Knowledge of General Education information will improve ?Description: Including pain rating scale, medication(s)/side effects and non-pharmacologic comfort measures ?Outcome: Progressing ?  ?Problem: Health Behavior/Discharge Planning: ?Goal: Ability to manage health-related needs will improve ?Outcome: Progressing ?  ?

## 2021-05-25 NOTE — TOC Benefit Eligibility Note (Signed)
Patient Advocate Encounter  Insurance verification completed.    The patient is currently admitted and upon discharge could be taking Eliquis 5 mg.  The current 30 day co-pay is, $0.00.   The patient is insured through [employer] Medicare Part D     Russie Gulledge, CPhT Pharmacy Patient Advocate Specialist [facility] Patient Advocate Team Direct Number: [phone]  Fax: [phone]        

## 2021-05-25 NOTE — Progress Notes (Addendum)
STROKE TEAM PROGRESS NOTE  ? ?INTERVAL HISTORY ?Patient is seen in his room with no family at the bedside. Rehab thinks he is more appropriate for SNF now. SSI adjusted to ACHS with CBGs. Awaiting CIR placement and family comfirmation.  ?Vital signs are stable.  Neurological exam is unchanged.  CT head done this morning shows stable appearance of the left MCA infarct without any increasing hemorrhagic transformation ?Vitals:  ? 05/24/21 2021 05/24/21 2330 05/25/21 0341 05/25/21 0500  ?BP: (!) 144/80 (!) 151/80 133/82   ?Pulse: 82 79 79   ?Resp: 16 20 20    ?Temp: 98.6 ?F (37 ?C) 99.1 ?F (37.3 ?C) 97.7 ?F (36.5 ?C)   ?TempSrc: Oral Oral Oral   ?SpO2: 99% 100% 99%   ?Weight:    73 kg  ?Height:      ? ?CBC:  ?Recent Labs  ?Lab 05/19/21 ?2208 05/20/21 ?0228 05/21/21 ?[id number] 05/22/21 ?0435 05/23/21 ?0225  ?WBC 3.3* 2.8*   < > 5.3 5.3  ?NEUTROABS 2.2 1.8  --   --   --   ?HGB 13.6 12.6*   < > 13.2 13.7  ?HCT 41.0 36.1*   < > 38.6* 39.3  ?MCV 91.7 89.8   < > 89.6 89.5  ?PLT 134* 126*   < > 129* 125*  ? < > = values in this interval not displayed.  ? ?Basic Metabolic Panel:  ?Recent Labs  ?Lab 05/22/21 ?0435 05/22/21 ?1646 05/23/21 ?0225  ?NA 135  --  133*  ?K 3.9  --  3.9  ?CL 104  --  103  ?CO2 25  --  21*  ?GLUCOSE 198*  --  112*  ?BUN 9  --  13  ?CREATININE 0.92  --  0.80  ?CALCIUM 8.8*  --  8.8*  ?MG 1.9 2.0  --   ?PHOS 3.1 3.3  --   ? ?Lipid Panel:  ?Recent Labs  ?Lab 05/20/21 ?0228  ?CHOL 141  ?TRIG 37  ?HDL 69  ?CHOLHDL 2.0  ?VLDL 7  ?[place] 65  ? ?HgbA1c:  ?Recent Labs  ?Lab 05/20/21 ?0228  ?HGBA1C 7.9*  ? ?Urine Drug Screen:  ?Recent Labs  ?Lab 05/19/21 ?2217  ?LABOPIA NONE DETECTED  ?COCAINSCRNUR POSITIVE*  ?LABBENZ NONE DETECTED  ?AMPHETMU NONE DETECTED  ?THCU NONE DETECTED  ?LABBARB NONE DETECTED  ?  ?Alcohol Level  ?Recent Labs  ?Lab 05/19/21 ?2208  ?ETH <10  ? ? ?IMAGING past 24 hours ?No results found. ? ?PHYSICAL EXAM ?General:  Thin-appearing African American patient in no acute distress.   ?Respiratory:   Respirations regular and unlabored ? ? ?NEURO:  ?Mental Status: AA&Ox2 ?Speech/Language: speech is without dysarthria or aphasia.  Naming, fluency, and comprehension intact. ? ?Cranial Nerves:  ?II: PERRL. Visual fields full, but right sided visual neglect present ?III, IV, VI: EOMI. Eyelids elevate symmetrically. Left gaze preference but can cross midline ?V: Sensation is intact to light touch and symmetrical to face.  ?VII: Smile is symmetrical.  ?VIII: hearing intact to voice. ?IX, X: Phonation is normal.  ?XII: tongue is midline without fasciculations. ?Motor: Dense hemiplegia, right paresis,  ?5/5 strength to LUE and LLE, 0/5 to RUE, 2/5 to RLE  ?Tone: is normal and bulk is normal ?Sensation- Intact to light touch bilaterally, but decrease right sensation  ?Gait- deferred ? ? ?ASSESSMENT/PLAN ?[name] is a 71 y.o. male with history of HTN, HIV, HLD,  DM2 and Eliquis use for atrial fibrillation presenting after he fell out of his chair while  drinking outside and noticed a right sided facial droop.  He then presented to [facility] and was found to have right sided weakness and speech impairments.  CAT showed left hyperdense MCA sign.  Patient was found to lave a left MCA M1 occlusion and was taken for mechanical thrombectomy.  This was successful with TICI 3 flow achieved.  He was extubated on 3/31.  MRI 4/1 shows acute infarcts in left corona radiata, punctate infarcts in left frontal lobe and left temporal lobe and basal ganglia with hemorrhage in posterior left basal ganglia.  Repeat head CT is scheduled for 4/5 prior to resuming [place] ? ?Stroke:  left MCA M1 infarct likely secondary cardio embolic source in setting of known atrial fibrillation with unclear Eliquis compliance as well as cocaine abuse s/p thrombectomy with TICI 3 revascularization ?CT head No acute abnormality. Age-indeterminate right nasal bone fracture ?CTA head & neck occlusive thrombus in proximal-mid left MCA M1  segment, moderate to severe stenosis of origin of left vertebral artery ?CT perfusion 150 cc penumbra, no core ?Post IR CT no hemorrhage ?MRI  acute infarcts in left corona radiata, punctate infarcts in left frontal lobe and left temporal lobe and basal ganglia with hemorrhage in posterior left basal ganglia ?2D Echo EF 55-60%, interatrial septum not well visualized ?LDL 65 ?HgbA1c 7.9 ?VTE prophylaxis - SCDs ?   ?Diet  ? DIET - DYS 1 Room service appropriate? Yes with Assist; Fluid consistency: Thin  ? ?Eliquis (apixaban) daily with unclear compliance prior to admission, now on ASA 81mg  until we are able to safely resume [place].  ?Therapy recommendations:  CIR ?Disposition:  pending ? ?Hypertension ?Home meds:  none ?Stable, no longer requiring cleviprex ? amlodipine 10 mg daily, hydralazine 50mg  q8hrs, zestril 10mg  daily ?Keep SBP 120-160  ?Long-term BP goal normotensive ? ?Hyperlipidemia ?Home meds:  pravastatin 20 mg daily, restarted in hospital ?LDL 65, goal < 70 ?High intensity statin not indicated as LDL below goal ?Continue statin at discharge ? ?Diabetes type II Uncontrolled ?Home meds:  glipzide 10 mg daily ?HgbA1c 7.9, goal < 7.0 ?CBGs ?Recent Labs  ?  05/24/21 ?1601 05/24/21 ?2110 05/25/21 ?[id number]  ?GLUCAP 259* 102* 177*  ?  ?SSI ? ?HIV infection ?On home [place]  ? ?History of atrial fibrillation ?History of atrial fibrillation ?On home Eliquis, unclear compliance ?Cardizem 30 mg q8h ?Head CT scheduled for 4/5 and consider resuming anticoagulation then ? ?Respiratory Failure ?Left intubated after thrombectomy ?Ventilator management per CCM ?Extubated 3/31 ? ?Other Stroke Risk Factors ?Advanced Age >/= 57  ?Cigarette smoker will advise to stop smoking ?Substance abuse - UDS:  THC NONE DETECTED, Cocaine POSITIVE. Patient advised to stop using due to stroke risk. ?Coronary artery disease ? ?Other Active Problems ? ?  Patient appears medically stable to be transferred for rehab but inpatient rehab team feels  patient may not have adequate caregiver support and SNF may also be an option to be considered.  Plan resume Eliquis anticoagulation and stop aspirin greater than 50% time during this 35-minute visit was spent in counseling and coordination of care and discussion with patient and care team and answering questions  and  plan for rehabilitation ? ?Antony Contras, MD ? ? ?To contact Stroke Continuity provider, please refer to [email or web address] ?After hours, contact General Neurology  ?

## 2021-05-25 NOTE — TOC Progression Note (Addendum)
Transition of Care (TOC) - Progression Note  ? ? ?Patient Details  ?Name: Anthony Ferguson ?MRN: [id number] ?Date of Birth: 1950-07-26 ? ?Transition of Care (TOC) CM/SW Contact  ?Pollie Friar, RN ?Phone Number: ?05/25/2021, 2:43 PM ? ?Clinical Narrative:    ?Patient only has one bed offer for SNF rehab. [facility] is agreeable to [facility]. CM has asked TOC MOA to start insurance. CM has updated patient's friend Jeneen Rinks with patients permission.  ?TOC following. ? ?1542: Pt received another bed offer and its in [place]. Pt prefers to d/c to [place] so auth changed to [employer] ([place]).  ? ? ?Expected Discharge Plan: [place] ?Barriers to Discharge: Ship broker, Active Substance Use - Placement, SNF Pending bed offer ? ?Expected Discharge Plan and Services ?Expected Discharge Plan: [place] ?In-house Referral: Clinical Social Work ?Discharge Planning Services: CM Consult ?Post Acute Care Choice: [place] ?Living arrangements for the past 2 months: Apartment ?                ?  ?  ?  ?  ?  ?  ?  ?  ?  ?  ? ? ?Social Determinants of Health (SDOH) Interventions ?  ? ?Readmission Risk Interventions ?   ? View : No data to display.  ?  ?  ?  ? ? ?

## 2021-05-25 NOTE — Discharge Summary (Addendum)
Patient ID: Anthony Ferguson    l ?  MRN: [id number]    ?  DOB: [birth year] ? ?Date of Admission: 05/19/2021 ?Date of Discharge: 05/26/2021 ? ?Attending Physician:  Stroke, Md, MD, Stroke MD ?Consultant(s):    pulmonary/intensive care Meier, Hortencia Conradi, MD ? ?Patient's PCP:  Patient, No Pcp Per (Inactive) ? ?DISCHARGE DIAGNOSIS:  ? ?Principal Problem: ?Acute ischemic left MCA stroke [facility]): left MCA M1 infarct likely secondary cardio embolic source in setting of known atrial fibrillation with unclear Eliquis compliance as well as cocaine abuse s/p thrombectomy with TICI 3 revascularization ?Right hemiplegia ? ?Active Problems: ?Middle cerebral artery embolism, left ?Protein-calorie malnutrition, severe ?Substance abuse ?Anticoagulation ?Atrial Fibrillation ?HIV infection ?Hyperlipidemia ?Type 2 Diabetes ?Hypertension ?Tobacco use ?Hyponatremia ? ? ? ?Past Medical History:  ?Diagnosis Date  ? HIV (human immunodeficiency virus infection) ([place])   ? Hypercholesteremia   ? Hypertension   ? Type 2 diabetes mellitus ([place])   ? ?Past Surgical History:  ?Procedure Laterality Date  ? CARDIAC SURGERY    ? CIRCUMCISION    ? CORONARY ARTERY BYPASS GRAFT    ? IR CT HEAD LTD  05/20/2021  ? IR PERCUTANEOUS ART THROMBECTOMY/INFUSION INTRACRANIAL INC DIAG ANGIO  05/20/2021  ? RADIOLOGY WITH ANESTHESIA N/A 05/19/2021  ? Procedure: IR WITH ANESTHESIA;  Surgeon: Luanne Bras, MD;  Location: [facility];  Service: Radiology;  Laterality: N/A;  ? ? ?Family History ?Family History  ?Problem Relation Age of Onset  ? Diabetes Mother   ? Heart failure Mother   ? ? ?Social History ? reports that he has been smoking cigarettes. He has been smoking an average of .3 packs per day. He has never used smokeless tobacco. He reports current alcohol use of about 5.0 standard drinks per week. He reports that he does not currently use drugs after having used the following drugs: Cocaine. ? ?Allergies as of 05/25/2021   ? ?   Reactions  ? Hctz [hydrochlorothiazide]  Other (See Comments)  ? "ran my sugar up"  ? Pork-derived Products Palpitations, Hypertension  ? ?  ? ?  ?Medication List  ?  ? ?STOP taking these medications   ? ?glipiZIDE 10 MG 24 hr tablet ?Commonly known as: GLUCOTROL XL ?  ? ?  ? ?TAKE these medications   ? ?amLODipine 10 MG tablet ?Commonly known as: NORVASC ?Take 1 tablet (10 mg total) by mouth daily. ?Start taking on: May 26, 2021 ?  ?apixaban 5 MG Tabs tablet ?Commonly known as: ELIQUIS ?Take 1 tablet (5 mg total) by mouth 2 (two) times daily. ?  ?bictegravir-emtricitabine-tenofovir AF 50-200-25 MG Tabs tablet ?Commonly known as: BIKTARVY ?Take 1 tablet by mouth daily. ?Start taking on: May 26, 2021 ?  ?chlorhexidine 0.12 % solution ?Commonly known as: PERIDEX ?15 mLs by Mouth Rinse route 2 (two) times daily. ?  ?diltiazem 30 MG tablet ?Commonly known as: CARDIZEM ?Take 1 tablet (30 mg total) by mouth every 8 (eight) hours. ?  ?feeding supplement (GLUCERNA SHAKE) Liqd ?Take 237 mLs by mouth 3 (three) times daily between meals. ?  ?folic acid 1 MG tablet ?Commonly known as: FOLVITE ?Take 1 tablet (1 mg total) by mouth daily. ?Start taking on: May 26, 2021 ?  ?glucose blood test strip ?Use as instructed ?  ?hydrALAZINE 50 MG tablet ?Commonly known as: APRESOLINE ?Take 1 tablet (50 mg total) by mouth every 8 (eight) hours. ?  ?insulin aspart 100 UNIT/ML injection ?Commonly known as: novoLOG ?Inject 3 Units into the skin 4 (  four) times daily -  before meals and at bedtime. ?  ?lisinopril 10 MG tablet ?Commonly known as: ZESTRIL ?Take 1 tablet (10 mg total) by mouth daily. ?Start taking on: May 26, 2021 ?  ?multivitamin with minerals Tabs tablet ?Take 1 tablet by mouth daily. ?Start taking on: May 26, 2021 ?  ?omega-3 acid ethyl esters 1 g capsule ?Commonly known as: LOVAZA ?Take 1 capsule by mouth 2 (two) times daily. ?  ?pantoprazole sodium 40 mg ?Commonly known as: PROTONIX ?Take 40 mg by mouth at bedtime. ?  ?pravastatin 20 MG tablet ?Commonly known  as: PRAVACHOL ?Take 1 tablet (20 mg total) by mouth daily. ?  ?senna-docusate 8.6-50 MG tablet ?Commonly known as: Senokot-S ?Place 1 tablet into feeding tube at bedtime as needed for moderate constipation or mild constipation. ?  ?thiamine 100 MG tablet ?Take 1 tablet (100 mg total) by mouth daily. ?Start taking on: May 26, 2021 ?  ? ?  ? ? ?[place] ?Medications Prior to Admission  ?Medication Sig Dispense Refill  ? apixaban (ELIQUIS) 5 MG TABS tablet Take 1 tablet (5 mg total) by mouth 2 (two) times daily. (Patient not taking: Reported on 12/17/2019) 60 tablet 0  ? BIKTARVY 50-200-25 MG TABS tablet TAKE 1 TABLET EVERY DAY. 30 tablet 3  ? glipiZIDE (GLUCOTROL XL) 10 MG 24 hr tablet Take 1 tablet (10 mg total) by mouth daily with breakfast. (Patient not taking: Reported on 05/21/2021) 30 tablet 3  ? glucose blood test strip Use as instructed 100 each 12  ? omega-3 acid ethyl esters (LOVAZA) 1 g capsule Take 1 capsule by mouth 2 (two) times daily. (Patient not taking: Reported on 12/17/2019)    ? pravastatin (PRAVACHOL) 20 MG tablet Take 1 tablet (20 mg total) by mouth daily. (Patient not taking: Reported on 12/17/2019) 90 tablet 1  ? ? ? ?HOSPITAL MEDICATIONS ?  stroke: mapping our early stages of recovery book   Does not apply Once  ? amLODipine  10 mg Oral Daily  ? apixaban  5 mg Oral BID  ? bictegravir-emtricitabine-tenofovir AF  1 tablet Oral Daily  ? chlorhexidine  15 mL Mouth Rinse BID  ? diltiazem  30 mg Oral Q8H  ? feeding supplement (GLUCERNA SHAKE)  237 mL Oral TID BM  ? folic acid  1 mg Oral Daily  ? hydrALAZINE  50 mg Oral Q8H  ? insulin aspart  0-9 Units Subcutaneous TID AC & HS  ? insulin aspart  3 Units Subcutaneous TID AC & HS  ? lisinopril  10 mg Oral Daily  ? mouth rinse  15 mL Mouth Rinse q12n4p  ? multivitamin with minerals  1 tablet Oral Daily  ? pravastatin  20 mg Oral q1800  ? thiamine  100 mg Oral Daily  ? ? ?LABORATORY STUDIES ?CBC ?   ?Component Value Date/Time  ?  WBC 5.3 05/23/2021 0225  ? RBC 4.39 05/23/2021 0225  ? HGB 13.7 05/23/2021 0225  ? HCT 39.3 05/23/2021 0225  ? PLT 125 (L) 05/23/2021 0225  ? MCV 89.5 05/23/2021 0225  ? MCH 31.2 05/23/2021 0225  ? MCHC 34.9 05/23/2021 0225  ? RDW 13.8 05/23/2021 0225  ? LYMPHSABS 0.7 05/20/2021 0228  ? MONOABS 0.2 05/20/2021 0228  ? EOSABS 0.0 05/20/2021 0228  ? BASOSABS 0.0 05/20/2021 0228  ? ?CMP ?   ?Component Value Date/Time  ? NA 133 (L) 05/23/2021 0225  ? K 3.9 05/23/2021 0225  ? CL 103 05/23/2021 0225  ?  CO2 21 (L) 05/23/2021 0225  ? GLUCOSE 112 (H) 05/23/2021 0225  ? BUN 13 05/23/2021 0225  ? CREATININE 0.80 05/23/2021 0225  ? CREATININE 0.91 12/17/2019 0946  ? CALCIUM 8.8 (L) 05/23/2021 0225  ? PROT 6.7 12/17/2019 0946  ? ALBUMIN 3.6 10/12/2016 1215  ? AST 18 12/17/2019 0946  ? ALT 11 12/17/2019 0946  ? ALT 43 11/16/2016 1344  ? ALKPHOS 103 10/12/2016 1215  ? BILITOT 0.6 12/17/2019 0946  ? GFRNONAA >60 05/23/2021 0225  ? GFRNONAA 85 10/12/2016 1215  ? GFRAA >89 10/12/2016 1215  ? ?COAGS ?Lab Results  ?Component Value Date  ? INR 1.1 05/19/2021  ? INR 1.1 12/20/2015  ? INR 1.10 08/19/2013  ? ?Lipid Panel ?   ?Component Value Date/Time  ? CHOL 141 05/20/2021 0228  ? TRIG 37 05/20/2021 0228  ? HDL 69 05/20/2021 0228  ? CHOLHDL 2.0 05/20/2021 0228  ? VLDL 7 05/20/2021 0228  ? [place] 65 05/20/2021 0228  ? LDLCALC 77 12/17/2019 0946  ? ?HgbA1C  ?Lab Results  ?Component Value Date  ? HGBA1C 7.9 (H) 05/20/2021  ? ?Urinalysis ?   ?Component Value Date/Time  ? COLORURINE COLORLESS (A) 05/19/2021 2217  ? APPEARANCEUR CLEAR 05/19/2021 2217  ? LABSPEC 1.005 05/19/2021 2217  ? PHURINE 6.0 05/19/2021 2217  ? GLUCOSEU 50 (A) 05/19/2021 2217  ? GLUCOSEU NEG mg/dL 10/24/2007 2102  ? HGBUR SMALL (A) 05/19/2021 2217  ? [place] NEGATIVE 05/19/2021 2217  ? [place] NEGATIVE 05/19/2021 2217  ? [place] NEGATIVE 05/19/2021 2217  ? UROBILINOGEN 0.2 10/24/2007 2102  ? NITRITE NEGATIVE 05/19/2021 2217  ? LEUKOCYTESUR NEGATIVE 05/19/2021 2217   ? ?Urine Drug Screen  ?   ?Component Value Date/Time  ? [place] DETECTED 05/19/2021 2217  ? COCAINSCRNUR POSITIVE (A) 05/19/2021 2217  ? [place] DETECTED 05/19/2021 2217  ? AMPHETMU NONE DETEC

## 2021-05-25 NOTE — TOC CAGE-AID Note (Signed)
Transition of Care (TOC) - CAGE-AID Screening ? ? ?Patient Details  ?Name: Anthony Ferguson ?MRN: [id number] ?Date of Birth: 1950/11/19 ? ?Transition of Care (TOC) CM/SW Contact:    ?Kermit Balo, RN ?Phone Number: ?05/25/2021, 10:52 AM ? ? ?Clinical Narrative: ?Patient provided resources for inpatient/ outpatient alcohol and drug counseling. ? ? ?CAGE-AID Screening: ?Substance Abuse Screening unable to be completed due to: : Patient unable to participate ? ?Have You Ever Felt You Ought to Cut Down on Your Drinking or Drug Use?: Yes ?Have People Annoyed You By Critizing Your Drinking Or Drug Use?: No ?Have You Felt Bad Or Guilty About Your Drinking Or Drug Use?: No ?Have You Ever Had a Drink or Used Drugs First Thing In The Morning to Steady Your Nerves or to Get Rid of a Hangover?: No ?CAGE-AID Score: 1 ? ?Substance Abuse Education Offered: Yes ? ?Substance abuse interventions: Patient Counseling, Educational Materials ? ? ? ? ? ? ?

## 2021-05-25 NOTE — Progress Notes (Addendum)
Speech Language Pathology Treatment: Cognitive-Linquistic  ?Patient Details ?Name: Anthony Ferguson ?MRN: [id number] ?DOB: 01-Apr-1950 ?Today's Date: 05/25/2021 ?Time: [id number] ?SLP Time Calculation (min) (ACUTE ONLY): 12 min ? ?Assessment / Plan / Recommendation ?Clinical Impression ? Pt alert and seen for communication this morning. His aphasia appears to have mostly resolved and impairment of focus is his dysarthria. Verbalizes in conversation level with decreased vocal intensity with approximately 70% intelligible in conversation. He needed moderate reminders to increase volume as if he is talking to someone across the room. He says he speaks softly at baseline. He was able to explain a common activity to therapist with appropriate language needing cues to state more details. He named objects with 100% accuracy. Therapy will focus on dysarthria and cognition. Briefly reviewed need to check right side mouth for pocketed food.  ?  ?HPI HPI: 71 y.o. male presented to [facility] ED 3/31 with s/s stroke. CT head revealed left MCA M1 occlusion. Pt transfered to [facility] [address] for emergent mechanical thrombectomy. Intubated for procedure, extubated same day.  PMH HIV, HTN, hypercholesterolemia diabetes type 2, + cocaine use. ?  ?   ?SLP Plan ? Continue with current plan of care ? ?  ?  ?Recommendations for follow up therapy are one component of a multi-disciplinary discharge planning process, led by the attending physician.  Recommendations may be updated based on patient status, additional functional criteria and insurance authorization. ?  ? ?Recommendations  ?   ?   ?    ?   ? ? ? ? Oral Care Recommendations: Oral care BID ?Follow Up Recommendations: Home health SLP ?Assistance recommended at discharge: Frequent or constant Supervision/Assistance ?SLP Visit Diagnosis: Dysarthria and anarthria (R47.1);Dysphagia, oropharyngeal phase (R13.12) ?Plan: Continue with current plan of care ? ? ? ? ?  ?  ? ? ?Houston Siren ? ?05/25/2021, 1:22  PM ?

## 2021-05-25 NOTE — Progress Notes (Signed)
Inpatient Rehab Admissions Coordinator:  ? ?Per TOC, family unable to provide adequate caregiver support to facilitate a CIR admission and prefer SNF at this time.  CIR will sign off.  ? ?Estill Dooms, PT, DPT ?Admissions Coordinator ?[phone] ?05/25/21  ?10:59 AM ? ?

## 2021-05-25 NOTE — Progress Notes (Signed)
Inpatient Diabetes Program Recommendations ? ?AACE/ADA: New Consensus Statement on Inpatient Glycemic Control (2015) ? ?Target Ranges:  Prepandial:   less than 140 mg/dL ?     Peak postprandial:   less than 180 mg/dL (1-2 hours) ?     Critically ill patients:  140 - 180 mg/dL  ? ?Lab Results  ?Component Value Date  ? GLUCAP 177 (H) 05/25/2021  ? HGBA1C 7.9 (H) 05/20/2021  ? ? ?Review of Glycemic Control ? Latest Reference Range & Units 05/24/21 11:13 05/24/21 16:01 05/24/21 21:10 05/25/21 06:25  ?Glucose-Capillary 70 - 99 mg/dL 462 (H) 703 (H) 500 (H) 177 (H)  ?(H): Data is abnormally high ?Diabetes history: Type 2 DM ?Outpatient Diabetes medications: none ?Current orders for Inpatient glycemic control: Novolog 3 units TID & HS, Novolog 0-9 units TID & HS ?  ?Inpatient Diabetes Program Recommendations:   ? ?Consider: ?-Discontinuing Novolog 3 units QHS ?-Adding Levemir 8 units QHS ? ?Thanks, ?Lujean Rave, MSN, RNC-OB ?Diabetes Coordinator ?[phone] (8a-5p) ? ? ? ? ?

## 2021-05-25 NOTE — Progress Notes (Signed)
Physical Therapy Treatment ?Patient Details ?Name: Anthony Ferguson ?MRN: [id number] ?DOB: Dec 13, 1950 ?Today's Date: 05/25/2021 ? ? ?History of Present Illness Pt is 71 y.o. male presented to [facility] ED 3/31 with s/s stroke. CT head revealed left MCA M1 occlusion. Pt transfered to [facility] for emergent mechanical thrombectomy. Intubated for procedure, extubated same day.  PMH HIV, HTN, hypercholesterolemia diabetes type 2, + cocaine use. MRI positive for acute infarcts in left corona radiata and basal ganglia with hemorrhage in posterior left basal ganglia. ? ?  ?PT Comments  ? ? Focus of session today was functional bed mobility, sitting balance, repeated standing in using the stedy, and standing weight shifting. The patient tolerated well.  Pt. Shows overall improvement with his upright tolerance, command following, and ability to find/maintain midline during stance. R hemiparesis, R sided preference during functional activities, static, and dynamic balance are still limiting function. Pt. Would benefit from skilled PT to continue to address his functional mobility, balance, transfer ability, and pre-gait. Plan and discharge setting needs to be updated to SNF and frequency lowered. Due to decreased at home support and pt/family wishes he no longer qualifies for AIR. PT to follow acutely as appropriate.  ?   ?Recommendations for follow up therapy are one component of a multi-disciplinary discharge planning process, led by the attending physician.  Recommendations may be updated based on patient status, additional functional criteria and insurance authorization. ? ?Follow Up Recommendations ? Skilled nursing-short term rehab (<3 hours/day) ?  ?  ?Assistance Recommended at Discharge Frequent or constant Supervision/Assistance  ?Patient can return home with the following Two people to help with walking and/or transfers;Two people to help with bathing/dressing/bathroom;Assistance with cooking/housework;Direct supervision/assist for  financial management;Help with stairs or ramp for entrance;Direct supervision/assist for medications management;Assist for transportation ?  ?Equipment Recommendations ? Other (comment) (defer to next venue)  ?  ?Recommendations for Other Services   ? ? ?  ?Precautions / Restrictions Precautions ?Precautions: Fall ?Precaution Comments: R inattention, R pushing ?Restrictions ?Weight Bearing Restrictions: No  ?  ? ?Mobility ? Bed Mobility ?Overal bed mobility: Needs Assistance ?Bed Mobility: Supine to Sit ?  ?  ?Supine to sit: Max assist, HOB elevated ?  ?  ?General bed mobility comments: Able to use L UE/LE but requires cuing to initiate transfer. Requires max A for most R extremity movements. Able to push up from elevated HOB and go over to L elbow when cued. Both verbal and tactile hip cuing used. ?Patient Response: Flat affect ? ?Transfers ?Overall transfer level: Needs assistance ?Equipment used:  (stedy) ?Transfers: Sit to/from Stand ?Sit to Stand: Mod assist ?  ?  ?  ?  ?  ?General transfer comment: With proper set up pt is able to perform stand with stedy. He still requires mod A for power up and to obtain standing balance. STS on stedy x5, eventually progressing to min A for power up and lower. Required R glute and quad facilitation throughout. ?Transfer via Lift Equipment: Stedy ? ?Ambulation/Gait ?  ?  ?  ?  ?  ?  ?  ?  ? ? ?Stairs ?  ?  ?  ?  ?  ? ? ?Wheelchair Mobility ?  ? ?Modified Rankin (Stroke Patients Only) ?Modified Rankin (Stroke Patients Only) ?Pre-Morbid Rankin Score: No symptoms ?Modified Rankin: Severe disability ? ? ?  ?Balance Overall balance assessment: Needs assistance ?Sitting-balance support: Feet supported, Single extremity supported ?Sitting balance-Leahy Scale: Poor ?Sitting balance - Comments: Pt. able to maintain sitting balance  with significant cuing and priming. He was able to use his L UE on the bed rail to prevent R sided drift and collapse. ?Postural control: Posterior lean,  Right lateral lean ?Standing balance support: Bilateral upper extremity supported ?Standing balance-Leahy Scale: Zero ?Standing balance comment: Requires steady for power up and mod A +2 to find upright. He was able to mimic an upright person as an external visual cue and mimic a stright line on a cabinet. He requires A for all weight shifting and heavily favors his right side. ?  ?  ?  ?  ?  ?  ?  ?  ?  ?  ?  ?  ? ?  ?Cognition Arousal/Alertness: Awake/alert ?Behavior During [facility] for tasks assessed/performed ?Overall Cognitive Status: Impaired/Different from baseline ?Area of Impairment: Attention, Following commands, Problem solving, Awareness, Safety/judgement ?  ?  ?  ?  ?  ?  ?  ?  ?  ?Current Attention Level: Sustained ?  ?Following Commands: Follows one step commands consistently, Follows multi-step commands inconsistently ?Safety/Judgement: Decreased awareness of safety, Decreased awareness of deficits ?Awareness: Emergent ?Problem Solving: Slow processing, Decreased initiation, Requires verbal cues, Requires tactile cues, Difficulty sequencing ?General Comments: Able to express that he is not upright during sitting and stance, but says that it is hard to correct. Follows one step commands well and responds well to external visual cuing. R in-attention still present, he can attend when cued ?  ?  ? ?  ?Exercises Other Exercises ?Other Exercises: Standing weight shifts while in stedy. Cues to maintain upright and then match a therapist with his shoulders. ? ?  ?General Comments General comments (skin integrity, edema, etc.): Shows improved ability to follow directional priming and maintain upright throughout the session. ?  ?  ? ?Pertinent Vitals/Pain Pain Assessment ?Pain Assessment: No/denies pain  ? ? ?Home Living   ?  ?  ?  ?  ?  ?  ?  ?  ?  ?   ?  ?Prior Function    ?  ?  ?   ? ?PT Goals (current goals can now be found in the care plan section) Acute Rehab PT Goals ?Patient Stated Goal: to get  stronger ?PT Goal Formulation: With patient ?Time For Goal Achievement: 06/05/21 ?Potential to Achieve Goals: Good ?Additional Goals ?Additional Goal #1: R LE strength to 3/5 to assist with transfers ?Progress towards PT goals: Progressing toward goals ? ?  ?Frequency ? ? ? Min 3X/week ? ? ? ?  ?PT Plan Current plan remains appropriate  ? ? ?Co-evaluation   ?  ?  ?  ?  ? ?  ?AM-PAC PT "6 Clicks" Mobility   ?Outcome Measure ? Help needed turning from your back to your side while in a flat bed without using bedrails?: A Lot ?Help needed moving from lying on your back to sitting on the side of a flat bed without using bedrails?: A Lot ?Help needed moving to and from a bed to a chair (including a wheelchair)?: Total ?Help needed standing up from a chair using your arms (e.g., wheelchair or bedside chair)?: A Lot ?Help needed to walk in hospital room?: Total ?Help needed climbing 3-5 steps with a railing? : Total ?6 Click Score: 9 ? ?  ?End of Session Equipment Utilized During Treatment: Gait belt;Other (comment) (stedy) ?Activity Tolerance: Patient tolerated treatment well ?Patient left: with call bell/phone within reach;in bed;with bed alarm set ?Nurse Communication: Mobility status ?PT Visit Diagnosis: Other abnormalities  of gait and mobility (R26.89);Hemiplegia and hemiparesis;Other symptoms and signs involving the nervous system (R29.898) ?Hemiplegia - Right/Left: Right ?Hemiplegia - dominant/non-dominant: Non-dominant ?Hemiplegia - caused by: Cerebral infarction ?  ? ? ?Time: 0962-8366 ?PT Time Calculation (min) (ACUTE ONLY): 27 min ? ?Charges:  $Therapeutic Activity: 8-22 mins ?$Neuromuscular Re-education: 8-22 mins          ?          ? ?Lorie Apley, SPT ?Acute Rehab Services ? ? ? ?Lorie Apley ?05/25/2021, 3:55 PM ? ?

## 2021-05-26 LAB — GLUCOSE, CAPILLARY
Glucose-Capillary: 140 mg/dL — ABNORMAL HIGH (ref 70–99)
Glucose-Capillary: 140 mg/dL — ABNORMAL HIGH (ref 70–99)

## 2021-05-26 NOTE — Progress Notes (Signed)
Occupational Therapy Treatment ?Patient Details ?Name: Anthony Ferguson ?MRN: [id number] ?DOB: Jan 17, 1951 ?Today's Date: 05/26/2021 ? ? ?History of present illness Pt is 71 y.o. male presented to [facility] ED 3/31 with s/s stroke. CT head revealed left MCA M1 occlusion. Pt transfered to [facility] for emergent mechanical thrombectomy. Intubated for procedure, extubated same day.  PMH HIV, HTN, hypercholesterolemia diabetes type 2, + cocaine use. MRI positive for acute infarcts in left corona radiata and basal ganglia with hemorrhage in posterior left basal ganglia. ?  ?OT comments ? Treatment session with focus on bed mobility, sitting balance, visual attention and scanning during grooming tasks while seated EOB.  Pt required max assist for sidelying <> sitting and multimodal cues and assist to locate RUE prior to mobility.  Pt requiring min-max assist for sitting balance due to R lean and ?pushing without UE support.  Pt demonstrating sustained attention to task with ability to engage in grooming tasks with max assist for sitting balance as he fatigued.  Pt with decreased visual attention or scanning to R to locate items until presented at midline.  Discharge setting updated to SNF as pt's family is unable to provide the necessary level of care and CIR signed off.  Pt will continue to benefit from OT services acutely to address above/below impairments.    ? ?Recommendations for follow up therapy are one component of a multi-disciplinary discharge planning process, led by the attending physician.  Recommendations may be updated based on patient status, additional functional criteria and insurance authorization. ?   ?Follow Up Recommendations ? Skilled nursing-short term rehab (<3 hours/day)  ?  ?Assistance Recommended at Discharge Frequent or constant Supervision/Assistance  ?Patient can return home with the following ? Two people to help with walking and/or transfers;Two people to help with bathing/dressing/bathroom;Assistance with  cooking/housework;Direct supervision/assist for medications management;Direct supervision/assist for financial management;Assist for transportation;Help with stairs or ramp for entrance ?  ?Equipment Recommendations ? BSC/3in1;Wheelchair (measurements OT);Wheelchair cushion (measurements OT);Tub/shower bench  ?  ?   ?Precautions / Restrictions Precautions ?Precautions: Fall ?Precaution Comments: R inattention, R pushing ?Restrictions ?Weight Bearing Restrictions: No  ? ? ?  ? ?Mobility Bed Mobility ?Overal bed mobility: Needs Assistance ?Bed Mobility: Supine to Sit, Sit to Supine ?  ?  ?Supine to sit: Max assist, HOB elevated ?Sit to supine: Max assist ?  ?General bed mobility comments: Min cues to locate RUE with pt unable to locate.  Therapist providing education on body positioning to facilitate increased engagement in bed mobility.  Pt required max assist with sidelying <> sitting.  Pt able to weight shift back to L elbow with mod cues when returning to sidelying. Physical assistance for advancing BLE in and out of bed and facilitation at trunk ?  ? ?  ?  ?  ?  ?  ?  ?  ?  ?  ?  ?  ?  ?Balance Overall balance assessment: Needs assistance ?Sitting-balance support: Feet supported, Single extremity supported ?Sitting balance-Leahy Scale: Poor ?Sitting balance - Comments: Pt. able to maintain sitting balance with min-max assist due to R lean and intermittent pushing.  Pt utilizing LUE to complete grooming tasks, but even without use of LUE pt demonstrating decreased sitting balance. ?Postural control: Posterior lean, Right lateral lean ?  ?  ?  ?  ?  ?  ?  ?  ?  ?  ?  ?  ?  ?  ?   ? ?ADL either performed or assessed with clinical judgement  ? ?  ADL Overall ADL's : Needs assistance/impaired ?  ?  ?Grooming: Wash/dry hands;Wash/dry face;Maximal assistance ?Grooming Details (indicate cue type and reason): max assist for sitting balance due to R lean ?pushing.  Pt able to wash face and hands and take sips of water to  rinse mouth with setup of items at midline. ?  ?  ?  ?  ?  ?  ?  ?  ?  ?  ?  ?  ?  ?  ?Functional mobility during ADLs: Maximal assistance ?General ADL Comments: Max assist for sitting balance at EOB due to R lean and pushing.  Pt with minimal initiation with transfer from sidelying to sitting. ?  ? ?Extremity/Trunk Assessment Upper Extremity Assessment ?Upper Extremity Assessment: RUE deficits/detail ?RUE Deficits / Details: PROM WFL; unableto elicit active movement this date; flaccid ?RUE Sensation: decreased light touch;decreased proprioception ?RUE Coordination: decreased gross motor;decreased fine motor (non-functional) ?  ?  ?  ?  ?  ? ?Vision   ?Additional Comments: ? R field cut - pt able to locate items at midline, but with no visual scanning or head turn to R to obtain items from therapist until presented in midline ?  ?   ?   ? ?Cognition Arousal/Alertness: Awake/alert ?Behavior During Therapy: [facility] for tasks assessed/performed ?Overall Cognitive Status: Impaired/Different from baseline ?Area of Impairment: Attention, Following commands, Problem solving, Awareness, Safety/judgement ?  ?  ?  ?  ?  ?  ?  ?  ?  ?Current Attention Level: Sustained ?  ?Following Commands: Follows one step commands consistently, Follows multi-step commands inconsistently ?Safety/Judgement: Decreased awareness of safety, Decreased awareness of deficits ?Awareness: Emergent ?Problem Solving: Slow processing, Decreased initiation, Requires verbal cues, Requires tactile cues, Difficulty sequencing ?General Comments: Pt expressing awareness of decreased midline sitting balance, however unable to correct and no initiation to correct. ?  ?  ?   ?   ?   ?   ? ? ?Pertinent Vitals/ Pain       Pain Assessment ?Pain Assessment: No/denies pain ? ?   ?   ? ?Frequency ? Min 2X/week  ? ? ? ? ?  ?Progress Toward Goals ? ?OT Goals(current goals can now be found in the care plan section) ? Progress towards OT goals: Progressing toward  goals ? ?Acute Rehab OT Goals ?Patient Stated Goal: to get better ?OT Goal Formulation: With patient ?Time For Goal Achievement: 06/06/21 ?Potential to Achieve Goals: Good  ?Plan     ? ?AM-PAC OT "6 Clicks" Daily Activity     ?Outcome Measure ? ? Help from another person eating meals?: A Little ?Help from another person taking care of personal grooming?: A Lot ?Help from another person toileting, which includes using toliet, bedpan, or urinal?: Total ?Help from another person bathing (including washing, rinsing, drying)?: A Lot ?Help from another person to put on and taking off regular upper body clothing?: A Lot ?Help from another person to put on and taking off regular lower body clothing?: Total ?6 Click Score: 11 ? ?  ?End of Session   ? ?OT Visit Diagnosis: Unsteadiness on feet (R26.81);Other abnormalities of gait and mobility (R26.89);Muscle weakness (generalized) (M62.81);Other symptoms and signs involving cognitive function;Other symptoms and signs involving the nervous system (R29.898);Hemiplegia and hemiparesis ?Hemiplegia - Right/Left: Right ?Hemiplegia - dominant/non-dominant: Non-Dominant ?Hemiplegia - caused by: Cerebral infarction ?  ?Activity Tolerance Patient tolerated treatment well ?  ?Patient Left in bed;with call bell/phone within reach;with bed alarm set ?  ?Nurse Communication Mobility  status;Need for lift equipment Antony Salmon) ?  ? ?   ? ?Time: 8588-5027 ?OT Time Calculation (min): 16 min ? ?Charges: OT General Charges ?$OT Visit: 1 Visit ?OT Treatments ?$Self Care/Home Management : 8-22 mins ? ?Rosalio Loud, MS, OTR/L ?[phone] ? ?Rosalio Loud ?05/26/2021, 10:18 AM ?

## 2021-05-26 NOTE — TOC Transition Note (Signed)
Transition of Care (TOC) - CM/SW Discharge Note ? ? ?Patient Details  ?Name: Anthony Ferguson ?MRN: [id number] ?Date of Birth: [birth year] ? ?Transition of Care (TOC) CM/SW Contact:  ?Kermit Balo, RN ?Phone Number: ?05/26/2021, 10:55 AM ? ? ?Clinical Narrative:    ?Patient is discharging to [facility]) today. Bedside RN is updated and the bedside RN has been updated. Discharge packet is at the desk.  ? ?Number for report: [id number] ? ?Final next level of care: Skilled Nursing Facility ?Barriers to Discharge: No Barriers Identified ? ? ?Patient Goals and CMS Choice ?  ?CMS Medicare.gov Compare Post Acute Care list provided to:: Patient ?Choice offered to / list presented to : Patient ? ?Discharge Placement ?PASRR number recieved: 05/25/21 ?           ?Patient chooses bed at:  ([facility])) ?Patient to be transferred to facility by: [facility]AR ?Name of family member notified: Natashia--daughter ?Patient and family notified of of transfer: 05/26/21 ? ?Discharge Plan and Services ?In-house Referral: Clinical Social Work ?Discharge Planning Services: CM Consult ?Post Acute Care Choice: Skilled Nursing Facility          ?  ?  ?  ?  ?  ?  ?  ?  ?  ?  ? ?Social Determinants of Health (SDOH) Interventions ?  ? ? ?Readmission Risk Interventions ?   ? View : No data to display.  ?  ?  ?  ? ? ? ? ? ?

## 2021-06-01 ENCOUNTER — Telehealth: Payer: Self-pay

## 2021-06-01 NOTE — Telephone Encounter (Signed)
Received call today from [place], Rn with [facility]. States that patient is currently a resident there. Is not sure if patient has been taking medication before arriving to facility.  ?Rn would like to know if patient needs any labs drawn before restarting Biktarvy. Spoke with Dr. Orvan Falconer who is okay with patient starting medication today. Would need to have VL and CD4 drawn. Also follow up within a month. ?RN was able to take verbal order for labs. Will fax results to office. Is scheduled to follow up later this month. ?Juanita Laster, RMA ? ?

## 2021-06-16 ENCOUNTER — Ambulatory Visit: Payer: Medicare HMO | Admitting: Internal Medicine

## 2021-07-12 ENCOUNTER — Other Ambulatory Visit: Payer: Self-pay

## 2021-07-12 ENCOUNTER — Ambulatory Visit (INDEPENDENT_AMBULATORY_CARE_PROVIDER_SITE_OTHER): Payer: Medicare HMO | Admitting: Internal Medicine

## 2021-07-12 DIAGNOSIS — F17200 Nicotine dependence, unspecified, uncomplicated: Secondary | ICD-10-CM

## 2021-07-12 DIAGNOSIS — B20 Human immunodeficiency virus [HIV] disease: Secondary | ICD-10-CM

## 2021-07-12 DIAGNOSIS — F1410 Cocaine abuse, uncomplicated: Secondary | ICD-10-CM

## 2021-07-12 NOTE — Assessment & Plan Note (Signed)
His HIV infection remains under excellent control.  I recommend that he continue daily Biktarvy and follow-up here in 6 months.

## 2021-07-12 NOTE — Progress Notes (Signed)
Patient Active Problem List   Diagnosis Date Noted   Human immunodeficiency virus (HIV) disease ([place]) 03/03/2006    Priority: High   Chronic hepatitis C virus infection ([place]) 03/03/2006    Priority: Medium    Acute ischemic left MCA stroke (Anthony Ferguson) 05/20/2021   Middle cerebral artery embolism, 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   Protein-calorie malnutrition, severe 05/20/2021   Erectile dysfunction 11/26/2018   Cocaine abuse ([place]) 04/14/2015   Normocytic anemia 04/14/2015   Thrombocytopenia ([place]) 04/14/2015   Type 2 diabetes mellitus ([place]) 03/03/2006   TOBACCO USER 03/03/2006   Coronary atherosclerosis 03/03/2006    Patient's Medications  New Prescriptions   No medications on file  Previous Medications   AMLODIPINE (NORVASC) 10 MG TABLET    Take 1 tablet (10 mg total) by mouth daily.   APIXABAN (ELIQUIS) 5 MG TABS TABLET    Take 1 tablet (5 mg total) by mouth 2 (two) times daily.   BACLOFEN (LIORESAL) 10 MG TABLET    Take by mouth.   BICTEGRAVIR-EMTRICITABINE-TENOFOVIR AF (BIKTARVY) 50-200-25 MG TABS TABLET    Take 1 tablet by mouth daily.   CHLORHEXIDINE (PERIDEX) 0.12 % SOLUTION    15 mLs by Mouth Rinse route 2 (two) times daily.   DILTIAZEM (CARDIZEM) 30 MG TABLET    Take 1 tablet (30 mg total) by mouth every 8 (eight) hours.   FEEDING SUPPLEMENT, GLUCERNA SHAKE, (GLUCERNA SHAKE) LIQD    Take 237 mLs by mouth 3 (three) times daily between meals.   FOLIC ACID (FOLVITE) 1 MG TABLET    Take 1 tablet (1 mg total) by mouth daily.   GLUCAGON, RDNA, (GLUCAGON EMERGENCY) 1 MG KIT    Inject into the muscle.   GLUCOSE BLOOD TEST STRIP    Use as instructed   HYDRALAZINE (APRESOLINE) 50 MG TABLET    Take 1 tablet (50 mg total) by mouth every 8 (eight) hours.   INSULIN LISPRO (HUMALOG) 100 UNIT/ML KWIKPEN    Inject into the skin.   LISINOPRIL (ZESTRIL) 10 MG TABLET    Take 1 tablet (10 mg total) by mouth daily.   MULTIPLE VITAMIN (MULTIVITAMIN WITH MINERALS) TABS TABLET    Take 1 tablet by mouth  daily.   OMEGA-3 ACID ETHYL ESTERS (LOVAZA) 1 G CAPSULE    Take 1 capsule by mouth 2 (two) times daily.   OXYCODONE (OXY IR/ROXICODONE) 5 MG IMMEDIATE RELEASE TABLET    Take 5 mg by mouth 2 (two) times daily.   PANTOPRAZOLE SODIUM (PROTONIX) 40 MG    Take 40 mg by mouth at bedtime.   ROSUVASTATIN (CRESTOR) 5 MG TABLET    Take by mouth.   SENNA-DOCUSATE (SENOKOT-S) 8.6-50 MG TABLET    Place 1 tablet into feeding tube at bedtime as needed for moderate constipation or mild constipation.   THIAMINE 100 MG TABLET    Take 1 tablet (100 mg total) by mouth daily.  Modified Medications   No medications on file  Discontinued Medications   INSULIN ASPART (NOVOLOG) 100 UNIT/ML INJECTION    Inject 3 Units into the skin 4 (four) times daily -  before meals and at bedtime.   PRAVASTATIN (PRAVACHOL) 20 MG TABLET    Take 1 tablet (20 mg total) by mouth daily.    Subjective: Anthony Ferguson is in for his first visit since October 2021.  He tells me that he was able to continue to get his Biktarvy and had not been missing doses.  He was  hospitalized in March with a presumed embolic left MCA CVA caused by atrial fibrillation.  His urine drug screen was positive for cocaine.  He was still smoking cigarettes but tells me that he has not had a cigarette since he went into the hospital.  He was discharged to [facility] and rehab center in [place].  He says that he has been working with physical therapy and has regained some strength in his right leg.  He still remains quite weak in his right arm.  Recent lab work there showed that his CD4 count was normal at 431 and his HIV viral load was undetectable at less than 20.  Review of Systems: Review of Systems  Constitutional:  Positive for malaise/fatigue. Negative for fever.  Neurological:  Positive for focal weakness. Negative for speech change.  Psychiatric/Behavioral:  Negative for depression.    Past Medical History:  Diagnosis Date   HIV (human  immunodeficiency virus infection) ([place])    Hypercholesteremia    Hypertension    Type 2 diabetes mellitus ([place])     Social History   Tobacco Use   Smoking status: Every Day    Packs/day: 0.30    Types: Cigarettes   Smokeless tobacco: Never   Tobacco comments:    Trying to slow down  Vaping Use   Vaping Use: Never used  Substance Use Topics   Alcohol use: Yes    Alcohol/week: 5.0 standard drinks    Types: 5 Cans of beer per week    Comment: occas   Drug use: Not Currently    Types: Cocaine    Comment: last use 07/22/16    Family History  Problem Relation Age of Onset   Diabetes Mother    Heart failure Mother     Allergies  Allergen Reactions   Hctz [Hydrochlorothiazide] Other (See Comments)    "ran my sugar up"   Pork-Derived Products Palpitations and Hypertension    Health Maintenance  Topic Date Due   COVID-19 Vaccine (1) Never done   FOOT EXAM  Never done   OPHTHALMOLOGY EXAM  Never done   Zoster Vaccines- Shingrix (1 of 2) Never done   COLONOSCOPY (Pts 45-73yr Insurance coverage will need to be confirmed)  Never done   Pneumonia Vaccine 71 Years old (3 - PPSV23 if available, else PCV20) 03/20/2019   INFLUENZA VACCINE  09/20/2021   HEMOGLOBIN A1C  11/19/2021   TETANUS/TDAP  09/14/2029   Hepatitis C Screening  Completed   HPV VACCINES  Aged Out    Objective:  Vitals:   07/12/21 1446  BP: 106/61  Pulse: 80  Resp: 16  SpO2: 98%  Weight: 160 lb 9.6 oz (72.8 kg)  Height: _0  (1.905 m)   Body mass index is 20.07 kg/m.  Physical Exam Constitutional:      Comments: He is very pleasant and interactive.  He jokes "long time no see".  He is seated in a wheelchair.  Cardiovascular:     Rate and Rhythm: Normal rate.  Pulmonary:     Effort: Pulmonary effort is normal.  Neurological:     Motor: Weakness present.  Psychiatric:        Mood and Affect: Mood normal.    Lab Results Lab Results  Component Value Date   WBC 5.3 05/23/2021   HGB 13.7  05/23/2021   HCT 39.3 05/23/2021   MCV 89.5 05/23/2021   PLT 125 (L) 05/23/2021    Lab Results  Component Value Date   CREATININE  0.80 05/23/2021   BUN 13 05/23/2021   NA 133 (L) 05/23/2021   K 3.9 05/23/2021   CL 103 05/23/2021   CO2 21 (L) 05/23/2021    Lab Results  Component Value Date   ALT 11 12/17/2019   AST 18 12/17/2019   GGT 251 (H) 11/16/2016   ALKPHOS 103 10/12/2016   BILITOT 0.6 12/17/2019    Lab Results  Component Value Date   CHOL 141 05/20/2021   HDL 69 05/20/2021   LDLCALC 65 05/20/2021   TRIG 37 05/20/2021   CHOLHDL 2.0 05/20/2021   Lab Results  Component Value Date   LABRPR NON-REACTIVE 12/17/2019   HIV 1 RNA Quant  Date Value  12/17/2019 <20 Copies/mL  11/26/2018 <20 DETECTED copies/mL (A)  03/19/2018 <20 NOT DETECTED copies/mL   CD4 T Cell Abs (/uL)  Date Value  12/17/2019 320 (L)  11/26/2018 375 (L)  03/19/2018 480     Problem List Items Addressed This Visit       High   Human immunodeficiency virus (HIV) disease ([place])    His HIV infection remains under excellent control.  I recommend that he continue daily Biktarvy and follow-up here in 6 months.         Unprioritized   TOBACCO USER    I congratulated him on quitting cigarettes and reminded him how important this is for secondary prevention of another CVA.       Cocaine abuse ([place])    He has fairly good insight into the association of cocaine use and his recent stroke.          Michel Bickers, MD [facility] [phone] pager   [phone] cell 07/12/2021, 3:06 PM

## 2021-07-12 NOTE — Assessment & Plan Note (Signed)
He has fairly good insight into the association of cocaine use and his recent stroke.

## 2021-07-12 NOTE — Assessment & Plan Note (Signed)
I congratulated him on quitting cigarettes and reminded him how important this is for secondary prevention of another CVA.

## 2021-08-04 ENCOUNTER — Encounter: Payer: Self-pay | Admitting: Neurology

## 2021-08-04 ENCOUNTER — Ambulatory Visit (INDEPENDENT_AMBULATORY_CARE_PROVIDER_SITE_OTHER): Payer: Medicare HMO | Admitting: Neurology

## 2021-08-04 VITALS — BP 127/81 | HR 84

## 2021-08-04 DIAGNOSIS — G8191 Hemiplegia, unspecified affecting right dominant side: Secondary | ICD-10-CM | POA: Diagnosis not present

## 2021-08-04 DIAGNOSIS — I4811 Longstanding persistent atrial fibrillation: Secondary | ICD-10-CM

## 2021-08-04 DIAGNOSIS — I6602 Occlusion and stenosis of left middle cerebral artery: Secondary | ICD-10-CM

## 2021-08-04 NOTE — Progress Notes (Signed)
[facility] [address] [place]. [place] [address] [phone]       OFFICE FOLLOW-UP NOTE  [name] Date of Birth:  [birth year] Medical Record Number:  [id number]   HPI: Mr. [name] is a 71 year old African-American male seen today for initial office follow-up visit following hospital admission for stroke.  He is accompanied by his nursing home attendant.  History is obtained from the patient and review of electronic medical records and I have personally reviewed pertinent available imaging films in PACS.  He has past medical history of hypertension, hyperlipidemia, diabetes and HIV.  He presented on 05/20/2021 to [facility] after he was drinking and slipped out of his chair and noticed right facial droop he was noted to be repeating himself and a code stroke was called he was evaluated by telemetry neurology but due to the last known normal being on.  He was not given IV TNK.  CT scan of the head showed hyperdense left MCA and CT angiogram showed left M1 occlusion.  CT perfusion showed no core and large 150 cc penumbra.  Patient's family could not be immediately reached and neuro hospitalist and neuro interventionist due to emergent consent for thrombectomy.  NIH stroke scale was 24.  Patient underwent successful procedure with TICI 3 revascularization.  Patient had a history of atrial fibrillation but had not been compliant with his Eliquis.  His urine drug screen was positive for cocaine.  He was kept in the ICU and blood pressure was tightly controlled.  He progressed gradually and showed some improvement but did have a aphasia and right hemiparesis.  MRI showed acute infarcts involving left corona radiator and punctate left frontal and temporal lobe and basal ganglia infarct with small area of hemorrhage.  2D echo showed ejection fraction of 55 to 60%.  LDL cholesterol 66 mg percent hemoglobin A1c was 7.9.  He was seen by physical occupational therapy and speech  therapy.  He was considered a candidate for skilled nursing facility for ongoing rehab needs.  He was put back on Eliquis and counseled to quit smoking cigarettes.  Patient is currently at skilled nursing facility and returns today for initial office visit.  His speech has improved and is able to speak quite well though slightly hesitant.  His swallowing is also improved.  He still has significant right hemiplegia particularly in the right arm with he can barely move his shoulder movement is not there at the wrist or hand.  He is able to lift his right leg off the bed but has right foot drop.  He is working with physical and Occupational Therapy daily and can assist with transfers and can stand with support but has not yet been able to walk.  He is tolerating Eliquis well without bruising or bleeding.  ROS:   14 system review of systems is positive for speech difficulties, weakness, pain, walking difficulties all other systems negative  PMH:  Past Medical History:  Diagnosis Date   HIV (human immunodeficiency virus infection) ([place])    Hypercholesteremia    Hypertension    Type 2 diabetes mellitus ([place])     Social History:  Social History   Socioeconomic History   Marital status: Legally Separated    Spouse name: Not on file   Number of children: Not on file   Years of education: Not on file   Highest education level: Not on file  Occupational History   Not on file  Tobacco Use  Smoking status: Every Day    Packs/day: 0.30    Types: Cigarettes   Smokeless tobacco: Never   Tobacco comments:    Trying to slow down  Vaping Use   Vaping Use: Never used  Substance and Sexual Activity   Alcohol use: Yes    Alcohol/week: 5.0 standard drinks of alcohol    Types: 5 Cans of beer per week    Comment: occas   Drug use: Not Currently    Types: Cocaine    Comment: last use 07/22/16   Sexual activity: Not Currently    Partners: Female    Comment: condoms declined  Other Topics Concern    Not on file  Social History Narrative   Not on file   Social Determinants of Health   Financial Resource Strain: Not on file  Food Insecurity: Not on file  Transportation Needs: Not on file  Physical Activity: Not on file  Stress: Not on file  Social Connections: Not on file  Intimate Partner Violence: Not on file    Medications:   Current Outpatient Medications on File Prior to Visit  Medication Sig Dispense Refill   amLODipine (NORVASC) 10 MG tablet Take 1 tablet (10 mg total) by mouth daily.     apixaban (ELIQUIS) 5 MG TABS tablet Take 1 tablet (5 mg total) by mouth 2 (two) times daily. 60 tablet 0   baclofen (LIORESAL) 10 MG tablet Take by mouth.     bictegravir-emtricitabine-tenofovir AF (BIKTARVY) 50-200-25 MG TABS tablet Take 1 tablet by mouth daily. 30 tablet 1   chlorhexidine (PERIDEX) 0.12 % solution 15 mLs by Mouth Rinse route 2 (two) times daily. 120 mL 0   diltiazem (CARDIZEM) 30 MG tablet Take 1 tablet (30 mg total) by mouth every 8 (eight) hours.     feeding supplement, GLUCERNA SHAKE, (GLUCERNA SHAKE) LIQD Take 237 mLs by mouth 3 (three) times daily between meals.  0   folic acid (FOLVITE) 1 MG tablet Take 1 tablet (1 mg total) by mouth daily.     Glucagon, rDNA, (GLUCAGON EMERGENCY) 1 MG KIT Inject into the muscle.     glucose blood test strip Use as instructed 100 each 12   hydrALAZINE (APRESOLINE) 50 MG tablet Take 1 tablet (50 mg total) by mouth every 8 (eight) hours.     insulin lispro (HUMALOG) 100 UNIT/ML KwikPen Inject into the skin.     lisinopril (ZESTRIL) 10 MG tablet Take 1 tablet (10 mg total) by mouth daily.     Multiple Vitamin (MULTIVITAMIN WITH MINERALS) TABS tablet Take 1 tablet by mouth daily.     omega-3 acid ethyl esters (LOVAZA) 1 g capsule Take 1 capsule by mouth 2 (two) times daily.     oxyCODONE (OXY IR/ROXICODONE) 5 MG immediate release tablet Take 5 mg by mouth 2 (two) times daily.     pantoprazole sodium (PROTONIX) 40 mg Take 40 mg by  mouth at bedtime.     rosuvastatin (CRESTOR) 5 MG tablet Take by mouth.     senna-docusate (SENOKOT-S) 8.6-50 MG tablet Place 1 tablet into feeding tube at bedtime as needed for moderate constipation or mild constipation.     thiamine 100 MG tablet Take 1 tablet (100 mg total) by mouth daily.     No current facility-administered medications on file prior to visit.    Allergies:   Allergies  Allergen Reactions   Hctz [Hydrochlorothiazide] Other (See Comments)    "ran my sugar up"   Pork-Derived Products Palpitations and  Hypertension    Physical Exam General: well developed, well nourished, seated, in no evident distress Head: head normocephalic and atraumatic.  Neck: supple with no carotid or supraclavicular bruits Cardiovascular: regular rate and rhythm, no murmurs Musculoskeletal: no deformity Skin:  no rash/petichiae Vascular:  Normal pulses all extremities Vitals:   08/04/21 1027  BP: 127/81  Pulse: 84   Neurologic Exam Mental Status: Awake and fully alert. Oriented to place and time. Recent and remote memory intact. Attention span, concentration and fund of knowledge appropriate. Mood and affect appropriate.  Speech is slightly nonfluent but able to speak sentences, name and repeat well.  Good comprehension. Cranial Nerves: Fundoscopic exam reveals sharp disc margins. Pupils equal, briskly reactive to light. Extraocular movements full without nystagmus. Visual fields full to confrontation. Hearing intact. Facial sensation intact.  Mild right lower facial asymmetry when he smiles., tongue, palate moves normally and symmetrically.  Motor: Spastic right hemiplegia with 0/5 right upper extremity strength and 3/5 right lower extremity strength proximally at the hip and knees and ankle foot drop.  Tone is increased on the right and passive movements are painful. Sensory.:  Diminished touch ,pinprick .position and vibratory sensation on the right side. Coordination: Normal on the left  hand. Gait and Station: Not tested as patient is in wheelchair and is unable to walk  reflexes: 2+ and asymmetric and brisker on the right. Toes downgoing.   NIHSS  9 Modified Rankin  4   ASSESSMENT: 71 year old African-American male with left MCA infarct and March 2023 secondary to left M1 occlusion from atrial fibrillation not on anticoagulation due to medication noncompliance treated with successful mechanical thrombectomy with excellent revascularization.  Patient has shown significant improvement in his speech but continues to have right hemiplegia.  Vascular risk factors of atrial fibrillation, cocaine abuse, smoking, diabetes, hyperlipidemia and hypertension.     PLAN: I had a long d/w patient nursing home attendant about his recent stroke, atrial fibrillation, mechanical thrombectomy, residual hemiplegia, risk for recurrent stroke/TIAs, personally independently reviewed imaging studies and stroke evaluation results and answered questions.Continue Eliquis (apixaban) daily  for secondary stroke prevention and maintain strict control of hypertension with blood pressure goal below 130/90, diabetes with hemoglobin A1c goal below 6.5% and lipids with LDL cholesterol goal below 70 mg/dL. I also advised the patient to eat a healthy diet with plenty of whole grains, cereals, fruits and vegetables, exercise regularly and maintain ideal body weight .I recommend he continue ongoing physical and occupational therapy and hopefully progress enough to be able to walk so that he can go home with his daughter.  He was counseled not to smoke cigarettes or use cocaine.  Followup in the future with me only as needed and no schedule appointment was made.Greater than 50% of time during this 35 minute visit was spent on counseling,explanation of diagnosis, planning of further management, discussion with patient and family and coordination of care Antony Contras, MD Note: This document was prepared with digital  dictation and possible smart phrase technology. Any transcriptional errors that result from this process are unintentional

## 2021-08-04 NOTE — Patient Instructions (Signed)
I had a long d/w patient nursing home attendant about his recent stroke, atrial fibrillation, mechanical thrombectomy, residual hemiplegia, risk for recurrent stroke/TIAs, personally independently reviewed imaging studies and stroke evaluation results and answered questions.Continue Eliquis (apixaban) daily  for secondary stroke prevention and maintain strict control of hypertension with blood pressure goal below 130/90, diabetes with hemoglobin A1c goal below 6.5% and lipids with LDL cholesterol goal below 70 mg/dL. I also advised the patient to eat a healthy diet with plenty of whole grains, cereals, fruits and vegetables, exercise regularly and maintain ideal body weight .I recommend he continue ongoing physical and occupational therapy and hopefully progress enough to be able to walk so that he can go home with his daughter.  He was counseled not to smoke cigarettes or use cocaine.  Followup in the future with me only as needed and no schedule appointment was made. Stroke Prevention Some medical conditions and behaviors can lead to a higher chance of having a stroke. You can help prevent a stroke by eating healthy, exercising, not smoking, and managing any medical conditions you have. Stroke is a leading cause of functional impairment. Primary prevention is particularly important because a majority of strokes are first-time events. Stroke changes the lives of not only those who experience a stroke but also their family and other caregivers. How can this condition affect me? A stroke is a medical emergency and should be treated right away. A stroke can lead to brain damage and can sometimes be life-threatening. If a person gets medical treatment right away, there is a better chance of surviving and recovering from a stroke. What can increase my risk? The following medical conditions may increase your risk of a stroke: Cardiovascular disease. High blood pressure (hypertension). Diabetes. High  cholesterol. Sickle cell disease. Blood clotting disorders (hypercoagulable state). Obesity. Sleep disorders (obstructive sleep apnea). Other risk factors include: Being older than age 41. Having a history of blood clots, stroke, or mini-stroke (transient ischemic attack, TIA). Genetic factors, such as race, ethnicity, or a family history of stroke. Smoking cigarettes or using other tobacco products. Taking birth control pills, especially if you also use tobacco. Heavy use of alcohol or drugs, especially cocaine and methamphetamine. Physical inactivity. What actions can I take to prevent this? Manage your health conditions High cholesterol levels. Eating a healthy diet is important for preventing high cholesterol. If cholesterol cannot be managed through diet alone, you may need to take medicines. Take any prescribed medicines to control your cholesterol as told by your health care provider. Hypertension. To reduce your risk of stroke, try to keep your blood pressure below 130/80. Eating a healthy diet and exercising regularly are important for controlling blood pressure. If these steps are not enough to manage your blood pressure, you may need to take medicines. Take any prescribed medicines to control hypertension as told by your health care provider. Ask your health care provider if you should monitor your blood pressure at home. Have your blood pressure checked every year, even if your blood pressure is normal. Blood pressure increases with age and some medical conditions. Diabetes. Eating a healthy diet and exercising regularly are important parts of managing your blood sugar (glucose). If your blood sugar cannot be managed through diet and exercise, you may need to take medicines. Take any prescribed medicines to control your diabetes as told by your health care provider. Get evaluated for obstructive sleep apnea. Talk to your health care provider about getting a sleep evaluation if  you snore  a lot or have excessive sleepiness. Make sure that any other medical conditions you have, such as atrial fibrillation or atherosclerosis, are managed. Nutrition Follow instructions from your health care provider about what to eat or drink to help manage your health condition. These instructions may include: Reducing your daily calorie intake. Limiting how much salt (sodium) you use to 1,500 milligrams (mg) each day. Using only healthy fats for cooking, such as olive oil, canola oil, or sunflower oil. Eating healthy foods. You can do this by: Choosing foods that are high in fiber, such as whole grains, and fresh fruits and vegetables. Eating at least 5 servings of fruits and vegetables a day. Try to fill one-half of your plate with fruits and vegetables at each meal. Choosing lean protein foods, such as lean cuts of meat, poultry without skin, fish, tofu, beans, and nuts. Eating low-fat dairy products. Avoiding foods that are high in sodium. This can help lower blood pressure. Avoiding foods that have saturated fat, trans fat, and cholesterol. This can help prevent high cholesterol. Avoiding processed and prepared foods. Counting your daily carbohydrate intake.  Lifestyle If you drink alcohol: Limit how much you have to: 0-1 drink a day for women who are not pregnant. 0-2 drinks a day for men. Know how much alcohol is in your drink. In the [place] one drink equals one 12 oz bottle of beer ( ), one 5 oz glass of wine ( ), or one 1 oz glass of hard liquor (26mL). Do not use any products that contain nicotine or tobacco. These products include cigarettes, chewing tobacco, and vaping devices, such as e-cigarettes. If you need help quitting, ask your health care provider. Avoid secondhand smoke. Do not use drugs. Activity  Try to stay at a healthy weight. Get at least 30 minutes of exercise on most days, such as: Fast walking. Biking. Swimming. Medicines Take  over-the-counter and prescription medicines only as told by your health care provider. Aspirin or blood thinners (antiplatelets or anticoagulants) may be recommended to reduce your risk of forming blood clots that can lead to stroke. Avoid taking birth control pills. Talk to your health care provider about the risks of taking birth control pills if: You are over 65 years old. You smoke. You get very bad headaches. You have had a blood clot. Where to find more information American Stroke Association: www.strokeassociation.org Get help right away if: You or a loved one has any symptoms of a stroke. "BE FAST" is an easy way to remember the main warning signs of a stroke: B - Balance. Signs are dizziness, sudden trouble walking, or loss of balance. E - Eyes. Signs are trouble seeing or a sudden change in vision. F - Face. Signs are sudden weakness or numbness of the face, or the face or eyelid drooping on one side. A - Arms. Signs are weakness or numbness in an arm. This happens suddenly and usually on one side of the body. S - Speech. Signs are sudden trouble speaking, slurred speech, or trouble understanding what people say. T - Time. Time to call emergency services. Write down what time symptoms started. You or a loved one has other signs of a stroke, such as: A sudden, severe headache with no known cause. Nausea or vomiting. Seizure. These symptoms may represent a serious problem that is an emergency. Do not wait to see if the symptoms will go away. Get medical help right away. Call your local emergency services (911 in the [place] Do not drive yourself  to the hospital. Summary You can help to prevent a stroke by eating healthy, exercising, not smoking, limiting alcohol intake, and managing any medical conditions you may have. Do not use any products that contain nicotine or tobacco. These include cigarettes, chewing tobacco, and vaping devices, such as e-cigarettes. If you need help quitting,  ask your health care provider. Remember "BE FAST" for warning signs of a stroke. Get help right away if you or a loved one has any of these signs. This information is not intended to replace advice given to you by your health care provider. Make sure you discuss any questions you have with your health care provider. Document Revised: 09/08/2019 Document Reviewed: 09/08/2019 Elsevier Patient Education  2023 [employer].

## 2021-08-31 ENCOUNTER — Other Ambulatory Visit: Payer: Self-pay

## 2021-08-31 NOTE — Patient Outreach (Signed)
[facility]) Care Management  08/31/2021  Anthony Ferguson 1950/12/02 [id number]   First telephone outreach attempt to obtain mRS. No answer. Unable to leave message for returned call.  [name] [facility] Management Assistant [phone]

## 2021-09-06 ENCOUNTER — Other Ambulatory Visit: Payer: Self-pay

## 2021-09-06 NOTE — Patient Outreach (Signed)
[facility]) Care Management  09/06/2021  Anthony Ferguson 08-Apr-1950 [id number]   Second telephone outreach attempt to obtain mRS. No answer. Unable to leave message for returned call.  [name] [facility] Management Assistant [phone]

## 2021-09-08 ENCOUNTER — Other Ambulatory Visit: Payer: Self-pay

## 2021-09-08 NOTE — Patient Outreach (Signed)
[facility]) Care Management  09/08/2021  Anthony Ferguson 05-03-1950 [id number]   3 outreach attempts were completed to obtain mRs. mRs could not be obtained because patient never returned my calls. mRs=7    [name] Care Management Assistant [phone]

## 2021-09-27 ENCOUNTER — Other Ambulatory Visit (HOSPITAL_COMMUNITY): Payer: Self-pay

## 2021-10-10 ENCOUNTER — Other Ambulatory Visit: Payer: Self-pay

## 2021-10-10 ENCOUNTER — Emergency Department (HOSPITAL_COMMUNITY): Payer: Medicare HMO

## 2021-10-10 ENCOUNTER — Encounter (HOSPITAL_COMMUNITY): Payer: Self-pay | Admitting: Emergency Medicine

## 2021-10-10 ENCOUNTER — Emergency Department (HOSPITAL_COMMUNITY)
Admission: EM | Admit: 2021-10-10 | Discharge: 2021-10-11 | Disposition: A | Discharge status: Skilled Nursing Facility | Payer: Medicare HMO | Attending: Emergency Medicine | Admitting: Emergency Medicine

## 2021-10-10 DIAGNOSIS — Z794 Long term (current) use of insulin: Secondary | ICD-10-CM | POA: Diagnosis not present

## 2021-10-10 DIAGNOSIS — M79641 Pain in right hand: Secondary | ICD-10-CM | POA: Insufficient documentation

## 2021-10-10 DIAGNOSIS — Z79899 Other long term (current) drug therapy: Secondary | ICD-10-CM | POA: Diagnosis not present

## 2021-10-10 DIAGNOSIS — M25551 Pain in right hip: Secondary | ICD-10-CM | POA: Insufficient documentation

## 2021-10-10 DIAGNOSIS — M25561 Pain in right knee: Secondary | ICD-10-CM | POA: Diagnosis not present

## 2021-10-10 DIAGNOSIS — M79631 Pain in right forearm: Secondary | ICD-10-CM | POA: Insufficient documentation

## 2021-10-10 DIAGNOSIS — I10 Essential (primary) hypertension: Secondary | ICD-10-CM | POA: Diagnosis not present

## 2021-10-10 DIAGNOSIS — E119 Type 2 diabetes mellitus without complications: Secondary | ICD-10-CM | POA: Diagnosis not present

## 2021-10-10 DIAGNOSIS — I2510 Atherosclerotic heart disease of native coronary artery without angina pectoris: Secondary | ICD-10-CM | POA: Diagnosis not present

## 2021-10-10 DIAGNOSIS — W19XXXA Unspecified fall, initial encounter: Secondary | ICD-10-CM

## 2021-10-10 LAB — COMPREHENSIVE METABOLIC PANEL
ALT: 9 U/L (ref 0–44)
AST: 14 U/L — ABNORMAL LOW (ref 15–41)
Albumin: 3.9 g/dL (ref 3.5–5.0)
Alkaline Phosphatase: 74 U/L (ref 38–126)
Anion gap: 8 (ref 5–15)
BUN: 16 mg/dL (ref 8–23)
CO2: 26 mmol/L (ref 22–32)
Calcium: 9.7 mg/dL (ref 8.9–10.3)
Chloride: 105 mmol/L (ref 98–111)
Creatinine, Ser: 0.69 mg/dL (ref 0.61–1.24)
GFR, Estimated: >60 mL/min (ref >60)
Glucose, Bld: 214 mg/dL — ABNORMAL HIGH (ref 70–99)
Potassium: 3.8 mmol/L (ref 3.5–5.1)
Sodium: 139 mmol/L (ref 135–145)
Total Bilirubin: 0.6 mg/dL (ref 0.3–1.2)
Total Protein: 8.1 g/dL (ref 6.5–8.1)

## 2021-10-10 LAB — CBC
HCT: 32.3 % — ABNORMAL LOW (ref 39.0–52.0)
Hemoglobin: 11.2 g/dL — ABNORMAL LOW (ref 13.0–17.0)
MCH: 31.3 pg (ref 26.0–34.0)
MCHC: 34.7 g/dL (ref 30.0–36.0)
MCV: 90.2 fL (ref 80.0–100.0)
Platelets: 203 10*3/uL (ref 150–400)
RBC: 3.58 MIL/uL — ABNORMAL LOW (ref 4.22–5.81)
RDW: 13.2 % (ref 11.5–15.5)
WBC: 4.4 10*3/uL (ref 4.0–10.5)
nRBC: 0 % (ref 0.0–0.2)

## 2021-10-10 MED ORDER — OXYCODONE-ACETAMINOPHEN 5-325 MG PO TABS
1.0000 | ORAL_TABLET | Freq: Once | ORAL | Status: AC
  Administered 2021-10-10: 1 via ORAL
  Filled 2021-10-10: qty 1

## 2021-10-10 NOTE — ED Triage Notes (Signed)
Pt stood at facility to use urinal. When finished, pt grabbed bed to sit down and bed gave away and pt fell. Pt is c/o rt leg and wrist and arm  pain. Pt has a shoulder brace on rt side that is treatment from previous stroke he is receiving rehab for.

## 2021-10-10 NOTE — ED Provider Notes (Signed)
[facility] EMERGENCY DEPARTMENT Provider Note   CSN: [id number] Arrival date & time: 10/10/21  1703     History {Add pertinent medical, surgical, social history, OB history to HPI:1} Chief Complaint  Patient presents with   Anthony Ferguson is a 71 y.o. male.   Fall  Patient presents after a fall.  Medical history includes HIV, hepatitis C, DM2, CAD, CVA, HTN, HLD.  He is currently residing at a rehab facility following a stroke.  He has residual right-sided deficits from that stroke.  Earlier today, he was urinating.  He stayed off of his bed and was urinating into a urinal.  When he was finished, he went to get back into the bed.  He when he planted his left hand on the bed, the bed rolled away from him and he subsequently landed on his right side.  He endorses pain in the areas of right knee, right femur, right hip, and right hand wrist and forearm.  While there, he fell while sitting back onto a bed.  He complains of right leg, wrist, and arm pain.  He reports a prior injury to his right shoulder area and does have a immobilization brace on this shoulder.  He denies any worsened pain to this area.  He denies striking his head.     Home Medications Prior to Admission medications   Medication Sig Start Date End Date Taking? Authorizing Provider  amLODipine (NORVASC) 10 MG tablet Take 1 tablet (10 mg total) by mouth daily. 05/26/21   Rinehuls, Early Chars, PA-C  apixaban (ELIQUIS) 5 MG TABS tablet Take 1 tablet (5 mg total) by mouth 2 (two) times daily. 07/24/16   Laqueta Linden, MD  baclofen (LIORESAL) 10 MG tablet Take by mouth. 06/13/21   [provider]  bictegravir-emtricitabine-tenofovir AF (BIKTARVY) 50-200-25 MG TABS tablet Take 1 tablet by mouth daily. 05/26/21   Rinehuls, Early Chars, PA-C  chlorhexidine (PERIDEX) 0.12 % solution 15 mLs by Mouth Rinse route 2 (two) times daily. 05/25/21   Rinehuls, Early Chars, PA-C  diltiazem (CARDIZEM) 30 MG tablet Take 1 tablet (30 mg  total) by mouth every 8 (eight) hours. 05/25/21   Rinehuls, Early Chars, PA-C  feeding supplement, GLUCERNA SHAKE, (GLUCERNA SHAKE) LIQD Take 237 mLs by mouth 3 (three) times daily between meals. 05/25/21   Rinehuls, Early Chars, PA-C  folic acid (FOLVITE) 1 MG tablet Take 1 tablet (1 mg total) by mouth daily. 05/26/21   Rinehuls, David L, PA-C  Glucagon, rDNA, (GLUCAGON EMERGENCY) 1 MG KIT Inject into the muscle. 07/09/21   [provider]  glucose blood test strip Use as instructed 03/14/17   Michel Bickers, MD  hydrALAZINE (APRESOLINE) 50 MG tablet Take 1 tablet (50 mg total) by mouth every 8 (eight) hours. 05/25/21   Rinehuls, David L, PA-C  insulin lispro (HUMALOG) 100 UNIT/ML KwikPen Inject into the skin. 06/13/21   [provider]  lisinopril (ZESTRIL) 10 MG tablet Take 1 tablet (10 mg total) by mouth daily. 05/26/21   Rinehuls, Early Chars, PA-C  Multiple Vitamin (MULTIVITAMIN WITH MINERALS) TABS tablet Take 1 tablet by mouth daily. 05/26/21   Rinehuls, Early Chars, PA-C  omega-3 acid ethyl esters (LOVAZA) 1 g capsule Take 1 capsule by mouth 2 (two) times daily. 06/19/16   [provider]  oxyCODONE (OXY IR/ROXICODONE) 5 MG immediate release tablet Take 5 mg by mouth 2 (two) times daily. 06/15/21   [provider]  pantoprazole sodium (PROTONIX) 40  mg Take 40 mg by mouth at bedtime. 05/25/21   Rinehuls, Early Chars, PA-C  rosuvastatin (CRESTOR) 5 MG tablet Take by mouth. 06/13/21   [provider]  senna-docusate (SENOKOT-S) 8.6-50 MG tablet Place 1 tablet into feeding tube at bedtime as needed for moderate constipation or mild constipation. 05/25/21   Rinehuls, Early Chars, PA-C  thiamine 100 MG tablet Take 1 tablet (100 mg total) by mouth daily. 05/26/21   Rinehuls, Early Chars, PA-C      Allergies    Hctz [hydrochlorothiazide] and Pork-derived products    Review of Systems   Review of Systems  Physical Exam Updated Vital Signs BP (!) 146/97 (BP Location: Left Arm)   Pulse 87   Temp (!)  97.4 F (36.3 C) (Oral)   Resp 19   Ht _0  (1.905 m)   Wt 68 kg   SpO2 97%   BMI 18.75 kg/m  Physical Exam  ED Results / Procedures / Treatments   Labs (all labs ordered are listed, but only abnormal results are displayed) Labs Reviewed - No data to display  EKG None  Radiology No results found.  Procedures Procedures  {Document cardiac monitor, telemetry assessment procedure when appropriate:1}  Medications Ordered in ED Medications - No data to display  ED Course/ Medical Decision Making/ A&P                           Medical Decision Making Amount and/or Complexity of Data Reviewed Labs: ordered. Radiology: ordered.  Risk Prescription drug management.   ***  {Document critical care time when appropriate:1} {Document review of labs and clinical decision tools ie heart score, Chads2Vasc2 etc:1}  {Document your independent review of radiology images, and any outside records:1} {Document your discussion with family members, caretakers, and with consultants:1} {Document social determinants of health affecting pt's care:1} {Document your decision making why or why not admission, treatments were needed:1} Final Clinical Impression(s) / ED Diagnoses Final diagnoses:  None    Rx / DC Orders ED Discharge Orders     None

## 2021-10-11 MED ORDER — OXYCODONE-ACETAMINOPHEN 5-325 MG PO TABS
1.0000 | ORAL_TABLET | Freq: Once | ORAL | Status: AC
  Administered 2021-10-11: 1 via ORAL
  Filled 2021-10-11: qty 1

## 2021-10-11 MED ORDER — POLYETHYLENE GLYCOL 3350 17 G PO PACK: 17.0000 g | PACK | Freq: Every day | ORAL | 0 refills | Status: AC

## 2021-10-11 NOTE — ED Notes (Signed)
Notified [place] [facility] of patient needing transportation back to [facility]. 

## 2021-10-11 NOTE — ED Notes (Signed)
Report attempted x2 to [facility]

## 2021-10-11 NOTE — Discharge Instructions (Signed)
The imaging studies performed today in the emergency department do not show any fractures.  I suspect you will have continued soreness.  Take your home pain medications as needed.  On the CT scan of your hip, did appear you are constipated.  This can be a side effect of narcotic pain medication, which you currently take.  You should balance out narcotic pain medication with a good bowel regimen to avoid constipation.  Continue to take your senna-docusate.  Additionally, a medication called MiraLAX was sent to your pharmacy to also help with constipation a medication to help move your bowels.  Take this as prescribed.  Return to the emergency department at any time for any new or worsening symptoms of concern.

## 2021-11-18 ENCOUNTER — Ambulatory Visit (INDEPENDENT_AMBULATORY_CARE_PROVIDER_SITE_OTHER): Payer: Medicare HMO | Admitting: Orthopedic Surgery

## 2021-11-18 ENCOUNTER — Ambulatory Visit (INDEPENDENT_AMBULATORY_CARE_PROVIDER_SITE_OTHER): Payer: Medicare HMO

## 2021-11-18 ENCOUNTER — Encounter: Payer: Self-pay | Admitting: Orthopedic Surgery

## 2021-11-18 VITALS — Ht 75.0 in | Wt 150.0 lb

## 2021-11-18 DIAGNOSIS — M25511 Pain in right shoulder: Secondary | ICD-10-CM

## 2021-11-18 DIAGNOSIS — M25531 Pain in right wrist: Secondary | ICD-10-CM

## 2021-11-18 NOTE — Progress Notes (Unsigned)
New Patient Visit  Assessment: Anthony Ferguson is a 71 y.o. male with the following: 1. Acute pain of right shoulder 2. Pain in right wrist  Plan: Anthony Ferguson has pain in his right shoulder, and right wrist after a fall, approximately 1 month ago.  Physical exam, as well as x-rays and appears well he has chronic limited use of the right upper extremity.  Follow-up: No follow-ups on file.  Subjective:  Chief Complaint  Patient presents with   Arm Injury    Pt fell on right arm having shoulder and wrist pain. Unable to lift arm or move fingers DOI 10/10/21    History of Present Illness: Anthony Ferguson is a 71 y.o. male who {Presentation:27320} for evaluation of    Review of Systems: No fevers or chills*** No numbness or tingling No chest pain No shortness of breath No bowel or bladder dysfunction No GI distress No headaches   Medical History:  Past Medical History:  Diagnosis Date   HIV (human immunodeficiency virus infection) ([place])    Hypercholesteremia    Hypertension    Type 2 diabetes mellitus ([place])     Past Surgical History:  Procedure Laterality Date   CARDIAC SURGERY     CIRCUMCISION     CORONARY ARTERY BYPASS GRAFT     IR CT HEAD LTD  05/20/2021   IR PERCUTANEOUS ART THROMBECTOMY/INFUSION INTRACRANIAL INC DIAG ANGIO  05/20/2021   RADIOLOGY WITH ANESTHESIA N/A 05/19/2021   Procedure: IR WITH ANESTHESIA;  Surgeon: Luanne Bras, MD;  Location: [place];  Service: Radiology;  Laterality: N/A;    Family History  Problem Relation Age of Onset   Diabetes Mother    Heart failure Mother    Social History   Tobacco Use   Smoking status: Every Day    Packs/day: 0.30    Types: Cigarettes   Smokeless tobacco: Never   Tobacco comments:    Trying to slow down  Vaping Use   Vaping Use: Never used  Substance Use Topics   Alcohol use: Yes    Alcohol/week: 5.0 standard drinks of alcohol    Types: 5 Cans of beer per week    Comment: occas   Drug  use: Not Currently    Types: Cocaine    Comment: last use 07/22/16    Allergies  Allergen Reactions   Hctz [Hydrochlorothiazide] Other (See Comments)    "ran my sugar up"   Pork-Derived Products Palpitations and Hypertension    No outpatient medications have been marked as taking for the 11/18/21 encounter (Office Visit) with Mordecai Rasmussen, MD.    Objective: Ht 6\' 3"  (1.905 m)   Wt 150 lb (68 kg)   BMI 18.75 kg/m   Physical Exam:  General: {General PE Findings:25791} Gait: {Gait:25792}    IMAGING: {XR Reviewed:24899}   New Medications:  No orders of the defined types were placed in this encounter.     Mordecai Rasmussen, MD  11/18/2021 10:38 AM

## 2021-11-19 ENCOUNTER — Encounter: Payer: Self-pay | Admitting: Orthopedic Surgery

## 2021-12-03 ENCOUNTER — Emergency Department (HOSPITAL_COMMUNITY)
Admission: EM | Admit: 2021-12-03 | Discharge: 2021-12-03 | Disposition: A | Discharge status: Skilled Nursing Facility | Payer: Medicare HMO | Attending: Emergency Medicine | Admitting: Emergency Medicine

## 2021-12-03 ENCOUNTER — Other Ambulatory Visit: Payer: Self-pay

## 2021-12-03 ENCOUNTER — Emergency Department (HOSPITAL_COMMUNITY): Payer: Medicare HMO

## 2021-12-03 ENCOUNTER — Encounter (HOSPITAL_COMMUNITY): Payer: Self-pay

## 2021-12-03 DIAGNOSIS — S0990XA Unspecified injury of head, initial encounter: Secondary | ICD-10-CM | POA: Diagnosis present

## 2021-12-03 DIAGNOSIS — G8929 Other chronic pain: Secondary | ICD-10-CM

## 2021-12-03 DIAGNOSIS — Z7901 Long term (current) use of anticoagulants: Secondary | ICD-10-CM | POA: Insufficient documentation

## 2021-12-03 DIAGNOSIS — Z794 Long term (current) use of insulin: Secondary | ICD-10-CM | POA: Diagnosis not present

## 2021-12-03 DIAGNOSIS — W19XXXA Unspecified fall, initial encounter: Secondary | ICD-10-CM

## 2021-12-03 DIAGNOSIS — W06XXXA Fall from bed, initial encounter: Secondary | ICD-10-CM | POA: Insufficient documentation

## 2021-12-03 DIAGNOSIS — M25511 Pain in right shoulder: Secondary | ICD-10-CM | POA: Diagnosis not present

## 2021-12-03 DIAGNOSIS — S0091XA Abrasion of unspecified part of head, initial encounter: Secondary | ICD-10-CM | POA: Diagnosis not present

## 2021-12-03 MED ORDER — ACETAMINOPHEN 500 MG PO TABS
1000.0000 mg | ORAL_TABLET | Freq: Once | ORAL | Status: AC
  Administered 2021-12-03: 1000 mg via ORAL
  Filled 2021-12-03: qty 2

## 2021-12-03 NOTE — ED Triage Notes (Signed)
PT slipped at [facility] and fell on right shoulder - c/o of pain on right shoulder. Pt did not hit head and no LOC.

## 2021-12-03 NOTE — ED Provider Notes (Signed)
[facility] EMERGENCY DEPARTMENT  Provider Note  CSN: [id number] Arrival date & time: 12/03/21 0417  History Chief Complaint  Patient presents with   Anthony Ferguson    Anthony Ferguson is a 71 y.o. male brought by EMS from [facility] after a mechanical fall, he states he got up to use the urinal and fell getting back into bed, landing on R shoulder. Complaining of severe pain to R shoulder, worse with movement. He denied head injury to staff, there is a small abrasion that looks fresh over his R temple that apparently pre-dated the fall tonight however patient told me he did hit his head at some point. He has had trouble with the right shoulder in the past, saw Ortho for same 9/29 after a fall 8/21. At that visit, Dr. Amedeo Kinsman wrote:  "Plan: Anthony Ferguson has pain in his right shoulder, and right wrist after a fall, approximately 1 month ago.  Physical exam, as well as x-rays and appears well he has chronic limited use of the right upper extremity.  Some acute pain in his wrist, negative XR.  Brace for the right wrist.  Medications as needed."       Home Medications Prior to Admission medications   Medication Sig Start Date End Date Taking? Authorizing Provider  amLODipine (NORVASC) 10 MG tablet Take 1 tablet (10 mg total) by mouth daily. 05/26/21   Rinehuls, Early Chars, PA-C  apixaban (ELIQUIS) 5 MG TABS tablet Take 1 tablet (5 mg total) by mouth 2 (two) times daily. 07/24/16   Laqueta Linden, MD  baclofen (LIORESAL) 10 MG tablet Take by mouth. 06/13/21   [provider]  bictegravir-emtricitabine-tenofovir AF (BIKTARVY) 50-200-25 MG TABS tablet Take 1 tablet by mouth daily. 05/26/21   Rinehuls, Early Chars, PA-C  chlorhexidine (PERIDEX) 0.12 % solution 15 mLs by Mouth Rinse route 2 (two) times daily. 05/25/21   Rinehuls, Early Chars, PA-C  diltiazem (CARDIZEM) 30 MG tablet Take 1 tablet (30 mg total) by mouth every 8 (eight) hours. 05/25/21   Rinehuls, Early Chars, PA-C  feeding supplement, GLUCERNA  SHAKE, (GLUCERNA SHAKE) LIQD Take 237 mLs by mouth 3 (three) times daily between meals. 05/25/21   Rinehuls, Early Chars, PA-C  folic acid (FOLVITE) 1 MG tablet Take 1 tablet (1 mg total) by mouth daily. 05/26/21   Rinehuls, David L, PA-C  Glucagon, rDNA, (GLUCAGON EMERGENCY) 1 MG KIT Inject into the muscle. 07/09/21   [provider]  glucose blood test strip Use as instructed 03/14/17   Michel Bickers, MD  hydrALAZINE (APRESOLINE) 50 MG tablet Take 1 tablet (50 mg total) by mouth every 8 (eight) hours. 05/25/21   Rinehuls, David L, PA-C  insulin lispro (HUMALOG) 100 UNIT/ML KwikPen Inject into the skin. 06/13/21   [provider]  lisinopril (ZESTRIL) 10 MG tablet Take 1 tablet (10 mg total) by mouth daily. 05/26/21   Rinehuls, Early Chars, PA-C  Multiple Vitamin (MULTIVITAMIN WITH MINERALS) TABS tablet Take 1 tablet by mouth daily. 05/26/21   Rinehuls, Early Chars, PA-C  omega-3 acid ethyl esters (LOVAZA) 1 g capsule Take 1 capsule by mouth 2 (two) times daily. 06/19/16   [provider]  oxyCODONE (OXY IR/ROXICODONE) 5 MG immediate release tablet Take 5 mg by mouth 2 (two) times daily. 06/15/21   [provider]  pantoprazole sodium (PROTONIX) 40 mg Take 40 mg by mouth at bedtime. 05/25/21   Rinehuls, David L, PA-C  polyethylene glycol (MIRALAX) 17 g packet Take 17 g  by mouth daily. 10/11/21   Godfrey Pick, MD  rosuvastatin (CRESTOR) 5 MG tablet Take by mouth. 06/13/21   [provider]  senna-docusate (SENOKOT-S) 8.6-50 MG tablet Place 1 tablet into feeding tube at bedtime as needed for moderate constipation or mild constipation. 05/25/21   Rinehuls, Early Chars, PA-C  thiamine 100 MG tablet Take 1 tablet (100 mg total) by mouth daily. 05/26/21   Rinehuls, Early Chars, PA-C     Allergies    Hctz [hydrochlorothiazide] and Pork-derived products   Review of Systems   Review of Systems Please see HPI for pertinent positives and negatives  Physical Exam BP (!) 145/96   Pulse 97   Temp  98 F (36.7 C) (Oral)   Resp 18   Ht _0  (1.905 m)   Wt 68 kg   SpO2 98%   BMI 18.74 kg/m   Physical Exam Vitals and nursing note reviewed.  Constitutional:      Appearance: Normal appearance.  HENT:     Head: Normocephalic.     Comments: Superficial abrasion R temple    Nose: Nose normal.     Mouth/Throat:     Mouth: Mucous membranes are moist.  Eyes:     Extraocular Movements: Extraocular movements intact.     Conjunctiva/sclera: Conjunctivae normal.  Cardiovascular:     Rate and Rhythm: Normal rate.  Pulmonary:     Effort: Pulmonary effort is normal.     Breath sounds: Normal breath sounds.  Abdominal:     General: Abdomen is flat.     Palpations: Abdomen is soft.     Tenderness: There is no abdominal tenderness.  Musculoskeletal:        General: Tenderness (R anterior shoulder, limited ROM due to pain, R wrist in old brace) present. No swelling or deformity.     Cervical back: Neck supple. No tenderness.  Skin:    General: Skin is warm and dry.  Neurological:     General: No focal deficit present.     Mental Status: He is alert.  Psychiatric:        Mood and Affect: Mood normal.     ED Results / Procedures / Treatments   EKG None  Procedures Procedures  Medications Ordered in the ED Medications  acetaminophen (TYLENOL) tablet 1,000 mg (1,000 mg Oral Given 12/03/21 0501)    Initial Impression and Plan  Patient here with mechanical fall, unclear whether there was head injury or not, but he is on Eliquis, so will send for CT. Xray of R shoulder as well. No other apparent injuries.   ED Course   Clinical Course as of 12/03/21 0604  Sat Dec 03, 2021  0554 I personally viewed the images from radiology studies and agree with radiologist interpretation: CT and xray are neg for acute injuries. Plan to return to SNF. Continued outpatient ortho management for his shoulder pain. Sling for comfort. Continue APAP, avoid sedating meds which may increase his fall  risk.   [CS]    Clinical Course User Index [CS] Truddie Hidden, MD     MDM Rules/Calculators/A&P Medical Decision Making Problems Addressed: Chronic right shoulder pain: chronic illness or injury with exacerbation, progression, or side effects of treatment Fall, initial encounter: acute illness or injury Injury of head, initial encounter: acute illness or injury  Amount and/or Complexity of Data Reviewed Radiology: ordered and independent interpretation performed. Decision-making details documented in ED Course.  Risk OTC drugs.    Final Clinical Impression(s) / ED  Diagnoses Final diagnoses:  Fall, initial encounter  Injury of head, initial encounter  Chronic right shoulder pain    Rx / DC Orders ED Discharge Orders     None        Truddie Hidden, MD 12/03/21 [phone]

## 2022-01-17 ENCOUNTER — Ambulatory Visit: Payer: Medicare (Managed Care) | Admitting: Internal Medicine

## 2022-01-25 ENCOUNTER — Ambulatory Visit (INDEPENDENT_AMBULATORY_CARE_PROVIDER_SITE_OTHER): Payer: Medicare (Managed Care) | Admitting: Internal Medicine

## 2022-01-25 ENCOUNTER — Ambulatory Visit (INDEPENDENT_AMBULATORY_CARE_PROVIDER_SITE_OTHER): Payer: Medicare (Managed Care)

## 2022-01-25 ENCOUNTER — Other Ambulatory Visit: Payer: Self-pay

## 2022-01-25 ENCOUNTER — Encounter: Payer: Self-pay | Admitting: Internal Medicine

## 2022-01-25 VITALS — BP 130/82 | HR 84 | Temp 97.6°F

## 2022-01-25 DIAGNOSIS — B20 Human immunodeficiency virus [HIV] disease: Secondary | ICD-10-CM | POA: Diagnosis not present

## 2022-01-25 DIAGNOSIS — Z23 Encounter for immunization: Secondary | ICD-10-CM

## 2022-01-25 MED ORDER — BICTEGRAVIR-EMTRICITAB-TENOFOV 50-200-25 MG PO TABS: 1.0000 | ORAL_TABLET | Freq: Every day | ORAL | 11 refills | Status: DC

## 2022-01-25 NOTE — Assessment & Plan Note (Signed)
His infection has been under good, long-term control.  He will get updated blood work today and continue [employer].  He received annual influenza and updated COVID vaccines here today.  He will follow-up in 6 months.

## 2022-01-25 NOTE — Progress Notes (Signed)
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   APIXABAN (ELIQUIS) 5 MG TABS TABLET    Take 1 tablet (5 mg total) by mouth 2 (two) times daily.   BACLOFEN (LIORESAL) 10 MG TABLET    Take by mouth.   CHLORHEXIDINE (PERIDEX) 0.12 % SOLUTION    15 mLs by Mouth Rinse route 2 (two) times daily.   DILTIAZEM (CARDIZEM) 30 MG TABLET    Take 1 tablet (30 mg total) by mouth every 8 (eight) hours.   FEEDING SUPPLEMENT, GLUCERNA SHAKE, (GLUCERNA SHAKE) LIQD    Take 237 mLs by mouth 3 (three) times daily between meals.   FOLIC ACID (FOLVITE) 1 MG TABLET    Take 1 tablet (1 mg total) by mouth daily.   GABAPENTIN (NEURONTIN) 300 MG CAPSULE    Take 300 mg by mouth 3 (three) times daily.   GLIPIZIDE (GLUCOTROL) 10 MG TABLET    Take 10 mg by mouth daily.   GLUCAGON, RDNA, (GLUCAGON EMERGENCY) 1 MG KIT    Inject into the muscle.   GLUCOSE BLOOD TEST STRIP    Use as instructed   HYDRALAZINE (APRESOLINE) 50 MG TABLET    Take 1 tablet (50 mg total) by mouth every 8 (eight) hours.   LISINOPRIL (ZESTRIL) 10 MG TABLET    Take 1 tablet (10 mg total) by mouth daily.   MULTIPLE VITAMIN (MULTIVITAMIN WITH MINERALS) TABS TABLET    Take 1 tablet by mouth daily.   NOVOLOG FLEXPEN  100 UNIT/ML FLEXPEN    Inject into the skin.   OMEGA-3 ACID ETHYL ESTERS (LOVAZA) 1 G CAPSULE    Take 1 capsule by mouth 2 (two) times daily.   OXYCODONE (OXY IR/ROXICODONE) 5 MG IMMEDIATE RELEASE TABLET    Take 5 mg by mouth 2 (two) times daily.   PANTOPRAZOLE SODIUM (PROTONIX) 40 MG    Take 40 mg by mouth at bedtime.   POLYETHYLENE GLYCOL (MIRALAX) 17 G PACKET    Take 17 g by mouth daily.   ROSUVASTATIN (CRESTOR) 5 MG TABLET    Take by mouth.   SENNA-DOCUSATE (SENOKOT-S) 8.6-50 MG TABLET    Place 1 tablet into feeding tube at bedtime as needed for moderate constipation or mild constipation.   THIAMINE 100 MG TABLET    Take 1 tablet (100 mg total) by mouth daily.   TRAMADOL (ULTRAM) 50 MG TABLET    Take 50 mg by mouth 2 (two) times daily as needed.  Modified Medications   Modified Medication Previous Medication   BICTEGRAVIR-EMTRICITABINE-TENOFOVIR AF (BIKTARVY) 50-200-25 MG TABS TABLET bictegravir-emtricitabine-tenofovir AF (BIKTARVY) 50-200-25 MG TABS tablet      Take 1 tablet by mouth daily.    Take 1 tablet  by mouth daily.  Discontinued Medications   INSULIN LISPRO (HUMALOG) 100 UNIT/ML KWIKPEN    Inject into the skin.    Subjective: Anthony Ferguson is in for his routine HIV follow-up visit.  He continues to reside in [facility] facility in [place] following his stroke earlier this year.  He is being given his Biktarvy every day.  He tolerates it well and does not think he has missed any doses.  Review of Systems: Review of Systems  Constitutional:  Negative for fever and weight loss.    Past Medical History:  Diagnosis Date   HIV (human immunodeficiency virus infection) ([place])    Hypercholesteremia    Hypertension    Type 2 diabetes mellitus ([place])     Social History   Tobacco Use   Smoking status: Every Day    Packs/day: 0.30    Types: Cigarettes   Smokeless tobacco: Never   Tobacco comments:    Trying to slow down  Vaping Use   Vaping Use: Never used   Substance Use Topics   Alcohol use: Yes    Alcohol/week: 5.0 standard drinks of alcohol    Types: 5 Cans of beer per week    Comment: occas   Drug use: Not Currently    Types: Cocaine    Comment: last use 07/22/16    Family History  Problem Relation Age of Onset   Diabetes Mother    Heart failure Mother     Allergies  Allergen Reactions   Hctz [Hydrochlorothiazide] Other (See Comments)    "ran my sugar up"   Pork-Derived Products Palpitations and Hypertension    Health Maintenance  Topic Date Due   Medicare Annual Wellness (AWV)  Never done   COVID-19 Vaccine (1) Never done   FOOT EXAM  Never done   OPHTHALMOLOGY EXAM  Never done   Diabetic kidney evaluation - Urine ACR  Never done   Zoster Vaccines- Shingrix (1 of 2) Never done   COLONOSCOPY (Pts 45-66yr Insurance coverage will need to be confirmed)  Never done   Pneumonia Vaccine 73 Years old (3 - PPSV23 or PCV20) 03/20/2019   INFLUENZA VACCINE  09/20/2021   HEMOGLOBIN A1C  11/19/2021   Diabetic kidney evaluation - GFR measurement  10/11/2022   DTaP/Tdap/Td (2 - Td or Tdap) 09/14/2029   Hepatitis C Screening  Completed   HPV VACCINES  Aged Out    Objective:  Vitals:   01/25/22 0956  BP: 130/82  Pulse: 84  Temp: 97.6 F (36.4 C)  TempSrc: Temporal  SpO2: 98%   There is no height or weight on file to calculate BMI.  Physical Exam Constitutional:      Comments: He is very calm and pleasant.  He is seated in his wheelchair.  Cardiovascular:     Rate and Rhythm: Normal rate and regular rhythm.     Heart sounds: No murmur heard. Pulmonary:     Effort: Pulmonary effort is normal.     Breath sounds: Normal breath sounds.  Psychiatric:        Mood and Affect: Mood normal.     Lab Results Lab Results  Component Value Date   WBC 4.4 10/10/2021   HGB 11.2 (L) 10/10/2021   HCT 32.3 (L) 10/10/2021   MCV 90.2 10/10/2021   PLT 203 10/10/2021    Lab Results  Component Value Date   CREATININE 0.69  10/10/2021   BUN 16 10/10/2021   NA 139 10/10/2021   K 3.8 10/10/2021  CL 105 10/10/2021   CO2 26 10/10/2021    Lab Results  Component Value Date   ALT 9 10/10/2021   AST 14 (L) 10/10/2021   GGT 251 (H) 11/16/2016   ALKPHOS 74 10/10/2021   BILITOT 0.6 10/10/2021    Lab Results  Component Value Date   CHOL 141 05/20/2021   HDL 69 05/20/2021   LDLCALC 65 05/20/2021   TRIG 37 05/20/2021   CHOLHDL 2.0 05/20/2021   Lab Results  Component Value Date   LABRPR NON-REACTIVE 12/17/2019   HIV 1 RNA Quant  Date Value  12/17/2019 <20 Copies/mL  11/26/2018 <20 DETECTED copies/mL (A)  03/19/2018 <20 NOT DETECTED copies/mL   CD4 T Cell Abs (/uL)  Date Value  12/17/2019 320 (L)  11/26/2018 375 (L)  03/19/2018 480     Problem List Items Addressed This Visit       High   Human immunodeficiency virus (HIV) disease ([place]) - Primary    His infection has been under good, long-term control.  He will get updated blood work today and continue [employer].  He received annual influenza and updated COVID vaccines here today.  He will follow-up in 6 months.      Relevant Medications   bictegravir-emtricitabine-tenofovir AF (BIKTARVY) 50-200-25 MG TABS tablet   Other Relevant Orders   T-helper cells (CD4) count (not at [facility])   HIV-1 RNA quant-no reflex-bld      Michel Bickers, MD [facility] [place] [place] [phone] pager   [phone] cell 01/25/2022, 10:19 AM

## 2022-01-26 LAB — T-HELPER CELLS (CD4) COUNT (NOT AT ARMC)
CD4 % Helper T Cell: 43 % (ref 33–65)
CD4 T Cell Abs: 449 /uL (ref 400–1790)

## 2022-01-29 LAB — HIV-1 RNA QUANT-NO REFLEX-BLD
HIV 1 RNA Quant: NOT DETECTED Copies/mL
HIV-1 RNA Quant, Log: NOT DETECTED Log cps/mL

## 2022-05-23 ENCOUNTER — Telehealth: Payer: Self-pay

## 2022-05-23 NOTE — Telephone Encounter (Signed)
[facility] called to schedule patient for left hip.  Stated that next week would be better.  CB# [phone].

## 2022-05-30 ENCOUNTER — Encounter: Payer: Self-pay | Admitting: Orthopedic Surgery

## 2022-05-30 ENCOUNTER — Ambulatory Visit: Payer: Medicare (Managed Care) | Admitting: Orthopaedic Surgery

## 2022-05-30 ENCOUNTER — Other Ambulatory Visit (INDEPENDENT_AMBULATORY_CARE_PROVIDER_SITE_OTHER): Payer: Medicare (Managed Care)

## 2022-05-30 ENCOUNTER — Ambulatory Visit: Payer: Medicare (Managed Care) | Admitting: Orthopedic Surgery

## 2022-05-30 DIAGNOSIS — M25552 Pain in left hip: Secondary | ICD-10-CM | POA: Diagnosis not present

## 2022-05-30 NOTE — Progress Notes (Signed)
[facility] Return  Assessment: Anthony Ferguson is a 72 y.o. male with the following: Low back pain, left-sided radicular symptoms  Plan: Mr. Florentine has been having pain in the left side of his lower back, radiating into his left leg for the past 3 weeks.  No specific injury.  No fall.  Per notes from the facility, there was some concern for a fracture within the pelvis, chronicity unknown.  He is also complaining of pain in the lower leg, distal to his knee, which worsens with activity.  This could be a vascular issue.  For the pain in his back, and radicular symptoms, I am recommending prednisone if he can tolerate it.  I will let the facility decide.  I have also recommended physical therapy.  He will return to clinic as needed.   Follow-up: Return if symptoms worsen or fail to improve.   Subjective:  Chief Complaint  Patient presents with   Left hip pain    Pain in the left buttock, radiating distally for the past 3 weeks    History of Present Illness: Anthony Ferguson is a 72 y.o. male who returns to clinic for evaluation of left hip pain.  He has had pain in the left buttock area for the past 3 weeks.  Pain radiates distally.  No fall.  He also reports that he has some pain distal to his knee, but this worsens when he ambulates and works with physical therapy.  Of note, he has had a stroke in the past.  No prior therapies for his lower back and left buttock pain.  Per notes from the facility, there was some concern for a superior pubic rami fracture.  Review of Systems: No fevers or chills No numbness or tingling No chest pain No shortness of breath No bowel or bladder dysfunction No GI distress No headaches   Objective: There were no vitals taken for this visit.  Physical Exam:  Alert and oriented.  No acute distress.  Ambulates with a cane, and wears a brace on his right knee.  Limited function Of his right arm and leg.  Evaluation of left leg demonstrates  no obvious injury.  Almost able to get to full extension in his left knee.  Tenderness to palpation over the left side of his lower back.  Positive straight leg raise on the left.  IMAGING: I personally ordered and reviewed the following images:   AP pelvis was obtained in clinic today.  No acute injuries are noted.  There does appear to be minimal displaced fracture of the superior pubic ramus on the left, but this is not obvious.  No displacement.  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  Impression: AP pelvis with possible superior pubic rami fracture, possibly old.  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   Oliver Barre, MD 05/30/2022 11:54 AM

## 2022-07-12 ENCOUNTER — Emergency Department (HOSPITAL_COMMUNITY): Payer: Medicare (Managed Care)

## 2022-07-12 ENCOUNTER — Inpatient Hospital Stay (HOSPITAL_COMMUNITY)
Admission: EM | Admit: 2022-07-12 | Discharge: 2022-07-16 | DRG: 378 | Disposition: A | Discharge status: Skilled Nursing Facility | Payer: Medicare (Managed Care) | Source: Skilled Nursing Facility | Attending: Family Medicine | Admitting: Family Medicine

## 2022-07-12 ENCOUNTER — Encounter (HOSPITAL_COMMUNITY): Payer: Self-pay | Admitting: Emergency Medicine

## 2022-07-12 ENCOUNTER — Other Ambulatory Visit: Payer: Self-pay

## 2022-07-12 DIAGNOSIS — Z833 Family history of diabetes mellitus: Secondary | ICD-10-CM

## 2022-07-12 DIAGNOSIS — I2510 Atherosclerotic heart disease of native coronary artery without angina pectoris: Secondary | ICD-10-CM | POA: Diagnosis present

## 2022-07-12 DIAGNOSIS — K219 Gastro-esophageal reflux disease without esophagitis: Secondary | ICD-10-CM | POA: Diagnosis present

## 2022-07-12 DIAGNOSIS — Z79899 Other long term (current) drug therapy: Secondary | ICD-10-CM

## 2022-07-12 DIAGNOSIS — E876 Hypokalemia: Secondary | ICD-10-CM | POA: Diagnosis not present

## 2022-07-12 DIAGNOSIS — I4891 Unspecified atrial fibrillation: Secondary | ICD-10-CM | POA: Diagnosis present

## 2022-07-12 DIAGNOSIS — R933 Abnormal findings on diagnostic imaging of other parts of digestive tract: Secondary | ICD-10-CM | POA: Diagnosis not present

## 2022-07-12 DIAGNOSIS — K2970 Gastritis, unspecified, without bleeding: Secondary | ICD-10-CM

## 2022-07-12 DIAGNOSIS — I10 Essential (primary) hypertension: Secondary | ICD-10-CM | POA: Diagnosis present

## 2022-07-12 DIAGNOSIS — E7800 Pure hypercholesterolemia, unspecified: Secondary | ICD-10-CM | POA: Diagnosis present

## 2022-07-12 DIAGNOSIS — E785 Hyperlipidemia, unspecified: Secondary | ICD-10-CM | POA: Diagnosis not present

## 2022-07-12 DIAGNOSIS — F1421 Cocaine dependence, in remission: Secondary | ICD-10-CM | POA: Diagnosis present

## 2022-07-12 DIAGNOSIS — K920 Hematemesis: Secondary | ICD-10-CM

## 2022-07-12 DIAGNOSIS — K5900 Constipation, unspecified: Secondary | ICD-10-CM | POA: Diagnosis present

## 2022-07-12 DIAGNOSIS — Z7901 Long term (current) use of anticoagulants: Secondary | ICD-10-CM | POA: Diagnosis not present

## 2022-07-12 DIAGNOSIS — K2971 Gastritis, unspecified, with bleeding: Secondary | ICD-10-CM | POA: Diagnosis present

## 2022-07-12 DIAGNOSIS — Z28310 Unvaccinated for covid-19: Secondary | ICD-10-CM

## 2022-07-12 DIAGNOSIS — K254 Chronic or unspecified gastric ulcer with hemorrhage: Secondary | ICD-10-CM | POA: Diagnosis present

## 2022-07-12 DIAGNOSIS — Z8249 Family history of ischemic heart disease and other diseases of the circulatory system: Secondary | ICD-10-CM

## 2022-07-12 DIAGNOSIS — Z8673 Personal history of transient ischemic attack (TIA), and cerebral infarction without residual deficits: Secondary | ICD-10-CM | POA: Diagnosis not present

## 2022-07-12 DIAGNOSIS — K922 Gastrointestinal hemorrhage, unspecified: Secondary | ICD-10-CM | POA: Diagnosis present

## 2022-07-12 DIAGNOSIS — K2931 Chronic superficial gastritis with bleeding: Secondary | ICD-10-CM | POA: Insufficient documentation

## 2022-07-12 DIAGNOSIS — B20 Human immunodeficiency virus [HIV] disease: Secondary | ICD-10-CM | POA: Diagnosis present

## 2022-07-12 DIAGNOSIS — Z91014 Allergy to mammalian meats: Secondary | ICD-10-CM

## 2022-07-12 DIAGNOSIS — Z635 Disruption of family by separation and divorce: Secondary | ICD-10-CM | POA: Diagnosis not present

## 2022-07-12 DIAGNOSIS — E119 Type 2 diabetes mellitus without complications: Secondary | ICD-10-CM

## 2022-07-12 DIAGNOSIS — Z7984 Long term (current) use of oral hypoglycemic drugs: Secondary | ICD-10-CM | POA: Diagnosis not present

## 2022-07-12 DIAGNOSIS — D62 Acute posthemorrhagic anemia: Secondary | ICD-10-CM | POA: Diagnosis present

## 2022-07-12 DIAGNOSIS — K2991 Gastroduodenitis, unspecified, with bleeding: Secondary | ICD-10-CM | POA: Diagnosis not present

## 2022-07-12 DIAGNOSIS — Z951 Presence of aortocoronary bypass graft: Secondary | ICD-10-CM

## 2022-07-12 DIAGNOSIS — R000 Tachycardia, unspecified: Secondary | ICD-10-CM | POA: Diagnosis present

## 2022-07-12 DIAGNOSIS — F17210 Nicotine dependence, cigarettes, uncomplicated: Secondary | ICD-10-CM | POA: Diagnosis present

## 2022-07-12 DIAGNOSIS — T18198A Other foreign object in esophagus causing other injury, initial encounter: Secondary | ICD-10-CM | POA: Diagnosis not present

## 2022-07-12 DIAGNOSIS — Z888 Allergy status to other drugs, medicaments and biological substances status: Secondary | ICD-10-CM

## 2022-07-12 DIAGNOSIS — E1165 Type 2 diabetes mellitus with hyperglycemia: Secondary | ICD-10-CM | POA: Diagnosis not present

## 2022-07-12 LAB — URINALYSIS, ROUTINE W REFLEX MICROSCOPIC
Bacteria, UA: NONE SEEN
Bilirubin Urine: NEGATIVE
Glucose, UA: NEGATIVE mg/dL
Hgb urine dipstick: NEGATIVE
Ketones, ur: NEGATIVE mg/dL
Leukocytes,Ua: NEGATIVE
Nitrite: NEGATIVE
Protein, ur: 30 mg/dL — AB
Specific Gravity, Urine: 1.019 (ref 1.005–1.030)
pH: 5 (ref 5.0–8.0)

## 2022-07-12 LAB — COMPREHENSIVE METABOLIC PANEL
ALT: 13 U/L (ref 0–44)
AST: 21 U/L (ref 15–41)
Albumin: 4.3 g/dL (ref 3.5–5.0)
Alkaline Phosphatase: 59 U/L (ref 38–126)
Anion gap: 10 (ref 5–15)
BUN: 17 mg/dL (ref 8–23)
CO2: 24 mmol/L (ref 22–32)
Calcium: 9.5 mg/dL (ref 8.9–10.3)
Chloride: 102 mmol/L (ref 98–111)
Creatinine, Ser: 0.77 mg/dL (ref 0.61–1.24)
GFR, Estimated: >60 mL/min (ref >60)
Glucose, Bld: 196 mg/dL — ABNORMAL HIGH (ref 70–99)
Potassium: 3.3 mmol/L — ABNORMAL LOW (ref 3.5–5.1)
Sodium: 136 mmol/L (ref 135–145)
Total Bilirubin: 0.9 mg/dL (ref 0.3–1.2)
Total Protein: 8.2 g/dL — ABNORMAL HIGH (ref 6.5–8.1)

## 2022-07-12 LAB — CBC WITH DIFFERENTIAL/PLATELET
Abs Immature Granulocytes: 0 10*3/uL (ref 0.00–0.07)
Basophils Absolute: 0 10*3/uL (ref 0.0–0.1)
Basophils Relative: 0 %
Eosinophils Absolute: 0 10*3/uL (ref 0.0–0.5)
Eosinophils Relative: 1 %
HCT: 31.6 % — ABNORMAL LOW (ref 39.0–52.0)
Hemoglobin: 10.5 g/dL — ABNORMAL LOW (ref 13.0–17.0)
Immature Granulocytes: 0 %
Lymphocytes Relative: 13 %
Lymphs Abs: 0.7 10*3/uL (ref 0.7–4.0)
MCH: 30.5 pg (ref 26.0–34.0)
MCHC: 33.2 g/dL (ref 30.0–36.0)
MCV: 91.9 fL (ref 80.0–100.0)
Monocytes Absolute: 0.2 10*3/uL (ref 0.1–1.0)
Monocytes Relative: 5 %
Neutro Abs: 4.2 10*3/uL (ref 1.7–7.7)
Neutrophils Relative %: 81 %
Platelets: 159 10*3/uL (ref 150–400)
RBC: 3.44 MIL/uL — ABNORMAL LOW (ref 4.22–5.81)
RDW: 15 % (ref 11.5–15.5)
WBC: 5.2 10*3/uL (ref 4.0–10.5)
nRBC: 0 % (ref 0.0–0.2)

## 2022-07-12 LAB — CBC
HCT: 28.7 % — ABNORMAL LOW (ref 39.0–52.0)
Hemoglobin: 9.5 g/dL — ABNORMAL LOW (ref 13.0–17.0)
MCH: 30.5 pg (ref 26.0–34.0)
MCHC: 33.1 g/dL (ref 30.0–36.0)
MCV: 92.3 fL (ref 80.0–100.0)
Platelets: 138 10*3/uL — ABNORMAL LOW (ref 150–400)
RBC: 3.11 MIL/uL — ABNORMAL LOW (ref 4.22–5.81)
RDW: 15 % (ref 11.5–15.5)
WBC: 4.5 10*3/uL (ref 4.0–10.5)
nRBC: 0 % (ref 0.0–0.2)

## 2022-07-12 LAB — LIPASE, BLOOD: Lipase: 23 U/L (ref 11–51)

## 2022-07-12 LAB — TYPE AND SCREEN

## 2022-07-12 MED ORDER — MORPHINE SULFATE (PF) 4 MG/ML IV SOLN
4.0000 mg | Freq: Once | INTRAVENOUS | Status: AC
  Administered 2022-07-12: 4 mg via INTRAVENOUS
  Filled 2022-07-12: qty 1

## 2022-07-12 MED ORDER — IOHEXOL 300 MG/ML  SOLN
80.0000 mL | Freq: Once | INTRAMUSCULAR | Status: AC | PRN
  Administered 2022-07-12: 80 mL via INTRAVENOUS

## 2022-07-12 MED ORDER — LACTATED RINGERS IV BOLUS
1000.0000 mL | Freq: Once | INTRAVENOUS | Status: AC
  Administered 2022-07-12: 1000 mL via INTRAVENOUS

## 2022-07-12 MED ORDER — ONDANSETRON HCL 4 MG/2ML IJ SOLN
4.0000 mg | Freq: Once | INTRAMUSCULAR | Status: AC
  Administered 2022-07-12: 4 mg via INTRAVENOUS
  Filled 2022-07-12: qty 2

## 2022-07-12 MED ORDER — PANTOPRAZOLE SODIUM 40 MG IV SOLR
80.0000 mg | Freq: Once | INTRAVENOUS | Status: AC
Start: 2022-07-12 — End: 2022-07-12
  Administered 2022-07-12: 80 mg via INTRAVENOUS
  Filled 2022-07-12: qty 20

## 2022-07-12 MED ORDER — PANTOPRAZOLE INFUSION (NEW) - SIMPLE MED
8.0000 mg/h | INTRAVENOUS | Status: AC
  Administered 2022-07-12 – 2022-07-14 (×5): 8 mg/h via INTRAVENOUS
  Filled 2022-07-12: qty 100
  Filled 2022-07-12: qty 80
  Filled 2022-07-12 (×7): qty 100

## 2022-07-12 MED ORDER — PANTOPRAZOLE SODIUM 40 MG IV SOLR: 40.0000 mg | Freq: Two times a day (BID) | INTRAVENOUS | Status: DC

## 2022-07-12 NOTE — ED Provider Notes (Addendum)
[place] [facility]MENT AT [facility] Provider Note   CSN: [id number] Arrival date & time: 07/12/22  1509     History  Chief Complaint  Patient presents with   Abdominal Pain   Emesis    Anthony Ferguson is a 72 y.o. male.  History of CVA, diabetes, thrombocytopenia comes from [facility] via EMS for dark emesis today with some abdominal pain.  Complaining of constipation, no black stools but having dark vomiting today.  No fevers, no other complaints.   Abdominal Pain Associated symptoms: vomiting   Emesis Associated symptoms: abdominal pain        Home Medications Prior to Admission medications   Medication Sig Start Date End Date Taking? Authorizing Provider  amLODipine (NORVASC) 10 MG tablet Take 1 tablet (10 mg total) by mouth daily. 05/26/21   Rinehuls, Kinnie Scales, PA-C  apixaban (ELIQUIS) 5 MG TABS tablet Take 1 tablet (5 mg total) by mouth 2 (two) times daily. 07/24/16   Langston Reusing, MD  baclofen (LIORESAL) 10 MG tablet Take by mouth. Patient not taking: Reported on 01/25/2022 06/13/21   [provider]  bictegravir-emtricitabine-tenofovir AF (BIKTARVY) 50-200-25 MG TABS tablet Take 1 tablet by mouth daily. 01/25/22   Cliffton Asters, MD  chlorhexidine (PERIDEX) 0.12 % solution 15 mLs by Mouth Rinse route 2 (two) times daily. 05/25/21   Rinehuls, Kinnie Scales, PA-C  diltiazem (CARDIZEM) 30 MG tablet Take 1 tablet (30 mg total) by mouth every 8 (eight) hours. 05/25/21   Rinehuls, Kinnie Scales, PA-C  feeding supplement, GLUCERNA SHAKE, (GLUCERNA SHAKE) LIQD Take 237 mLs by mouth 3 (three) times daily between meals. 05/25/21   Rinehuls, Kinnie Scales, PA-C  folic acid (FOLVITE) 1 MG tablet Take 1 tablet (1 mg total) by mouth daily. 05/26/21   Rinehuls, Kinnie Scales, PA-C  gabapentin (NEURONTIN) 300 MG capsule Take 300 mg by mouth 3 (three) times daily. 01/19/22   [provider]  glipiZIDE (GLUCOTROL) 10 MG tablet Take 10 mg by mouth daily. 12/30/21    [provider]  Glucagon, rDNA, (GLUCAGON EMERGENCY) 1 MG KIT Inject into the muscle. 07/09/21   [provider]  glucose blood test strip Use as instructed 03/14/17   Cliffton Asters, MD  hydrALAZINE (APRESOLINE) 50 MG tablet Take 1 tablet (50 mg total) by mouth every 8 (eight) hours. 05/25/21   Rinehuls, David L, PA-C  lisinopril (ZESTRIL) 10 MG tablet Take 1 tablet (10 mg total) by mouth daily. Patient not taking: Reported on 01/25/2022 05/26/21   Rinehuls, Kinnie Scales, PA-C  Multiple Vitamin (MULTIVITAMIN WITH MINERALS) TABS tablet Take 1 tablet by mouth daily. Patient not taking: Reported on 01/25/2022 05/26/21   Rinehuls, Kinnie Scales, PA-C  NOVOLOG FLEXPEN 100 UNIT/ML FlexPen Inject into the skin. 11/22/21   [provider]  omega-3 acid ethyl esters (LOVAZA) 1 g capsule Take 1 capsule by mouth 2 (two) times daily. 06/19/16   [provider]  oxyCODONE (OXY IR/ROXICODONE) 5 MG immediate release tablet Take 5 mg by mouth 2 (two) times daily. Patient not taking: Reported on 01/25/2022 06/15/21   [provider]  pantoprazole sodium (PROTONIX) 40 mg Take 40 mg by mouth at bedtime. Patient not taking: Reported on 01/25/2022 05/25/21   Rinehuls, Kinnie Scales, PA-C  polyethylene glycol (MIRALAX) 17 g packet Take 17 g by mouth daily. 10/11/21   Gloris Manchester, MD  rosuvastatin (CRESTOR) 5 MG tablet Take by mouth. 06/13/21   [provider]  senna-docusate (SENOKOT-S)  8.6-50 MG tablet Place 1 tablet into feeding tube at bedtime as needed for moderate constipation or mild constipation. 05/25/21   Rinehuls, Kinnie Scales, PA-C  thiamine 100 MG tablet Take 1 tablet (100 mg total) by mouth daily. 05/26/21   Rinehuls, Kinnie Scales, PA-C  traMADol (ULTRAM) 50 MG tablet Take 50 mg by mouth 2 (two) times daily as needed. 01/14/22   [provider]      Allergies    Hctz [hydrochlorothiazide] and Pork-derived products    Review of Systems   Review of Systems  Gastrointestinal:  Positive  for abdominal pain and vomiting.    Physical Exam Updated Vital Signs BP 132/74   Pulse 96   Resp 18   Ht 6\' 3"  (1.905 m)   Wt 68 kg   SpO2 95%   BMI 18.74 kg/m  Physical Exam Vitals and nursing note reviewed.  Constitutional:      General: He is not in acute distress.    Appearance: He is well-developed.  HENT:     Head: Normocephalic and atraumatic.  Eyes:     Conjunctiva/sclera: Conjunctivae normal.  Cardiovascular:     Rate and Rhythm: Normal rate and regular rhythm.     Heart sounds: No murmur heard. Pulmonary:     Effort: Pulmonary effort is normal. No respiratory distress.     Breath sounds: Normal breath sounds.  Abdominal:     General: Abdomen is flat.     Palpations: Abdomen is soft.     Tenderness: There is abdominal tenderness in the periumbilical area.  Genitourinary:    Rectum: Normal. Guaiac result negative.  Musculoskeletal:        General: No swelling.     Cervical back: Neck supple.  Skin:    General: Skin is warm and dry.     Capillary Refill: Capillary refill takes less than 2 seconds.  Neurological:     General: No focal deficit present.     Mental Status: He is alert.  Psychiatric:        Mood and Affect: Mood normal.     ED Results / Procedures / Treatments   Labs (all labs ordered are listed, but only abnormal results are displayed) Labs Reviewed  CBC WITH DIFFERENTIAL/PLATELET - Abnormal; Notable for the following components:      Result Value   RBC 3.44 (*)    Hemoglobin 10.5 (*)    HCT 31.6 (*)    All other components within normal limits  COMPREHENSIVE METABOLIC PANEL - Abnormal; Notable for the following components:   Potassium 3.3 (*)    Glucose, Bld 196 (*)    Total Protein 8.2 (*)    All other components within normal limits  URINALYSIS, ROUTINE W REFLEX MICROSCOPIC - Abnormal; Notable for the following components:   Protein, ur 30 (*)    All other components within normal limits  LIPASE, BLOOD     EKG None  Radiology No results found.  Procedures Procedures    Medications Ordered in ED Medications  pantoprozole (PROTONIX) 80 mg /NS 100 mL infusion (has no administration in time range)  pantoprazole (PROTONIX) injection 40 mg (has no administration in time range)  ondansetron (ZOFRAN) injection 4 mg (4 mg Intravenous Given 07/12/22 1818)  lactated ringers bolus 1,000 mL (1,000 mLs Intravenous New Bag/Given 07/12/22 1822)  pantoprazole (PROTONIX) injection 80 mg (80 mg Intravenous Given 07/12/22 1818)  morphine (PF) 4 MG/ML injection 4 mg (4 mg Intravenous Given 07/12/22 1817)    ED  Course/ Medical Decision Making/ A&P                             Medical Decision Making This patient presents to the ED for concern of dark urine, today, abdominal pain, this involves an extensive number of treatment options, and is a complaint that carries with it a high risk of complications and morbidity.  The differential diagnosis includes upper GI bleed, lower GI bleed, bowel obstruction,  Co morbidities that complicate the patient evaluation  CVA, On Eliquis,  HIV   Additional history obtained:  Additional history obtained from EMR External records from outside source obtained and reviewed including prior notes, med list   Lab Tests:  I Ordered, and personally interpreted labs.  The pertinent results include: Hemoglobin slightly lower than baseline at 10.5, no leukocytosis, no UTI, CMP is slightly decreased potassium, normal renal function, normal LFTs  Having hematemesis, on Eliquis, will admit for possible scope, signed out to Dr. Rhunette Croft pending hospitalist consult. He spoke with Dr. Jena Gauss who requested infusion of protonix  Amount and/or Complexity of Data Reviewed Labs: ordered. Radiology: ordered.  Risk Prescription drug management. Decision regarding hospitalization.           Final Clinical Impression(s) / ED Diagnoses Final diagnoses:  Hematemesis,  unspecified whether nausea present    Rx / DC Orders ED Discharge Orders     None         Ma Rings, PA-C 07/12/22 [address] 07/12/22 2006    Nanavati, Ankit, MD 07/12/22 1610    Derwood Kaplan, MD 07/12/22 2315

## 2022-07-12 NOTE — ED Triage Notes (Signed)
Facility called ems due to abd pain and what appeared to be black vomit. Pt reports a pain of 8/10 in bottom left quadrant.

## 2022-07-12 NOTE — ED Notes (Signed)
Pt had episode of dark emesis. Pt cleaned and linen changed.

## 2022-07-12 NOTE — ED Triage Notes (Signed)
Pt BIB [facility] from [facility] for vomiting dark emesis  Pt c/o lower abdomen  Pt has hx of dementia and poor historian

## 2022-07-13 ENCOUNTER — Inpatient Hospital Stay (HOSPITAL_COMMUNITY): Payer: Medicare (Managed Care)

## 2022-07-13 DIAGNOSIS — E876 Hypokalemia: Secondary | ICD-10-CM

## 2022-07-13 DIAGNOSIS — K922 Gastrointestinal hemorrhage, unspecified: Secondary | ICD-10-CM

## 2022-07-13 DIAGNOSIS — B20 Human immunodeficiency virus [HIV] disease: Secondary | ICD-10-CM

## 2022-07-13 DIAGNOSIS — R933 Abnormal findings on diagnostic imaging of other parts of digestive tract: Secondary | ICD-10-CM

## 2022-07-13 DIAGNOSIS — E785 Hyperlipidemia, unspecified: Secondary | ICD-10-CM

## 2022-07-13 DIAGNOSIS — E1165 Type 2 diabetes mellitus with hyperglycemia: Secondary | ICD-10-CM

## 2022-07-13 DIAGNOSIS — I10 Essential (primary) hypertension: Secondary | ICD-10-CM | POA: Diagnosis present

## 2022-07-13 DIAGNOSIS — K219 Gastro-esophageal reflux disease without esophagitis: Secondary | ICD-10-CM | POA: Diagnosis not present

## 2022-07-13 LAB — CBC WITH DIFFERENTIAL/PLATELET
Abs Immature Granulocytes: 0.02 10*3/uL (ref 0.00–0.07)
Basophils Absolute: 0 10*3/uL (ref 0.0–0.1)
Basophils Relative: 0 %
Eosinophils Absolute: 0 10*3/uL (ref 0.0–0.5)
Eosinophils Relative: 0 %
HCT: 27.4 % — ABNORMAL LOW (ref 39.0–52.0)
Hemoglobin: 9 g/dL — ABNORMAL LOW (ref 13.0–17.0)
Immature Granulocytes: 1 %
Lymphocytes Relative: 13 %
Lymphs Abs: 0.6 10*3/uL — ABNORMAL LOW (ref 0.7–4.0)
MCH: 29.9 pg (ref 26.0–34.0)
MCHC: 32.8 g/dL (ref 30.0–36.0)
MCV: 91 fL (ref 80.0–100.0)
Monocytes Absolute: 0.3 10*3/uL (ref 0.1–1.0)
Monocytes Relative: 8 %
Neutro Abs: 3.5 10*3/uL (ref 1.7–7.7)
Neutrophils Relative %: 78 %
Platelets: 134 10*3/uL — ABNORMAL LOW (ref 150–400)
RBC: 3.01 MIL/uL — ABNORMAL LOW (ref 4.22–5.81)
RDW: 15 % (ref 11.5–15.5)
WBC: 4.4 10*3/uL (ref 4.0–10.5)
nRBC: 0 % (ref 0.0–0.2)

## 2022-07-13 LAB — COMPREHENSIVE METABOLIC PANEL
ALT: 13 U/L (ref 0–44)
AST: 16 U/L (ref 15–41)
Albumin: 3.7 g/dL (ref 3.5–5.0)
Alkaline Phosphatase: 45 U/L (ref 38–126)
Anion gap: 9 (ref 5–15)
BUN: 21 mg/dL (ref 8–23)
CO2: 23 mmol/L (ref 22–32)
Calcium: 8.9 mg/dL (ref 8.9–10.3)
Chloride: 105 mmol/L (ref 98–111)
Creatinine, Ser: 0.62 mg/dL (ref 0.61–1.24)
GFR, Estimated: >60 mL/min (ref >60)
Glucose, Bld: 170 mg/dL — ABNORMAL HIGH (ref 70–99)
Potassium: 3.3 mmol/L — ABNORMAL LOW (ref 3.5–5.1)
Sodium: 137 mmol/L (ref 135–145)
Total Bilirubin: 1 mg/dL (ref 0.3–1.2)
Total Protein: 7.3 g/dL (ref 6.5–8.1)

## 2022-07-13 LAB — GLUCOSE, CAPILLARY
Glucose-Capillary: 152 mg/dL — ABNORMAL HIGH (ref 70–99)
Glucose-Capillary: 119 mg/dL — ABNORMAL HIGH (ref 70–99)
Glucose-Capillary: 95 mg/dL (ref 70–99)
Glucose-Capillary: 100 mg/dL — ABNORMAL HIGH (ref 70–99)

## 2022-07-13 LAB — HEMOGLOBIN AND HEMATOCRIT, BLOOD
HCT: 28 % — ABNORMAL LOW (ref 39.0–52.0)
HCT: 25.9 % — ABNORMAL LOW (ref 39.0–52.0)
HCT: 26.3 % — ABNORMAL LOW (ref 39.0–52.0)
Hemoglobin: 9.1 g/dL — ABNORMAL LOW (ref 13.0–17.0)
Hemoglobin: 8.9 g/dL — ABNORMAL LOW (ref 13.0–17.0)
Hemoglobin: 8.8 g/dL — ABNORMAL LOW (ref 13.0–17.0)

## 2022-07-13 LAB — TYPE AND SCREEN
ABO/RH(D): A POS
Antibody Screen: NEGATIVE

## 2022-07-13 LAB — MAGNESIUM: Magnesium: 1.7 mg/dL (ref 1.7–2.4)

## 2022-07-13 MED ORDER — INSULIN ASPART 100 UNIT/ML IJ SOLN: 0.0000 [IU] | Freq: Every day | INTRAMUSCULAR | Status: DC

## 2022-07-13 MED ORDER — POTASSIUM CHLORIDE 10 MEQ/100ML IV SOLN
10.0000 meq | INTRAVENOUS | Status: AC
  Administered 2022-07-13 (×2): 10 meq via INTRAVENOUS
  Filled 2022-07-13 (×2): qty 100

## 2022-07-13 MED ORDER — INSULIN ASPART 100 UNIT/ML IJ SOLN
0.0000 [IU] | Freq: Three times a day (TID) | INTRAMUSCULAR | Status: DC
  Administered 2022-07-15 – 2022-07-16 (×3): 2 [IU] via SUBCUTANEOUS

## 2022-07-13 MED ORDER — ACETAMINOPHEN 650 MG RE SUPP: 650.0000 mg | Freq: Four times a day (QID) | RECTAL | Status: DC | PRN

## 2022-07-13 MED ORDER — MAGNESIUM SULFATE 2 GM/50ML IV SOLN
2.0000 g | Freq: Once | INTRAVENOUS | Status: AC
  Administered 2022-07-13: 2 g via INTRAVENOUS
  Filled 2022-07-13: qty 50

## 2022-07-13 MED ORDER — HYDRALAZINE HCL 20 MG/ML IJ SOLN: 10.0000 mg | Freq: Three times a day (TID) | INTRAMUSCULAR | Status: DC | PRN

## 2022-07-13 MED ORDER — ACETAMINOPHEN 325 MG PO TABS: 650.0000 mg | ORAL_TABLET | Freq: Four times a day (QID) | ORAL | Status: DC | PRN

## 2022-07-13 MED ORDER — MORPHINE SULFATE (PF) 2 MG/ML IV SOLN: 2.0000 mg | INTRAVENOUS | Status: DC | PRN

## 2022-07-13 MED ORDER — PANTOPRAZOLE SODIUM 40 MG IV SOLR
INTRAVENOUS | Status: AC
  Filled 2022-07-13: qty 20

## 2022-07-13 MED ORDER — ONDANSETRON HCL 4 MG PO TABS: 4.0000 mg | ORAL_TABLET | Freq: Four times a day (QID) | ORAL | Status: DC | PRN

## 2022-07-13 MED ORDER — ONDANSETRON HCL 4 MG/2ML IJ SOLN: 4.0000 mg | Freq: Four times a day (QID) | INTRAMUSCULAR | Status: DC | PRN

## 2022-07-13 NOTE — Consult Note (Signed)
Gastroenterology Consult   Referring Provider: No ref. provider found Primary Care Physician:  Patient, No Pcp Per Primary Gastroenterologist: Unassigned  Patient ID: Anthony Ferguson; [id number]; 26-May-1950   Admit date: 07/12/2022  LOS: 1 day   Date of Consultation: 07/13/2022  Reason for Consultation: GI bleed    History of Present Illness   Anthony Ferguson is a 72 y.o. male with history of HIV (followed by Dr. Orvan Falconer with infectious disease), hepatitis C treated with Epclusa with documented SVR 2019, stroke, prior cocaine abuse, prior history of alcohol use but patient cannot quantify, hypertension, diabetes, CAD status post CABG, Afib chronically anticoagulated with Eliquis presenting with abdominal pain, nausea/vomiting, melena.  Patient reports abdominal pain for 2 days.  Reported melena Sunday night.  Several episodes of coffee-ground emesis, one witnessed in the emergency department, picture noted in chart.  NG tube placed, small amt of dark black contents noted.  Patient denied any prior GI workup. He denies constipation, diarrhea, brbpr, dysphagia. Some heartburn controlled with medication.   In the ED yesterday his white blood cell count was 5200, hemoglobin 10.4 down from 11.29 months ago, platelets 159,000, potassium 3.3, BUN 17, creatinine 0.77, albumin 4.3, total bilirubin 0.9, alk phos 59, AST 21, ALT 13.  Today's hemoglobin is 9.0.  Platelets 134,000.  CT abdomen pelvis with contrast yesterday showed moderate fluid distention of the stomach with fluid distended distal esophagus, no dilated small bowel to suggest small bowel obstruction.  Moderate feces in the distal colon with large retained feces at the rectum, rectal distention up to 9.1 cm.   Prior to Admission medications   Medication Sig Start Date End Date Taking? Authorizing Provider  amLODipine (NORVASC) 10 MG tablet Take 1 tablet (10 mg total) by mouth daily. 05/26/21  Yes Rinehuls, Kinnie Scales, PA-C  apixaban  (ELIQUIS) 5 MG TABS tablet Take 1 tablet (5 mg total) by mouth 2 (two) times daily. 07/24/16  Yes Langston Reusing, MD  bictegravir-emtricitabine-tenofovir AF (BIKTARVY) 50-200-25 MG TABS tablet Take 1 tablet by mouth daily. 01/25/22  Yes Cliffton Asters, MD  chlorhexidine (PERIDEX) 0.12 % solution 15 mLs by Mouth Rinse route 2 (two) times daily. 05/25/21  Yes Rinehuls, Kinnie Scales, PA-C  feeding supplement, GLUCERNA SHAKE, (GLUCERNA SHAKE) LIQD Take 237 mLs by mouth 3 (three) times daily between meals. 05/25/21  Yes Rinehuls, Kinnie Scales, PA-C  folic acid (FOLVITE) 1 MG tablet Take 1 tablet (1 mg total) by mouth daily. 05/26/21  Yes Rinehuls, Kinnie Scales, PA-C  gabapentin (NEURONTIN) 300 MG capsule Take 600 mg by mouth 3 (three) times daily. 01/19/22  Yes [provider]  glipiZIDE (GLUCOTROL) 10 MG tablet Take 10 mg by mouth daily. 12/30/21  Yes [provider]  Glucagon, rDNA, (GLUCAGON EMERGENCY) 1 MG KIT Inject into the muscle. 07/09/21  Yes [provider]  hydrALAZINE (APRESOLINE) 50 MG tablet Take 1 tablet (50 mg total) by mouth every 8 (eight) hours. 05/25/21  Yes Rinehuls, Kinnie Scales, PA-C  HYDROcodone-acetaminophen (NORCO/VICODIN) 5-325 MG tablet Take 1 tablet by mouth 4 (four) times daily.   Yes [provider]  Menthol, Topical Analgesic, (BIOFREEZE) 4 % GEL Apply 1 Application topically every 8 (eight) hours as needed (left leg pain).   Yes [provider]  omega-3 acid ethyl esters (LOVAZA) 1 g capsule Take 1 capsule by mouth 2 (two) times daily. 06/19/16  Yes [provider]  omeprazole (PRILOSEC) 20 MG capsule Take 20 mg by mouth daily.   Yes  [provider]  polyethylene glycol (MIRALAX) 17 g packet Take 17 g by mouth daily. 10/11/21  Yes Gloris Manchester, MD  rosuvastatin (CRESTOR) 5 MG tablet Take by mouth. 06/13/21  Yes [provider]  senna-docusate (SENOKOT-S) 8.6-50 MG tablet Place 1 tablet into feeding tube at bedtime as needed for  moderate constipation or mild constipation. Patient taking differently: Place 1 tablet into feeding tube every 12 (twelve) hours as needed for moderate constipation or mild constipation. 05/25/21  Yes Rinehuls, Kinnie Scales, PA-C  thiamine 100 MG tablet Take 1 tablet (100 mg total) by mouth daily. 05/26/21  Yes Rinehuls, Kinnie Scales, PA-C  glucose blood test strip Use as instructed 03/14/17   Cliffton Asters, MD    Current Facility-Administered Medications  Medication Dose Route Frequency Provider Last Rate Last Admin   acetaminophen (TYLENOL) tablet 650 mg  650 mg Oral Q6H PRN Zierle-Ghosh, Asia B, DO       Or   acetaminophen (TYLENOL) suppository 650 mg  650 mg Rectal Q6H PRN Zierle-Ghosh, Asia B, DO       hydrALAZINE (APRESOLINE) injection 10 mg  10 mg Intravenous Q8H PRN Zierle-Ghosh, Asia B, DO       insulin aspart (novoLOG) injection 0-15 Units  0-15 Units Subcutaneous TID WC Zierle-Ghosh, Asia B, DO       insulin aspart (novoLOG) injection 0-5 Units  0-5 Units Subcutaneous QHS Zierle-Ghosh, Asia B, DO       magnesium sulfate IVPB 2 g 50 mL  2 g Intravenous Once Johnson, Clanford L, MD 50 mL/hr at 07/13/22 1032 2 g at 07/13/22 1032   morphine (PF) 2 MG/ML injection 2 mg  2 mg Intravenous Q2H PRN Zierle-Ghosh, Asia B, DO       ondansetron (ZOFRAN) tablet 4 mg  4 mg Oral Q6H PRN Zierle-Ghosh, Asia B, DO       Or   ondansetron (ZOFRAN) injection 4 mg  4 mg Intravenous Q6H PRN Zierle-Ghosh, Asia B, DO       pantoprozole (PROTONIX) 80 mg /NS 100 mL infusion  8 mg/hr Intravenous Continuous Rhunette Croft, Ankit, MD 10 mL/hr at 07/13/22 0252 8 mg/hr at 07/13/22 0252    Allergies as of 07/12/2022 - Review Complete 07/12/2022  Allergen Reaction Noted   Hctz [hydrochlorothiazide] Other (See Comments) 11/02/2014   Pork-derived products Palpitations and Hypertension 07/23/2016    Past Medical History:  Diagnosis Date   HIV (human immunodeficiency virus infection) (HCC)    Hypercholesteremia    Hypertension     Type 2 diabetes mellitus (HCC)     Past Surgical History:  Procedure Laterality Date   CARDIAC SURGERY     CIRCUMCISION     CORONARY ARTERY BYPASS GRAFT     IR CT HEAD LTD  05/20/2021   IR PERCUTANEOUS ART THROMBECTOMY/INFUSION INTRACRANIAL INC DIAG ANGIO  05/20/2021   RADIOLOGY WITH ANESTHESIA N/A 05/19/2021   Procedure: IR WITH ANESTHESIA;  Surgeon: Julieanne Cotton, MD;  Location: [facility] OR;  Service: Radiology;  Laterality: N/A;    Family History  Problem Relation Age of Onset   Diabetes Mother    Heart failure Mother     Social History   Socioeconomic History   Marital status: Legally Separated    Spouse name: Not on file   Number of children: Not on file   Years of education: Not on file   Highest education level: Not on file  Occupational History   Not on file  Tobacco Use   Smoking status: Every  Day    Packs/day: .3    Types: Cigarettes   Smokeless tobacco: Never   Tobacco comments:    Trying to slow down  Vaping Use   Vaping Use: Never used  Substance and Sexual Activity   Alcohol use: Yes    Alcohol/week: 5.0 standard drinks of alcohol    Types: 5 Cans of beer per week    Comment: occas   Drug use: Not Currently    Types: Cocaine    Comment: last use 07/22/16   Sexual activity: Not Currently    Partners: Female    Comment: condoms declined  Other Topics Concern   Not on file  Social History Narrative   Not on file   Social Determinants of Health   Financial Resource Strain: Not on file  Food Insecurity: Not on file  Transportation Needs: Not on file  Physical Activity: Not on file  Stress: Not on file  Social Connections: Not on file  Intimate Partner Violence: Not on file     Review of System:   General: Negative for anorexia, weight loss, fever, chills, fatigue, +weakness. Has not slept well in two nights.  Eyes: Negative for vision changes.  ENT: Negative for hoarseness, difficulty swallowing , nasal congestion. CV: Negative for chest pain,  angina, palpitations, dyspnea on exertion, peripheral edema.  Respiratory: Negative for dyspnea at rest, dyspnea on exertion, cough, sputum, wheezing.  GI: See history of present illness. GU:  Negative for dysuria, hematuria, urinary incontinence, urinary frequency, nocturnal urination.  MS: Negative for joint pain, low back pain.  Derm: Negative for rash or itching.  Neuro: Negative for seizure, frequent headaches, memory loss, confusion. Right sided weakness since stroke.  Psych: Negative for anxiety, depression, suicidal ideation, hallucinations.  Endo: Negative for unusual weight change.  Heme: Negative for bruising or bleeding. Allergy: Negative for rash or hives.      Physical Examination:   Vital signs in last 24 hours: Temp:  [98.1 F (36.7 C)-99 F (37.2 C)] 99 F (37.2 C) (05/23 0926) Pulse Rate:  [93-105] 95 (05/23 0926) Resp:  [15-29] 20 (05/23 0926) BP: (121-159)/(72-96) 121/84 (05/23 0926) SpO2:  [94 %-100 %] 100 % (05/23 0926) Weight:  [68 kg-68.3 kg] 68.3 kg (05/23 0006) Last BM Date : 07/12/22  General: Well-nourished, well-developed in no acute distress. NGT in place with scant black contents in the cannister Head: Normocephalic, atraumatic.   Eyes: Conjunctiva pink, no icterus. Mouth: Oropharyngeal mucosa moist and pink   Neck: Supple without thyromegaly, masses, or lymphadenopathy.  Lungs: Clear to auscultation bilaterally.  Heart: Regular rate and rhythm, no murmurs rubs or gallops.  Abdomen: Bowel sounds are normal, nontender, nondistended, no hepatosplenomegaly or masses, no abdominal bruits or hernia , no rebound or guarding.   Rectal: not performed Extremities: No lower extremity edema, clubbing, deformity.  Neuro: Alert and oriented x 4 , grossly normal neurologically.  Skin: Warm and dry, no rash or jaundice.   Psych: Alert and cooperative, normal mood and affect.        Intake/Output from previous day: 05/22 0701 - 05/23 0700 In: 1000 [IV  Piggyback:1000] Out: 250 [Urine:250] Intake/Output this shift: No intake/output data recorded.  Lab Results:   CBC Recent Labs    07/12/22 1615 07/12/22 2307 07/13/22 0418  WBC 5.2 4.5 4.4  HGB 10.5* 9.5* 9.0*  HCT 31.6* 28.7* 27.4*  MCV 91.9 92.3 91.0  PLT 159 138* 134*   BMET Recent Labs    07/12/22 1615 07/13/22 0418  NA 136 137  K 3.3* 3.3*  CL 102 105  CO2 24 23  GLUCOSE 196* 170*  BUN 17 21  CREATININE 0.77 0.62  CALCIUM 9.5 8.9   LFT Recent Labs    07/12/22 1615 07/13/22 0418  BILITOT 0.9 1.0  ALKPHOS 59 45  AST 21 16  ALT 13 13  PROT 8.2* 7.3  ALBUMIN 4.3 3.7    Lipase Recent Labs    07/12/22 1615  LIPASE 23    PT/INR No results for input(s): "LABPROT", "INR" in the last 72 hours.   Hepatitis Panel No results for input(s): "HEPBSAG", "HCVAB", "HEPAIGM", "HEPBIGM" in the last 72 hours.   Imaging Studies:   DG Chest Port 1 View  Result Date: 07/13/2022 CLINICAL DATA:  NG placement. EXAM: PORTABLE CHEST 1 VIEW COMPARISON:  Chest radiograph dated 10/10/2021. FINDINGS: Enteric tube with tip in the distal esophagus and side port in the mid esophagus. Recommend further advancing by additional 15 cm. Minimal left lung base atelectasis. No focal consolidation, pleural effusion, or pneumothorax. Mild cardiomegaly. Atherosclerotic calcification of the aorta. Median sternotomy wires and CABG vascular clips. No acute osseous pathology. IMPRESSION: Enteric tube with tip in the distal esophagus. Recommend further advancing by additional 15 cm. Electronically Signed   By: Elgie Collard M.D.   On: 07/13/2022 01:22   CT ABDOMEN PELVIS W CONTRAST  Result Date: 07/12/2022 CLINICAL DATA:  Low abdomen pain EXAM: CT ABDOMEN AND PELVIS WITH CONTRAST TECHNIQUE: Multidetector CT imaging of the abdomen and pelvis was performed using the standard protocol following bolus administration of intravenous contrast. RADIATION DOSE REDUCTION: This exam was performed  according to the departmental dose-optimization program which includes automated exposure control, adjustment of the mA and/or kV according to patient size and/or use of iterative reconstruction technique. CONTRAST:  80mL OMNIPAQUE IOHEXOL 300 MG/ML  SOLN COMPARISON:  Radiograph 07/20/2021 FINDINGS: Lower chest: Lung bases demonstrate no acute airspace disease. Coronary vascular calcification. Fluid-filled distal esophageal hernia. Hepatobiliary: Punctate calcification at the gallbladder fundus. No biliary dilatation. Pancreas: Unremarkable. No pancreatic ductal dilatation or surrounding inflammatory changes. Spleen: Normal in size without focal abnormality. Adrenals/Urinary Tract: Adrenal glands are unremarkable. Kidneys are normal, without renal calculi, focal lesion, or hydronephrosis. Bladder is unremarkable. Stomach/Bowel: Moderate fluid distension of the stomach. No dilated small bowel to suggest small bowel obstruction. Fluid within the colon. Generous sized appendix but with air throughout. Moderate feces in the distal colon with large retained feces at the rectum, rectal distension up to 9.1 cm by formed feces. Mild perirectal thickening without edema. Vascular/Lymphatic: Moderate aortic atherosclerosis. No aneurysm. No suspicious lymph nodes Reproductive: Prostate is unremarkable. Other: Negative for pelvic effusion or free air Musculoskeletal: No acute or suspicious osseous abnormality IMPRESSION: 1. Moderate fluid distension of the stomach with fluid distended distal esophagus. NG decompression as indicated. 2. No evidence for dilated small bowel to suggest bowel obstruction. Fluid within the colon. Large feces in the distal colon with marked rectal distension by formed stool with mild perirectal wall thickening. 3. Punctate calcification at the gallbladder fundus, possible small stone. 4. Aortic atherosclerosis. Aortic Atherosclerosis (ICD10-I70.0). Electronically Signed   By: Jasmine Pang M.D.   On:  07/12/2022 19:54  [4 week]  Assessment:   Pleasant 72 year old male with history of HIV, hepatitis C status posttreatment, stroke, prior alcohol and cocaine use, hypertension, CAD status post CABG, A-fib chronically anticoagulated, diabetes presenting with abdominal pain, hematemesis, melena.  GI bleed: 3-day history of hematemesis and melena in the setting of Eliquis.  CT with moderate fluid distention of the stomach and distal esophagus.  No evidence of small bowel obstruction.  He does have a significant stool load with rectal distention by formed stool.  He needs upper GI evaluation to rule out ulcers, gastric outlet obstruction, malignancy.  We will have to wait until Eliquis has had time to washout prior to procedure assuming he remains stable.  His hemoglobin has remained stable overnight.  Constipation: Patient denies.  CT imaging with large feces in the distal colon with marked rectal distention by formed stool.  Patient noted to have large amount of black stool today.  Has had some vague abdominal discomfort which could be related to constipation.  Currently with no abdominal pain.  Denies rectal pain.  Plan:   Continue pantoprazole infusion. Trend H&H. EGD after Eliquis washout. He had at least one dose of Eliquis yesterday, possibly two.  Possible EGD tomorrow.   LOS: 1 day   We would like to thank you for the opportunity to participate in the care of [name].  Leanna Battles. Dixon [facility]os [facility] [phone] 5/23/202410:43 AM

## 2022-07-13 NOTE — Progress Notes (Signed)
ASSUMPTION OF CARE NOTE   07/13/2022 1:54 PM  Anthony Ferguson was seen and examined.  The H&P by the admitting provider, orders, imaging was reviewed.  Please see new orders.  Will continue to follow.   Admission provider HPI:  72 y.o. male with medical history significant of HIV, hyperlipidemia, hypertension, type 2 diabetes mellitus, GERD, and more presents the ED with a chief complaint of abdominal pain and nausea.  Patient reports she has had abdominal pain since Tuesday.  He cannot describe if it is a burning, cramping, pressure, sharp.  He reports its "just a pain."  Reports his last bowel movement was Monday but it was a small 1.  On Sunday night he had melanotic stool per his report.  Patient reports an unknown number of episodes of coffee-ground emesis.  1 was witnessed in the ED and there is a picture of it in the media section of chart review.  Output from the NG tube appears to be bloody as well.  Patient reports nausea has improved with NG tube.  He denies any lightheadedness, chest pain, shortness of breath.  He denies any fever.  Patient reports he is never had a GI bleed before.  He reports a history of alcohol use, but cannot quantify it.  He reports no known history of liver disease.  He denies regular use of NSAIDs.  Patient has no other complaints at this time.    Vitals:   07/13/22 0926 07/13/22 1338  BP: 121/84 131/82  Pulse: 95 90  Resp: 20 18  Temp: 99 F (37.2 C) 98.5 F (36.9 C)  SpO2: 100% 98%    Results for orders placed or performed during the hospital encounter of 07/12/22  CBC with Differential  Result Value Ref Range   WBC 5.2 4.0 - 10.5 K/uL   RBC 3.44 (L) 4.22 - 5.81 MIL/uL   Hemoglobin 10.5 (L) 13.0 - 17.0 g/dL   HCT 40.9 (L) 81.1 - 91.4 %   MCV 91.9 80.0 - 100.0 fL   MCH 30.5 26.0 - 34.0 pg   MCHC 33.2 30.0 - 36.0 g/dL   RDW 78.2 95.6 - 21.3 %   Platelets 159 150 - 400 K/uL   nRBC 0.0 0.0 - 0.2 %   Neutrophils Relative % 81 %   Neutro Abs 4.2 1.7  - 7.7 K/uL   Lymphocytes Relative 13 %   Lymphs Abs 0.7 0.7 - 4.0 K/uL   Monocytes Relative 5 %   Monocytes Absolute 0.2 0.1 - 1.0 K/uL   Eosinophils Relative 1 %   Eosinophils Absolute 0.0 0.0 - 0.5 K/uL   Basophils Relative 0 %   Basophils Absolute 0.0 0.0 - 0.1 K/uL   Immature Granulocytes 0 %   Abs Immature Granulocytes 0.00 0.00 - 0.07 K/uL  Comprehensive metabolic panel  Result Value Ref Range   Sodium 136 135 - 145 mmol/L   Potassium 3.3 (L) 3.5 - 5.1 mmol/L   Chloride 102 98 - 111 mmol/L   CO2 24 22 - 32 mmol/L   Glucose, Bld 196 (H) 70 - 99 mg/dL   BUN 17 8 - 23 mg/dL   Creatinine, Ser 0.86 0.61 - 1.24 mg/dL   Calcium 9.5 8.9 - 57.8 mg/dL   Total Protein 8.2 (H) 6.5 - 8.1 g/dL   Albumin 4.3 3.5 - 5.0 g/dL   AST 21 15 - 41 U/L   ALT 13 0 - 44 U/L   Alkaline Phosphatase 59 38 - 126 U/L  Total Bilirubin 0.9 0.3 - 1.2 mg/dL   GFR, Estimated >16 >10 mL/min   Anion gap 10 5 - 15  Lipase, blood  Result Value Ref Range   Lipase 23 11 - 51 U/L  Urinalysis, Routine w reflex microscopic -Urine, Clean Catch  Result Value Ref Range   Color, Urine YELLOW YELLOW   APPearance CLEAR CLEAR   Specific Gravity, Urine 1.019 1.005 - 1.030   pH 5.0 5.0 - 8.0   Glucose, UA NEGATIVE NEGATIVE mg/dL   Hgb urine dipstick NEGATIVE NEGATIVE   Bilirubin Urine NEGATIVE NEGATIVE   Ketones, ur NEGATIVE NEGATIVE mg/dL   Protein, ur 30 (A) NEGATIVE mg/dL   Nitrite NEGATIVE NEGATIVE   Leukocytes,Ua NEGATIVE NEGATIVE   RBC / HPF 0-5 0 - 5 RBC/hpf   WBC, UA 0-5 0 - 5 WBC/hpf   Bacteria, UA NONE SEEN NONE SEEN   Squamous Epithelial / HPF 0-5 0 - 5 /HPF   Mucus PRESENT   CBC  Result Value Ref Range   WBC 4.5 4.0 - 10.5 K/uL   RBC 3.11 (L) 4.22 - 5.81 MIL/uL   Hemoglobin 9.5 (L) 13.0 - 17.0 g/dL   HCT 96.0 (L) 45.4 - 09.8 %   MCV 92.3 80.0 - 100.0 fL   MCH 30.5 26.0 - 34.0 pg   MCHC 33.1 30.0 - 36.0 g/dL   RDW 11.9 14.7 - 82.9 %   Platelets 138 (L) 150 - 400 K/uL   nRBC 0.0 0.0 - 0.2 %   Hemoglobin and hematocrit, blood  Result Value Ref Range   Hemoglobin 9.1 (L) 13.0 - 17.0 g/dL   HCT 56.2 (L) 13.0 - 86.5 %  Comprehensive metabolic panel  Result Value Ref Range   Sodium 137 135 - 145 mmol/L   Potassium 3.3 (L) 3.5 - 5.1 mmol/L   Chloride 105 98 - 111 mmol/L   CO2 23 22 - 32 mmol/L   Glucose, Bld 170 (H) 70 - 99 mg/dL   BUN 21 8 - 23 mg/dL   Creatinine, Ser 7.84 0.61 - 1.24 mg/dL   Calcium 8.9 8.9 - 69.6 mg/dL   Total Protein 7.3 6.5 - 8.1 g/dL   Albumin 3.7 3.5 - 5.0 g/dL   AST 16 15 - 41 U/L   ALT 13 0 - 44 U/L   Alkaline Phosphatase 45 38 - 126 U/L   Total Bilirubin 1.0 0.3 - 1.2 mg/dL   GFR, Estimated >29 >52 mL/min   Anion gap 9 5 - 15  Magnesium  Result Value Ref Range   Magnesium 1.7 1.7 - 2.4 mg/dL  CBC with Differential/Platelet  Result Value Ref Range   WBC 4.4 4.0 - 10.5 K/uL   RBC 3.01 (L) 4.22 - 5.81 MIL/uL   Hemoglobin 9.0 (L) 13.0 - 17.0 g/dL   HCT 84.1 (L) 32.4 - 40.1 %   MCV 91.0 80.0 - 100.0 fL   MCH 29.9 26.0 - 34.0 pg   MCHC 32.8 30.0 - 36.0 g/dL   RDW 02.7 25.3 - 66.4 %   Platelets 134 (L) 150 - 400 K/uL   nRBC 0.0 0.0 - 0.2 %   Neutrophils Relative % 78 %   Neutro Abs 3.5 1.7 - 7.7 K/uL   Lymphocytes Relative 13 %   Lymphs Abs 0.6 (L) 0.7 - 4.0 K/uL   Monocytes Relative 8 %   Monocytes Absolute 0.3 0.1 - 1.0 K/uL   Eosinophils Relative 0 %   Eosinophils Absolute 0.0 0.0 - 0.5 K/uL  Basophils Relative 0 %   Basophils Absolute 0.0 0.0 - 0.1 K/uL   Immature Granulocytes 1 %   Abs Immature Granulocytes 0.02 0.00 - 0.07 K/uL  Glucose, capillary  Result Value Ref Range   Glucose-Capillary 152 (H) 70 - 99 mg/dL  Glucose, capillary  Result Value Ref Range   Glucose-Capillary 119 (H) 70 - 99 mg/dL  Glucose, capillary  Result Value Ref Range   Glucose-Capillary 95 70 - 99 mg/dL  Type and screen  Result Value Ref Range   ABO/RH(D) A POS    Antibody Screen NEG    Sample Expiration      07/15/2022,2359 Performed at [facility], [address], [place], [place] [address]      C. Laural Benes, MD [facility]   07/12/2022  3:20 PM How to contact the [facility] Attending or Consulting provider 7A - 7P or covering provider during after hours 7P -7A, for this patient?  Check the care team in [facility] and look for a) attending/consulting [facility] provider listed and b) the [facility] team listed Log into [email or web address] and use [place]'s universal password to access. If you do not have the password, please contact the hospital operator. Locate the [facility] provider you are looking for under [facility] and page to a number that you can be directly reached. If you still have difficulty reaching the provider, please page the [facility] (Director on Call) for the Hospitalists listed on amion for assistance.

## 2022-07-13 NOTE — Assessment & Plan Note (Signed)
Continue Biktarvy. 

## 2022-07-13 NOTE — H&P (Signed)
History and Physical    Patient: Anthony Ferguson ZOX:[id number] DOB: November 09, 1950 DOA: 07/12/2022 DOS: the patient was seen and examined on 07/13/2022 PCP: Patient, No Pcp Per  Patient coming from: [facility]  Chief Complaint:  Chief Complaint  Patient presents with   Abdominal Pain   Emesis   HPI: CHRISTAIN Ferguson is a 72 y.o. male with medical history significant of HIV, hyperlipidemia, hypertension, type 2 diabetes mellitus, GERD, and more presents the ED with a chief complaint of abdominal pain and nausea.  Patient reports she has had abdominal pain since Tuesday.  He cannot describe if it is a burning, cramping, pressure, sharp.  He reports its "just a pain."  Reports his last bowel movement was Monday but it was a small 1.  On Sunday night he had melanotic stool per his report.  Patient reports an unknown number of episodes of coffee-ground emesis.  1 was witnessed in the ED and there is a picture of it in the media section of chart review.  Output from the NG tube appears to be bloody as well.  Patient reports nausea has improved with NG tube.  He denies any lightheadedness, chest pain, shortness of breath.  He denies any fever.  Patient reports he is never had a GI bleed before.  He reports a history of alcohol use, but cannot quantify it.  He reports no known history of liver disease.  He denies regular use of NSAIDs.  Patient has no other complaints at this time.  Patient does not smoke, does not drink any longer, and does not use illicit drugs.  He is not vaccinated for COVID or flu.  Patient is full code. Review of Systems: As mentioned in the history of present illness. All other systems reviewed and are negative. Past Medical History:  Diagnosis Date   HIV (human immunodeficiency virus infection) (HCC)    Hypercholesteremia    Hypertension    Type 2 diabetes mellitus (HCC)    Past Surgical History:  Procedure Laterality Date   CARDIAC SURGERY     CIRCUMCISION      CORONARY ARTERY BYPASS GRAFT     IR CT HEAD LTD  05/20/2021   IR PERCUTANEOUS ART THROMBECTOMY/INFUSION INTRACRANIAL INC DIAG ANGIO  05/20/2021   RADIOLOGY WITH ANESTHESIA N/A 05/19/2021   Procedure: IR WITH ANESTHESIA;  Surgeon: Julieanne Cotton, MD;  Location: [facility] OR;  Service: Radiology;  Laterality: N/A;   Social History:  reports that he has been smoking cigarettes. He has been smoking an average of .3 packs per day. He has never used smokeless tobacco. He reports current alcohol use of about 5.0 standard drinks of alcohol per week. He reports that he does not currently use drugs after having used the following drugs: Cocaine.  Allergies  Allergen Reactions   Hctz [Hydrochlorothiazide] Other (See Comments)    "ran my sugar up"   Pork-Derived Products Palpitations and Hypertension    Family History  Problem Relation Age of Onset   Diabetes Mother    Heart failure Mother     Prior to Admission medications   Medication Sig Start Date End Date Taking? Authorizing Provider  amLODipine (NORVASC) 10 MG tablet Take 1 tablet (10 mg total) by mouth daily. 05/26/21   Rinehuls, Kinnie Scales, PA-C  apixaban (ELIQUIS) 5 MG TABS tablet Take 1 tablet (5 mg total) by mouth 2 (two) times daily. 07/24/16   Langston Reusing, MD  baclofen (LIORESAL) 10 MG tablet Take by mouth. Patient  not taking: Reported on 01/25/2022 06/13/21   [provider]  bictegravir-emtricitabine-tenofovir AF (BIKTARVY) 50-200-25 MG TABS tablet Take 1 tablet by mouth daily. 01/25/22   Cliffton Asters, MD  chlorhexidine (PERIDEX) 0.12 % solution 15 mLs by Mouth Rinse route 2 (two) times daily. 05/25/21   Rinehuls, Kinnie Scales, PA-C  diltiazem (CARDIZEM) 30 MG tablet Take 1 tablet (30 mg total) by mouth every 8 (eight) hours. 05/25/21   Rinehuls, Kinnie Scales, PA-C  feeding supplement, GLUCERNA SHAKE, (GLUCERNA SHAKE) LIQD Take 237 mLs by mouth 3 (three) times daily between meals. 05/25/21   Rinehuls, Kinnie Scales, PA-C  folic acid (FOLVITE) 1 MG  tablet Take 1 tablet (1 mg total) by mouth daily. 05/26/21   Rinehuls, Kinnie Scales, PA-C  gabapentin (NEURONTIN) 300 MG capsule Take 300 mg by mouth 3 (three) times daily. 01/19/22   [provider]  glipiZIDE (GLUCOTROL) 10 MG tablet Take 10 mg by mouth daily. 12/30/21   [provider]  Glucagon, rDNA, (GLUCAGON EMERGENCY) 1 MG KIT Inject into the muscle. 07/09/21   [provider]  glucose blood test strip Use as instructed 03/14/17   Cliffton Asters, MD  hydrALAZINE (APRESOLINE) 50 MG tablet Take 1 tablet (50 mg total) by mouth every 8 (eight) hours. 05/25/21   Rinehuls, David L, PA-C  lisinopril (ZESTRIL) 10 MG tablet Take 1 tablet (10 mg total) by mouth daily. Patient not taking: Reported on 01/25/2022 05/26/21   Rinehuls, Kinnie Scales, PA-C  Multiple Vitamin (MULTIVITAMIN WITH MINERALS) TABS tablet Take 1 tablet by mouth daily. Patient not taking: Reported on 01/25/2022 05/26/21   Rinehuls, Kinnie Scales, PA-C  NOVOLOG FLEXPEN 100 UNIT/ML FlexPen Inject into the skin. 11/22/21   [provider]  omega-3 acid ethyl esters (LOVAZA) 1 g capsule Take 1 capsule by mouth 2 (two) times daily. 06/19/16   [provider]  oxyCODONE (OXY IR/ROXICODONE) 5 MG immediate release tablet Take 5 mg by mouth 2 (two) times daily. Patient not taking: Reported on 01/25/2022 06/15/21   [provider]  pantoprazole sodium (PROTONIX) 40 mg Take 40 mg by mouth at bedtime. Patient not taking: Reported on 01/25/2022 05/25/21   Rinehuls, Kinnie Scales, PA-C  polyethylene glycol (MIRALAX) 17 g packet Take 17 g by mouth daily. 10/11/21   Gloris Manchester, MD  rosuvastatin (CRESTOR) 5 MG tablet Take by mouth. 06/13/21   [provider]  senna-docusate (SENOKOT-S) 8.6-50 MG tablet Place 1 tablet into feeding tube at bedtime as needed for moderate constipation or mild constipation. 05/25/21   Rinehuls, Kinnie Scales, PA-C  thiamine 100 MG tablet Take 1 tablet (100 mg total) by mouth daily. 05/26/21   Rinehuls, Kinnie Scales, PA-C  traMADol (ULTRAM) 50 MG tablet Take 50 mg by mouth 2 (two) times daily as needed. 01/14/22   [provider]    Physical Exam: Vitals:   07/12/22 2200 07/12/22 2300 07/13/22 0006 07/13/22 0456  BP: (!) 147/83 (!) 141/89 (!) 159/88 125/87  Pulse: 93 100 100 (!) 103  Resp: 18 15 18 18   Temp:   98.2 F (36.8 C) 98.1 F (36.7 C)  TempSrc:    Oral  SpO2: 98% 94% 97% 97%  Weight:   68.3 kg   Height:       1.  General: Patient lying supine in bed,  no acute distress   2. Psychiatric: Alert and oriented x 3, mood and behavior normal for situation, pleasant and cooperative with exam   3. Neurologic: Speech and language  are normal, face is symmetric, moves all 4 extremities voluntarily, at baseline without acute deficits on limited exam   4. HEENMT:  Head is atraumatic, normocephalic, pupils reactive to light, neck is supple, trachea is midline, mucous membranes are moist   5. Respiratory : Lungs are clear to auscultation bilaterally without wheezing, rhonchi, rales, no cyanosis, no increase in work of breathing or accessory muscle use   6. Cardiovascular : Heart rate slightly tachycardic, rhythm is regular, no murmurs, rubs or gallops, no peripheral edema, peripheral pulses palpated   7. Gastrointestinal:  Abdomen is soft, minimally distended, tender to palpation in the epigastric region, bowel sounds active, no masses or organomegaly palpated   8. Skin:  Skin is warm, dry and intact without rashes, acute lesions, or ulcers on limited exam   9.Musculoskeletal:  No acute deformities or trauma, no asymmetry in tone, no peripheral edema, peripheral pulses palpated, no tenderness to palpation in the extremities  Data Reviewed: In the ED Afebrile, slightly tachycardic with a heart rate of 93-105, respiratory rate 17-29, blood pressure 123/74-147/96, satting 94-99% No leukocytosis with a white blood cell count of 5.2, hemoglobin 10.5, platelets 159 Slightly  hypokalemic at 3.3-likely due to GI losses Hyperglycemia at 196 UA is not indicative of UTI CT abdomen pelvis shows moderate fluid distention of the stomach with fluid distended distal esophagus Coffee-ground emesis witnessed in the ED 1 L LR given in the ED as well as morphine, Zofran, Protonix Admission requested for GI bleed  Assessment and Plan: * GI bleed - Coffee-ground emesis - Reported melena - Nausea has improved since NG tube was placed - CT abdomen and pelvis showed moderate fluid distention of the stomach with fluid distended distal esophagus recommending NG tube and signs of chronic constipation - GI consulted and plans to see patient in the a.m. - N.p.o. - PPI twice daily - Hemoglobin down to 9.0 from 10.5 - Continue to monitor every 6 hours - Denies NSAID use - Reports history of alcohol use - No known liver disease - No NSAID use reported - Appreciate recommendations from GI  Hyperlipidemia - Continue statin  GERD (gastroesophageal reflux disease) - Continue PPI twice daily  Essential hypertension - Continue Cardizem, Norvasc, hydralazine  Type 2 diabetes mellitus (HCC) - Sliding scale coverage - Glucose 196 in the ED - Last A1c was March 2023 and was 7.9 - Continue to monitor  Human immunodeficiency virus (HIV) disease (HCC) - Continue Biktarvy      Advance Care Planning:   Code Status: Full Code  Consults: GI  Family Communication: No family at bedside  Severity of Illness: The appropriate patient status for this patient is INPATIENT. Inpatient status is judged to be reasonable and necessary in order to provide the required intensity of service to ensure the patient's safety. The patient's presenting symptoms, physical exam findings, and initial radiographic and laboratory data in the context of their chronic comorbidities is felt to place them at high risk for further clinical deterioration. Furthermore, it is not anticipated that the patient  will be medically stable for discharge from the hospital within 2 midnights of admission.   * I certify that at the point of admission it is my clinical judgment that the patient will require inpatient hospital care spanning beyond 2 midnights from the point of admission due to high intensity of service, high risk for further deterioration and high frequency of surveillance required.*  Author: Lilyan Gilford, DO 07/13/2022 5:19 AM  For on call review  [email or web address]

## 2022-07-13 NOTE — Assessment & Plan Note (Addendum)
IV replacement ordered. 

## 2022-07-13 NOTE — Progress Notes (Signed)
63F NGT inserted into right nare by Gwenlyn Fudge. Patient tolerated fair.

## 2022-07-13 NOTE — Progress Notes (Signed)
CBG 152 on [facility] 3A #3

## 2022-07-13 NOTE — H&P (View-Only) (Signed)
  Gastroenterology Consult   Referring Provider: No ref. provider found Primary Care Physician:  Patient, No Pcp Per Primary Gastroenterologist: Unassigned  Patient ID: Anthony Ferguson; [id number]; 05/20/1950   Admit date: 07/12/2022  LOS: 1 day   Date of Consultation: 07/13/2022  Reason for Consultation: GI bleed    History of Present Illness   Anthony Ferguson is a 71 y.o. male with history of HIV (followed by Dr. Campbell with infectious disease), hepatitis C treated with Epclusa with documented SVR 2019, stroke, prior cocaine abuse, prior history of alcohol use but patient cannot quantify, hypertension, diabetes, CAD status post CABG, Afib chronically anticoagulated with Eliquis presenting with abdominal pain, nausea/vomiting, melena.  Patient reports abdominal pain for 2 days.  Reported melena Sunday night.  Several episodes of coffee-ground emesis, one witnessed in the emergency department, picture noted in chart.  NG tube placed, small amt of dark black contents noted.  Patient denied any prior GI workup. He denies constipation, diarrhea, brbpr, dysphagia. Some heartburn controlled with medication.   In the ED yesterday his white blood cell count was 5200, hemoglobin 10.4 down from 11.29 months ago, platelets 159,000, potassium 3.3, BUN 17, creatinine 0.77, albumin 4.3, total bilirubin 0.9, alk phos 59, AST 21, ALT 13.  Today's hemoglobin is 9.0.  Platelets 134,000.  CT abdomen pelvis with contrast yesterday showed moderate fluid distention of the stomach with fluid distended distal esophagus, no dilated small bowel to suggest small bowel obstruction.  Moderate feces in the distal colon with large retained feces at the rectum, rectal distention up to 9.1 cm.   Prior to Admission medications   Medication Sig Start Date End Date Taking? Authorizing Provider  amLODipine (NORVASC) 10 MG tablet Take 1 tablet (10 mg total) by mouth daily. 05/26/21  Yes Rinehuls, David L, PA-C  apixaban  (ELIQUIS) 5 MG TABS tablet Take 1 tablet (5 mg total) by mouth 2 (two) times daily. 07/24/16  Yes Ukleja, Alexandria U, MD  bictegravir-emtricitabine-tenofovir AF (BIKTARVY) 50-200-25 MG TABS tablet Take 1 tablet by mouth daily. 01/25/22  Yes Campbell, John, MD  chlorhexidine (PERIDEX) 0.12 % solution 15 mLs by Mouth Rinse route 2 (two) times daily. 05/25/21  Yes Rinehuls, David L, PA-C  feeding supplement, GLUCERNA SHAKE, (GLUCERNA SHAKE) LIQD Take 237 mLs by mouth 3 (three) times daily between meals. 05/25/21  Yes Rinehuls, David L, PA-C  folic acid (FOLVITE) 1 MG tablet Take 1 tablet (1 mg total) by mouth daily. 05/26/21  Yes Rinehuls, David L, PA-C  gabapentin (NEURONTIN) 300 MG capsule Take 600 mg by mouth 3 (three) times daily. 01/19/22  Yes [provider]  glipiZIDE (GLUCOTROL) 10 MG tablet Take 10 mg by mouth daily. 12/30/21  Yes [provider]  Glucagon, rDNA, (GLUCAGON EMERGENCY) 1 MG KIT Inject into the muscle. 07/09/21  Yes [provider]  hydrALAZINE (APRESOLINE) 50 MG tablet Take 1 tablet (50 mg total) by mouth every 8 (eight) hours. 05/25/21  Yes Rinehuls, David L, PA-C  HYDROcodone-acetaminophen (NORCO/VICODIN) 5-325 MG tablet Take 1 tablet by mouth 4 (four) times daily.   Yes [provider]  Menthol, Topical Analgesic, (BIOFREEZE) 4 % GEL Apply 1 Application topically every 8 (eight) hours as needed (left leg pain).   Yes [provider]  omega-3 acid ethyl esters (LOVAZA) 1 g capsule Take 1 capsule by mouth 2 (two) times daily. 06/19/16  Yes [provider]  omeprazole (PRILOSEC) 20 MG capsule Take 20 mg by mouth daily.   Yes   [provider]  polyethylene glycol (MIRALAX) 17 g packet Take 17 g by mouth daily. 10/11/21  Yes Dixon, Ryan, MD  rosuvastatin (CRESTOR) 5 MG tablet Take by mouth. 06/13/21  Yes [provider]  senna-docusate (SENOKOT-S) 8.6-50 MG tablet Place 1 tablet into feeding tube at bedtime as needed for  moderate constipation or mild constipation. Patient taking differently: Place 1 tablet into feeding tube every 12 (twelve) hours as needed for moderate constipation or mild constipation. 05/25/21  Yes Rinehuls, David L, PA-C  thiamine 100 MG tablet Take 1 tablet (100 mg total) by mouth daily. 05/26/21  Yes Rinehuls, David L, PA-C  glucose blood test strip Use as instructed 03/14/17   Campbell, John, MD    Current Facility-Administered Medications  Medication Dose Route Frequency Provider Last Rate Last Admin   acetaminophen (TYLENOL) tablet 650 mg  650 mg Oral Q6H PRN Zierle-Ghosh, Asia B, DO       Or   acetaminophen (TYLENOL) suppository 650 mg  650 mg Rectal Q6H PRN Zierle-Ghosh, Asia B, DO       hydrALAZINE (APRESOLINE) injection 10 mg  10 mg Intravenous Q8H PRN Zierle-Ghosh, Asia B, DO       insulin aspart (novoLOG) injection 0-15 Units  0-15 Units Subcutaneous TID WC Zierle-Ghosh, Asia B, DO       insulin aspart (novoLOG) injection 0-5 Units  0-5 Units Subcutaneous QHS Zierle-Ghosh, Asia B, DO       magnesium sulfate IVPB 2 g 50 mL  2 g Intravenous Once Johnson, Clanford L, MD 50 mL/hr at 07/13/22 1032 2 g at 07/13/22 1032   morphine (PF) 2 MG/ML injection 2 mg  2 mg Intravenous Q2H PRN Zierle-Ghosh, Asia B, DO       ondansetron (ZOFRAN) tablet 4 mg  4 mg Oral Q6H PRN Zierle-Ghosh, Asia B, DO       Or   ondansetron (ZOFRAN) injection 4 mg  4 mg Intravenous Q6H PRN Zierle-Ghosh, Asia B, DO       pantoprozole (PROTONIX) 80 mg /NS 100 mL infusion  8 mg/hr Intravenous Continuous Nanavati, Ankit, MD 10 mL/hr at 07/13/22 0252 8 mg/hr at 07/13/22 0252    Allergies as of 07/12/2022 - Review Complete 07/12/2022  Allergen Reaction Noted   Hctz [hydrochlorothiazide] Other (See Comments) 11/02/2014   Pork-derived products Palpitations and Hypertension 07/23/2016    Past Medical History:  Diagnosis Date   HIV (human immunodeficiency virus infection) (HCC)    Hypercholesteremia    Hypertension     Type 2 diabetes mellitus (HCC)     Past Surgical History:  Procedure Laterality Date   CARDIAC SURGERY     CIRCUMCISION     CORONARY ARTERY BYPASS GRAFT     IR CT HEAD LTD  05/20/2021   IR PERCUTANEOUS ART THROMBECTOMY/INFUSION INTRACRANIAL INC DIAG ANGIO  05/20/2021   RADIOLOGY WITH ANESTHESIA N/A 05/19/2021   Procedure: IR WITH ANESTHESIA;  Surgeon: Deveshwar, Sanjeev, MD;  Location: [facility] OR;  Service: Radiology;  Laterality: N/A;    Family History  Problem Relation Age of Onset   Diabetes Mother    Heart failure Mother     Social History   Socioeconomic History   Marital status: Legally Separated    Spouse name: Not on file   Number of children: Not on file   Years of education: Not on file   Highest education level: Not on file  Occupational History   Not on file  Tobacco Use   Smoking status: Every   Day    Packs/day: .3    Types: Cigarettes   Smokeless tobacco: Never   Tobacco comments:    Trying to slow down  Vaping Use   Vaping Use: Never used  Substance and Sexual Activity   Alcohol use: Yes    Alcohol/week: 5.0 standard drinks of alcohol    Types: 5 Cans of beer per week    Comment: occas   Drug use: Not Currently    Types: Cocaine    Comment: last use 07/22/16   Sexual activity: Not Currently    Partners: Female    Comment: condoms declined  Other Topics Concern   Not on file  Social History Narrative   Not on file   Social Determinants of Health   Financial Resource Strain: Not on file  Food Insecurity: Not on file  Transportation Needs: Not on file  Physical Activity: Not on file  Stress: Not on file  Social Connections: Not on file  Intimate Partner Violence: Not on file     Review of System:   General: Negative for anorexia, weight loss, fever, chills, fatigue, +weakness. Has not slept well in two nights.  Eyes: Negative for vision changes.  ENT: Negative for hoarseness, difficulty swallowing , nasal congestion. CV: Negative for chest pain,  angina, palpitations, dyspnea on exertion, peripheral edema.  Respiratory: Negative for dyspnea at rest, dyspnea on exertion, cough, sputum, wheezing.  GI: See history of present illness. GU:  Negative for dysuria, hematuria, urinary incontinence, urinary frequency, nocturnal urination.  MS: Negative for joint pain, low back pain.  Derm: Negative for rash or itching.  Neuro: Negative for seizure, frequent headaches, memory loss, confusion. Right sided weakness since stroke.  Psych: Negative for anxiety, depression, suicidal ideation, hallucinations.  Endo: Negative for unusual weight change.  Heme: Negative for bruising or bleeding. Allergy: Negative for rash or hives.      Physical Examination:   Vital signs in last 24 hours: Temp:  [98.1 F (36.7 C)-99 F (37.2 C)] 99 F (37.2 C) (05/23 0926) Pulse Rate:  [93-105] 95 (05/23 0926) Resp:  [15-29] 20 (05/23 0926) BP: (121-159)/(72-96) 121/84 (05/23 0926) SpO2:  [94 %-100 %] 100 % (05/23 0926) Weight:  [68 kg-68.3 kg] 68.3 kg (05/23 0006) Last BM Date : 07/12/22  General: Well-nourished, well-developed in no acute distress. NGT in place with scant black contents in the cannister Head: Normocephalic, atraumatic.   Eyes: Conjunctiva pink, no icterus. Mouth: Oropharyngeal mucosa moist and pink   Neck: Supple without thyromegaly, masses, or lymphadenopathy.  Lungs: Clear to auscultation bilaterally.  Heart: Regular rate and rhythm, no murmurs rubs or gallops.  Abdomen: Bowel sounds are normal, nontender, nondistended, no hepatosplenomegaly or masses, no abdominal bruits or hernia , no rebound or guarding.   Rectal: not performed Extremities: No lower extremity edema, clubbing, deformity.  Neuro: Alert and oriented x 4 , grossly normal neurologically.  Skin: Warm and dry, no rash or jaundice.   Psych: Alert and cooperative, normal mood and affect.        Intake/Output from previous day: 05/22 0701 - 05/23 0700 In: 1000 [IV  Piggyback:1000] Out: 250 [Urine:250] Intake/Output this shift: No intake/output data recorded.  Lab Results:   CBC Recent Labs    07/12/22 1615 07/12/22 2307 07/13/22 0418  WBC 5.2 4.5 4.4  HGB 10.5* 9.5* 9.0*  HCT 31.6* 28.7* 27.4*  MCV 91.9 92.3 91.0  PLT 159 138* 134*   BMET Recent Labs    07/12/22 1615 07/13/22 0418    NA 136 137  K 3.3* 3.3*  CL 102 105  CO2 24 23  GLUCOSE 196* 170*  BUN 17 21  CREATININE 0.77 0.62  CALCIUM 9.5 8.9   LFT Recent Labs    07/12/22 1615 07/13/22 0418  BILITOT 0.9 1.0  ALKPHOS 59 45  AST 21 16  ALT 13 13  PROT 8.2* 7.3  ALBUMIN 4.3 3.7    Lipase Recent Labs    07/12/22 1615  LIPASE 23    PT/INR No results for input(s): "LABPROT", "INR" in the last 72 hours.   Hepatitis Panel No results for input(s): "HEPBSAG", "HCVAB", "HEPAIGM", "HEPBIGM" in the last 72 hours.   Imaging Studies:   DG Chest Port 1 View  Result Date: 07/13/2022 CLINICAL DATA:  NG placement. EXAM: PORTABLE CHEST 1 VIEW COMPARISON:  Chest radiograph dated 10/10/2021. FINDINGS: Enteric tube with tip in the distal esophagus and side port in the mid esophagus. Recommend further advancing by additional 15 cm. Minimal left lung base atelectasis. No focal consolidation, pleural effusion, or pneumothorax. Mild cardiomegaly. Atherosclerotic calcification of the aorta. Median sternotomy wires and CABG vascular clips. No acute osseous pathology. IMPRESSION: Enteric tube with tip in the distal esophagus. Recommend further advancing by additional 15 cm. Electronically Signed   By: Arash  Radparvar M.D.   On: 07/13/2022 01:22   CT ABDOMEN PELVIS W CONTRAST  Result Date: 07/12/2022 CLINICAL DATA:  Low abdomen pain EXAM: CT ABDOMEN AND PELVIS WITH CONTRAST TECHNIQUE: Multidetector CT imaging of the abdomen and pelvis was performed using the standard protocol following bolus administration of intravenous contrast. RADIATION DOSE REDUCTION: This exam was performed  according to the departmental dose-optimization program which includes automated exposure control, adjustment of the mA and/or kV according to patient size and/or use of iterative reconstruction technique. CONTRAST:  80mL OMNIPAQUE IOHEXOL 300 MG/ML  SOLN COMPARISON:  Radiograph 07/20/2021 FINDINGS: Lower chest: Lung bases demonstrate no acute airspace disease. Coronary vascular calcification. Fluid-filled distal esophageal hernia. Hepatobiliary: Punctate calcification at the gallbladder fundus. No biliary dilatation. Pancreas: Unremarkable. No pancreatic ductal dilatation or surrounding inflammatory changes. Spleen: Normal in size without focal abnormality. Adrenals/Urinary Tract: Adrenal glands are unremarkable. Kidneys are normal, without renal calculi, focal lesion, or hydronephrosis. Bladder is unremarkable. Stomach/Bowel: Moderate fluid distension of the stomach. No dilated small bowel to suggest small bowel obstruction. Fluid within the colon. Generous sized appendix but with air throughout. Moderate feces in the distal colon with large retained feces at the rectum, rectal distension up to 9.1 cm by formed feces. Mild perirectal thickening without edema. Vascular/Lymphatic: Moderate aortic atherosclerosis. No aneurysm. No suspicious lymph nodes Reproductive: Prostate is unremarkable. Other: Negative for pelvic effusion or free air Musculoskeletal: No acute or suspicious osseous abnormality IMPRESSION: 1. Moderate fluid distension of the stomach with fluid distended distal esophagus. NG decompression as indicated. 2. No evidence for dilated small bowel to suggest bowel obstruction. Fluid within the colon. Large feces in the distal colon with marked rectal distension by formed stool with mild perirectal wall thickening. 3. Punctate calcification at the gallbladder fundus, possible small stone. 4. Aortic atherosclerosis. Aortic Atherosclerosis (ICD10-I70.0). Electronically Signed   By: Kim  Fujinaga M.D.   On:  07/12/2022 19:54  [4 week]  Assessment:   Pleasant 71-year-old male with history of HIV, hepatitis C status posttreatment, stroke, prior alcohol and cocaine use, hypertension, CAD status post CABG, A-fib chronically anticoagulated, diabetes presenting with abdominal pain, hematemesis, melena.  GI bleed: 3-day history of hematemesis and melena in the setting of Eliquis.    CT with moderate fluid distention of the stomach and distal esophagus.  No evidence of small bowel obstruction.  He does have a significant stool load with rectal distention by formed stool.  He needs upper GI evaluation to rule out ulcers, gastric outlet obstruction, malignancy.  We will have to wait until Eliquis has had time to washout prior to procedure assuming he remains stable.  His hemoglobin has remained stable overnight.  Constipation: Patient denies.  CT imaging with large feces in the distal colon with marked rectal distention by formed stool.  Patient noted to have large amount of black stool today.  Has had some vague abdominal discomfort which could be related to constipation.  Currently with no abdominal pain.  Denies rectal pain.  Plan:   Continue pantoprazole infusion. Trend H&H. EGD after Eliquis washout. He had at least one dose of Eliquis yesterday, possibly two.  Possible EGD tomorrow.   LOS: 1 day   We would like to thank you for the opportunity to participate in the care of Eliah J Ploeger.  Naomee Nowland S. Lezlee Gills, PA-C [facility] [phone] 5/23/202410:43 AM   

## 2022-07-13 NOTE — Assessment & Plan Note (Addendum)
-   holding Cardizem, Norvasc, hydralazine due to soft BPs in setting of GI bleed

## 2022-07-13 NOTE — Assessment & Plan Note (Signed)
Continue statin. 

## 2022-07-13 NOTE — Assessment & Plan Note (Addendum)
-   Continue IV pantoprazole infusion per GI

## 2022-07-13 NOTE — Assessment & Plan Note (Addendum)
-   Coffee-ground emesis - melena seen in hospital - Nausea has improved since NG tube was placed - CT abdomen and pelvis showed moderate fluid distention of the stomach with fluid distended distal esophagus recommending NG tube and signs of chronic constipation - GI consulted and plans EGD 5/24. - N.p.o. for now - IV pantoprazole infusion - Hemoglobin down to 9.0 from 10.5 - Continue to monitor every 6 hours - Denies NSAID use - Reports history of alcohol use - No known liver disease - No NSAID use reported - Appreciate recommendations from GI

## 2022-07-13 NOTE — Progress Notes (Signed)
Transition of Care [facility]) - Inpatient Brief Assessment   Patient Details  Name: Anthony Ferguson MRN: [id number] Date of Birth: Jan 23, 1951  Transition of [facility]) CM/SW Contact:    Leitha Bleak, RN Phone Number: 07/13/2022, 2:10 PM   Clinical Narrative:  Patient is from [facility], DC plan 2-3 days.  Transition of Care Asessment: Insurance and Status: (P) Insurance coverage has been reviewed Patient has primary care physician: (P) No (from [facility]) Home environment has been reviewed: (P) SNF Prior level of function:: (P) needs assistance   Social Determinants of Health Reivew: (P) SDOH reviewed no interventions necessary Readmission risk has been reviewed: (P) Yes Transition of care needs: (P) transition of care needs identified, TOC will continue to follow

## 2022-07-13 NOTE — Assessment & Plan Note (Addendum)
-   Sliding scale coverage - CBG (last 3)  Recent Labs    07/13/22 1110 07/13/22 2136 07/14/22 0724  GLUCAP 95 100* 80

## 2022-07-13 NOTE — Progress Notes (Addendum)
NGT tube advanced 15 cm. Connected to low intermittent suction.  Small amount of dark/ black contents collected in suction container.

## 2022-07-14 ENCOUNTER — Encounter (HOSPITAL_COMMUNITY): Payer: Self-pay | Admitting: Family Medicine

## 2022-07-14 ENCOUNTER — Inpatient Hospital Stay (HOSPITAL_COMMUNITY): Payer: Medicare (Managed Care) | Admitting: Anesthesiology

## 2022-07-14 ENCOUNTER — Encounter (HOSPITAL_COMMUNITY)
Admission: EM | Disposition: A | Discharge status: Skilled Nursing Facility | Payer: Self-pay | Source: Skilled Nursing Facility | Attending: Family Medicine

## 2022-07-14 DIAGNOSIS — D62 Acute posthemorrhagic anemia: Secondary | ICD-10-CM | POA: Diagnosis not present

## 2022-07-14 DIAGNOSIS — K2970 Gastritis, unspecified, without bleeding: Secondary | ICD-10-CM

## 2022-07-14 DIAGNOSIS — K920 Hematemesis: Secondary | ICD-10-CM | POA: Diagnosis not present

## 2022-07-14 DIAGNOSIS — K219 Gastro-esophageal reflux disease without esophagitis: Secondary | ICD-10-CM | POA: Diagnosis not present

## 2022-07-14 DIAGNOSIS — E1165 Type 2 diabetes mellitus with hyperglycemia: Secondary | ICD-10-CM | POA: Diagnosis not present

## 2022-07-14 DIAGNOSIS — B20 Human immunodeficiency virus [HIV] disease: Secondary | ICD-10-CM | POA: Diagnosis not present

## 2022-07-14 DIAGNOSIS — T18198A Other foreign object in esophagus causing other injury, initial encounter: Secondary | ICD-10-CM

## 2022-07-14 DIAGNOSIS — K922 Gastrointestinal hemorrhage, unspecified: Secondary | ICD-10-CM | POA: Diagnosis not present

## 2022-07-14 DIAGNOSIS — F17210 Nicotine dependence, cigarettes, uncomplicated: Secondary | ICD-10-CM

## 2022-07-14 DIAGNOSIS — K2931 Chronic superficial gastritis with bleeding: Secondary | ICD-10-CM | POA: Insufficient documentation

## 2022-07-14 DIAGNOSIS — I10 Essential (primary) hypertension: Secondary | ICD-10-CM

## 2022-07-14 DIAGNOSIS — I2510 Atherosclerotic heart disease of native coronary artery without angina pectoris: Secondary | ICD-10-CM

## 2022-07-14 HISTORY — PX: ESOPHAGOGASTRODUODENOSCOPY (EGD) WITH PROPOFOL: SHX5813

## 2022-07-14 HISTORY — PX: BIOPSY: SHX5522

## 2022-07-14 LAB — CBC
HCT: 26.5 % — ABNORMAL LOW (ref 39.0–52.0)
Hemoglobin: 8.8 g/dL — ABNORMAL LOW (ref 13.0–17.0)
MCH: 30.6 pg (ref 26.0–34.0)
MCHC: 33.2 g/dL (ref 30.0–36.0)
MCV: 92 fL (ref 80.0–100.0)
Platelets: 125 10*3/uL — ABNORMAL LOW (ref 150–400)
RBC: 2.88 MIL/uL — ABNORMAL LOW (ref 4.22–5.81)
RDW: 14.6 % (ref 11.5–15.5)
WBC: 4.6 10*3/uL (ref 4.0–10.5)
nRBC: 0 % (ref 0.0–0.2)

## 2022-07-14 LAB — GLUCOSE, CAPILLARY
Glucose-Capillary: 80 mg/dL (ref 70–99)
Glucose-Capillary: 91 mg/dL (ref 70–99)
Glucose-Capillary: 85 mg/dL (ref 70–99)
Glucose-Capillary: 85 mg/dL (ref 70–99)
Glucose-Capillary: 143 mg/dL — ABNORMAL HIGH (ref 70–99)

## 2022-07-14 LAB — BASIC METABOLIC PANEL
Anion gap: 7 (ref 5–15)
BUN: 13 mg/dL (ref 8–23)
CO2: 23 mmol/L (ref 22–32)
Calcium: 8.5 mg/dL — ABNORMAL LOW (ref 8.9–10.3)
Chloride: 108 mmol/L (ref 98–111)
Creatinine, Ser: 0.69 mg/dL (ref 0.61–1.24)
GFR, Estimated: >60 mL/min (ref >60)
Glucose, Bld: 92 mg/dL (ref 70–99)
Potassium: 3.2 mmol/L — ABNORMAL LOW (ref 3.5–5.1)
Sodium: 138 mmol/L (ref 135–145)

## 2022-07-14 LAB — HEMOGLOBIN A1C
Hgb A1c MFr Bld: 6.2 % — ABNORMAL HIGH (ref 4.8–5.6)
Mean Plasma Glucose: 131 mg/dL

## 2022-07-14 LAB — MAGNESIUM: Magnesium: 1.8 mg/dL (ref 1.7–2.4)

## 2022-07-14 SURGERY — ESOPHAGOGASTRODUODENOSCOPY (EGD) WITH PROPOFOL
Anesthesia: General

## 2022-07-14 MED ORDER — SUCCINYLCHOLINE CHLORIDE 200 MG/10ML IV SOSY
PREFILLED_SYRINGE | INTRAVENOUS | Status: DC | PRN
  Administered 2022-07-14: 100 mg via INTRAVENOUS

## 2022-07-14 MED ORDER — MORPHINE SULFATE (PF) 2 MG/ML IV SOLN: 1.0000 mg | INTRAVENOUS | Status: DC | PRN

## 2022-07-14 MED ORDER — SODIUM CHLORIDE 0.9 % IV SOLN: INTRAVENOUS | Status: DC

## 2022-07-14 MED ORDER — PROPOFOL 10 MG/ML IV BOLUS
INTRAVENOUS | Status: DC | PRN
  Administered 2022-07-14: 100 mg via INTRAVENOUS

## 2022-07-14 MED ORDER — LIDOCAINE HCL (CARDIAC) PF 100 MG/5ML IV SOSY
PREFILLED_SYRINGE | INTRAVENOUS | Status: DC | PRN
  Administered 2022-07-14: 60 mg via INTRATRACHEAL

## 2022-07-14 MED ORDER — METOCLOPRAMIDE HCL 5 MG/ML IJ SOLN
INTRAMUSCULAR | Status: AC
  Filled 2022-07-14: qty 2

## 2022-07-14 MED ORDER — METOCLOPRAMIDE HCL 5 MG/ML IJ SOLN
10.0000 mg | Freq: Once | INTRAMUSCULAR | Status: AC
  Administered 2022-07-14: 10 mg via INTRAVENOUS

## 2022-07-14 MED ORDER — ONDANSETRON HCL 4 MG/2ML IJ SOLN
INTRAMUSCULAR | Status: DC | PRN
  Administered 2022-07-14: 4 mg via INTRAVENOUS

## 2022-07-14 MED ORDER — LACTATED RINGERS IV SOLN: INTRAVENOUS | Status: DC

## 2022-07-14 MED ORDER — STERILE WATER FOR IRRIGATION IR SOLN
Status: DC | PRN
  Administered 2022-07-14: .6 mL

## 2022-07-14 MED ORDER — MAGNESIUM SULFATE 2 GM/50ML IV SOLN
2.0000 g | Freq: Once | INTRAVENOUS | Status: AC
  Administered 2022-07-14: 2 g via INTRAVENOUS
  Filled 2022-07-14: qty 50

## 2022-07-14 MED ORDER — POTASSIUM CHLORIDE 10 MEQ/100ML IV SOLN
10.0000 meq | INTRAVENOUS | Status: AC
  Administered 2022-07-14 (×2): 10 meq via INTRAVENOUS
  Filled 2022-07-14 (×2): qty 100

## 2022-07-14 MED ORDER — ESMOLOL HCL 100 MG/10ML IV SOLN
INTRAVENOUS | Status: DC | PRN
  Administered 2022-07-14: 10 mg via INTRAVENOUS

## 2022-07-14 NOTE — Anesthesia Procedure Notes (Signed)
Procedure Name: Intubation Date/Time: 07/14/2022 2:44 PM  Performed by: Lorin Glass, CRNAPre-anesthesia Checklist: Patient identified, Emergency Drugs available, Suction available and Patient being monitored Patient Re-evaluated:Patient Re-evaluated prior to induction Oxygen Delivery Method: Circle system utilized Preoxygenation: Pre-oxygenation with 100% oxygen Induction Type: IV induction, Rapid sequence and Cricoid Pressure applied Laryngoscope Size: Mac and 4 Grade View: Grade II Tube type: Oral Tube size: 7.5 mm Number of attempts: 1 Airway Equipment and Method: Stylet Placement Confirmation: ETT inserted through vocal cords under direct vision, positive ETCO2 and breath sounds checked- equal and bilateral Secured at: 21 cm Tube secured with: Tape Dental Injury: Teeth and Oropharynx as per pre-operative assessment

## 2022-07-14 NOTE — Anesthesia Postprocedure Evaluation (Signed)
Anesthesia Post Note  Patient: Anthony Ferguson  Procedure(s) Performed: ESOPHAGOGASTRODUODENOSCOPY (EGD) WITH PROPOFOL BIOPSY  Patient location during evaluation: Phase II Anesthesia Type: General Level of consciousness: awake and alert and oriented Pain management: pain level controlled Vital Signs Assessment: post-procedure vital signs reviewed and stable Respiratory status: spontaneous breathing, nonlabored ventilation and respiratory function stable Cardiovascular status: blood pressure returned to baseline and stable Postop Assessment: no apparent nausea or vomiting Anesthetic complications: no  No notable events documented.   Last Vitals:  Vitals:   07/14/22 1515 07/14/22 1536  BP: 139/89 132/81  Pulse: 94 100  Resp: 18 20  Temp:  37.1 C  SpO2: 100% 100%    Last Pain:  Vitals:   07/14/22 1536  TempSrc: Oral  PainSc:                  Stephanne Greeley C Mairi Stagliano

## 2022-07-14 NOTE — Transfer of Care (Signed)
Immediate Anesthesia Transfer of Care Note  Patient: Anthony Ferguson  Procedure(s) Performed: ESOPHAGOGASTRODUODENOSCOPY (EGD) WITH PROPOFOL BIOPSY  Patient Location: PACU  Anesthesia Type:General  Level of Consciousness: awake  Airway & Oxygen Therapy: Patient Spontanous Breathing  Post-op Assessment: Report given to RN and Post -op Vital signs reviewed and stable  Post vital signs: Reviewed and stable  Last Vitals:  Vitals Value Taken Time  BP 128/81   Temp 98.7   Pulse 96 07/14/22 1501  Resp 24 07/14/22 1501  SpO2 100 % 07/14/22 1501  Vitals shown include unvalidated device data.  Last Pain:  Vitals:   07/14/22 1439  TempSrc:   PainSc: 0-No pain         Complications: No notable events documented.

## 2022-07-14 NOTE — Op Note (Signed)
[facility] Patient Name: Anthony Ferguson Procedure Date: 07/14/2022 2:30 PM MRN: [id number] Date of Birth: 02/12/1951 Attending MD: Hennie Duos. Marletta Lor , [place], [id number] CSN: [id number] Age: 72 Admit Type: Inpatient Procedure:                Upper GI endoscopy Indications:              Acute post hemorrhagic anemia, Coffee-ground emesis Providers:                Hennie Duos. Marletta Lor, DO, Jannett Celestine, RN, Dyann Ruddle Referring MD:              Medicines:                See the Anesthesia note for documentation of the                            administered medications Complications:            No immediate complications. Estimated Blood Loss:     Estimated blood loss was minimal. Procedure:                Pre-Anesthesia Assessment:                           - The anesthesia plan was to use general anesthesia.                           After obtaining informed consent, the endoscope was                            passed under direct vision. Throughout the                            procedure, the patient's blood pressure, pulse, and                            oxygen saturations were monitored continuously. The                            GIF-H190 ([id number]) scope was introduced through the                            mouth, and advanced to the second part of duodenum.                            The upper GI endoscopy was accomplished without                            difficulty. The patient tolerated the procedure                            well. Scope In: 2:47:40 PM Scope Out: 2:55:55 PM Total Procedure Duration: 0 hours 8 minutes 15 seconds  Findings:      There is no endoscopic evidence of bleeding, areas of erosion,       esophagitis, ulcerations or varices in the entire esophagus.      A nasogastric tube was  found in the esophagus protruding into gastric       cavity.      Localized severe inflammation characterized by erythema, friability and       shallow ulcerations was found  in the gastric body. Biopsies were taken       with a cold forceps for histology. No active bleeding.      Localized moderate inflammation characterized by erosions and erythema       was found in the gastric antrum.      The duodenal bulb, first portion of the duodenum and second portion of       the duodenum were normal. Impression:               - A gastric tube was found in the esophagus.                           - Gastritis. Biopsied.                           - Gastritis.                           - Normal duodenal bulb, first portion of the                            duodenum and second portion of the duodenum. Moderate Sedation:      Per Anesthesia Care Recommendation:           - Return patient to hospital ward for ongoing care.                           - Full liquid diet.                           - Continue IV PPI BID while inpatient, switch to PO                            40 mg BID upon discarge.                           - NG tube has been removed.                           - Unclear how much of today's exam was a result of                            NG tube trauma vs hemorrhagic gastritis.                           - Continue to monitor H&H and transfuse for less                            than 7                           - I will follow up on biopsies.                           -  Hold Eliquis for 2 more days Procedure Code(s):        --- Professional ---                           [phone], Esophagogastroduodenoscopy, flexible,                            transoral; with biopsy, single or multiple Diagnosis Code(s):        --- Professional ---                           [phone], Other foreign object in esophagus causing                            other injury, initial encounter                           K29.70, Gastritis, unspecified, without bleeding                           D62, Acute posthemorrhagic anemia                           K92.0, Hematemesis CPT copyright  2022 [employer]. All rights reserved. The codes documented in this report are preliminary and upon coder review may  be revised to meet current compliance requirements. Hennie Duos. Marletta Lor, DO Hennie Duos. Marletta Lor, DO 07/14/2022 3:05:05 PM This report has been signed electronically. Number of Addenda: 0

## 2022-07-14 NOTE — Interval H&P Note (Signed)
History and Physical Interval Note:  07/14/2022 2:10 PM  Anthony Ferguson  has presented today for surgery, with the diagnosis of hematemesis, melena, abdominal pain.  The various methods of treatment have been discussed with the patient and family. After consideration of risks, benefits and other options for treatment, the patient has consented to  Procedure(s): ESOPHAGOGASTRODUODENOSCOPY (EGD) WITH PROPOFOL (N/A) as a surgical intervention.  The patient's history has been reviewed, patient examined, no change in status, stable for surgery.  I have reviewed the patient's chart and labs.  Questions were answered to the patient's satisfaction.     [name]

## 2022-07-14 NOTE — Progress Notes (Signed)
Chart reviewed in anticipation for procedure today. Vital signs stable. PPI infusion should be continued until after EGD.  K 3.2, Mg 1.8. Orders for 20 mEq IVPB potassium placed.   Brooke Bonito, MSN, APRN, FNP-BC, AGACNP-BC [facility] at [facility]

## 2022-07-14 NOTE — Progress Notes (Signed)
PROGRESS NOTE   Anthony Ferguson  ZOX:[id number] DOB: 1950-04-07 DOA: 07/12/2022 PCP: Patient, No Pcp Per   Chief Complaint  Patient presents with   Abdominal Pain   Emesis   Level of care: Telemetry  Brief Admission History:   72 y.o. male with medical history significant of HIV, hyperlipidemia, hypertension, type 2 diabetes mellitus, GERD, and more presents the ED with a chief complaint of abdominal pain and nausea.  Patient reports she has had abdominal pain since Tuesday.  He cannot describe if it is a burning, cramping, pressure, sharp.  He reports its "just a pain."  Reports his last bowel movement was Monday but it was a small 1.  On Sunday night he had melanotic stool per his report.  Patient reports an unknown number of episodes of coffee-ground emesis.  1 was witnessed in the ED and there is a picture of it in the media section of chart review.  Output from the NG tube appears to be bloody as well.  Patient reports nausea has improved with NG tube.  He denies any lightheadedness, chest pain, shortness of breath.  He denies any fever.  Patient reports he is never had a GI bleed before.  He reports a history of alcohol use, but cannot quantify it.  He reports no known history of liver disease.  He denies regular use of NSAIDs.  Patient has no other complaints at this time.   Patient does not smoke, does not drink any longer, and does not use illicit drugs.  He is not vaccinated for COVID or flu.  Patient is full code.   Assessment and Plan: * GI bleed - Coffee-ground emesis - melena seen in hospital - Nausea has improved since NG tube was placed - CT abdomen and pelvis showed moderate fluid distention of the stomach with fluid distended distal esophagus recommending NG tube and signs of chronic constipation - GI consulted and plans EGD 5/24. - N.p.o. for now - IV pantoprazole infusion - Hemoglobin down to 9.0 from 10.5 - Continue to monitor every 6 hours - Denies NSAID use -  Reports history of alcohol use - No known liver disease - No NSAID use reported - Appreciate recommendations from GI  Hypokalemia - IV replacement ordered   Hyperlipidemia - Continue statin  GERD (gastroesophageal reflux disease) - Continue IV pantoprazole infusion per GI   Essential hypertension - holding Cardizem, Norvasc, hydralazine due to soft BPs in setting of GI bleed  Type 2 diabetes mellitus (HCC) - Sliding scale coverage - CBG (last 3)  Recent Labs    07/13/22 1110 07/13/22 2136 07/14/22 0724  GLUCAP 95 100* 80     Human immunodeficiency virus (HIV) disease (HCC) - Continue Biktarvy   DVT prophylaxis: SCD Code Status: F Family Communication: *  Disposition: Status is: Inpatient Remains inpatient appropriate because: IV pantoprazole infusion    Consultants:  GI Procedures:  EGD planned for 5/24 >> Antimicrobials:    Subjective: Pt says that he is ready for procedure, no significant bleeding noted, no further black stool like he had yesterday.   Objective: Vitals:   07/13/22 0926 07/13/22 1338 07/13/22 2016 07/14/22 0542  BP: 121/84 131/82 (!) 133/97 132/79  Pulse: 95 90 92 94  Resp: 20 18    Temp: 99 F (37.2 C) 98.5 F (36.9 C) 98.5 F (36.9 C) 99 F (37.2 C)  TempSrc: Axillary Oral Oral Oral  SpO2: 100% 98% 97% 98%  Weight:      Height:  Intake/Output Summary (Last 24 hours) at 07/14/2022 1048 Last data filed at 07/14/2022 0542 Gross per 24 hour  Intake 158.66 ml  Output 2400 ml  Net -2241.34 ml   Filed Weights   07/12/22 1532 07/13/22 0006  Weight: 68 kg 68.3 kg   Examination:  General exam: Appears calm and comfortable  Respiratory system: Clear to auscultation. Respiratory effort normal. Cardiovascular system: normal S1 & S2 heard. No JVD, murmurs, rubs, gallops or clicks. No pedal edema. Gastrointestinal system: Abdomen is nondistended, soft and nontender. No organomegaly or masses felt. Normal bowel sounds  heard. Central nervous system: Alert and oriented. No focal neurological deficits. Extremities: Symmetric 5 x 5 power. Skin: No rashes, lesions or ulcers. Psychiatry: Judgement and insight appear normal. Mood & affect appropriate.   Data Reviewed: I have personally reviewed following labs and imaging studies  CBC: Recent Labs  Lab 07/12/22 1615 07/12/22 2307 07/13/22 0418 07/13/22 1052 07/13/22 1711 07/13/22 2327 07/14/22 0439  WBC 5.2 4.5 4.4  --   --   --  4.6  NEUTROABS 4.2  --  3.5  --   --   --   --   HGB 10.5* 9.5* 9.0* 9.1* 8.9* 8.8* 8.8*  HCT 31.6* 28.7* 27.4* 28.0* 25.9* 26.3* 26.5*  MCV 91.9 92.3 91.0  --   --   --  92.0  PLT 159 138* 134*  --   --   --  125*    Basic Metabolic Panel: Recent Labs  Lab 07/12/22 1615 07/13/22 0418 07/14/22 0439  NA 136 137 138  K 3.3* 3.3* 3.2*  CL 102 105 108  CO2 24 23 23   GLUCOSE 196* 170* 92  BUN 17 21 13   CREATININE 0.77 0.62 0.69  CALCIUM 9.5 8.9 8.5*  MG  --  1.7 1.8    CBG: Recent Labs  Lab 07/13/22 0058 07/13/22 0712 07/13/22 1110 07/13/22 2136 07/14/22 0724  GLUCAP 152* 119* 95 100* 80    No results found for this or any previous visit (from the past 240 hour(s)).   Radiology Studies: DG Chest Port 1 View  Result Date: 07/13/2022 CLINICAL DATA:  NG placement. EXAM: PORTABLE CHEST 1 VIEW COMPARISON:  Chest radiograph dated 10/10/2021. FINDINGS: Enteric tube with tip in the distal esophagus and side port in the mid esophagus. Recommend further advancing by additional 15 cm. Minimal left lung base atelectasis. No focal consolidation, pleural effusion, or pneumothorax. Mild cardiomegaly. Atherosclerotic calcification of the aorta. Median sternotomy wires and CABG vascular clips. No acute osseous pathology. IMPRESSION: Enteric tube with tip in the distal esophagus. Recommend further advancing by additional 15 cm. Electronically Signed   By: Elgie Collard M.D.   On: 07/13/2022 01:22   CT ABDOMEN PELVIS W  CONTRAST  Result Date: 07/12/2022 CLINICAL DATA:  Low abdomen pain EXAM: CT ABDOMEN AND PELVIS WITH CONTRAST TECHNIQUE: Multidetector CT imaging of the abdomen and pelvis was performed using the standard protocol following bolus administration of intravenous contrast. RADIATION DOSE REDUCTION: This exam was performed according to the departmental dose-optimization program which includes automated exposure control, adjustment of the mA and/or kV according to patient size and/or use of iterative reconstruction technique. CONTRAST:  80mL OMNIPAQUE IOHEXOL 300 MG/ML  SOLN COMPARISON:  Radiograph 07/20/2021 FINDINGS: Lower chest: Lung bases demonstrate no acute airspace disease. Coronary vascular calcification. Fluid-filled distal esophageal hernia. Hepatobiliary: Punctate calcification at the gallbladder fundus. No biliary dilatation. Pancreas: Unremarkable. No pancreatic ductal dilatation or surrounding inflammatory changes. Spleen: Normal in size without focal  abnormality. Adrenals/Urinary Tract: Adrenal glands are unremarkable. Kidneys are normal, without renal calculi, focal lesion, or hydronephrosis. Bladder is unremarkable. Stomach/Bowel: Moderate fluid distension of the stomach. No dilated small bowel to suggest small bowel obstruction. Fluid within the colon. Generous sized appendix but with air throughout. Moderate feces in the distal colon with large retained feces at the rectum, rectal distension up to 9.1 cm by formed feces. Mild perirectal thickening without edema. Vascular/Lymphatic: Moderate aortic atherosclerosis. No aneurysm. No suspicious lymph nodes Reproductive: Prostate is unremarkable. Other: Negative for pelvic effusion or free air Musculoskeletal: No acute or suspicious osseous abnormality IMPRESSION: 1. Moderate fluid distension of the stomach with fluid distended distal esophagus. NG decompression as indicated. 2. No evidence for dilated small bowel to suggest bowel obstruction. Fluid within  the colon. Large feces in the distal colon with marked rectal distension by formed stool with mild perirectal wall thickening. 3. Punctate calcification at the gallbladder fundus, possible small stone. 4. Aortic atherosclerosis. Aortic Atherosclerosis (ICD10-I70.0). Electronically Signed   By: Jasmine Pang M.D.   On: 07/12/2022 19:54    Scheduled Meds:  insulin aspart  0-15 Units Subcutaneous TID WC   insulin aspart  0-5 Units Subcutaneous QHS   Continuous Infusions:  magnesium sulfate bolus IVPB     pantoprazole 8 mg/hr (07/13/22 1434)   potassium chloride       LOS: 2 days   Time spent: 36 mins  Reese Stockman Laural Benes, MD How to contact the [facility] Attending or Consulting provider 7A - 7P or covering provider during after hours 7P -7A, for this patient?  Check the care team in [facility] and look for a) attending/consulting TR[facility] provider listed and b) the [facility] team listed Log into [email or web address] and use [facility] universal password to access. If you do not have the password, please contact the hospital operator. Locate the [facility] provider you are looking for under [facility] and page to a number that you can be directly reached. If you still have difficulty reaching the provider, please page the [facility] (Director on Call) for the Hospitalists listed on amion for assistance.  07/14/2022, 10:48 AM

## 2022-07-14 NOTE — Hospital Course (Signed)
72 y.o. male with medical history significant of HIV, hyperlipidemia, hypertension, type 2 diabetes mellitus, GERD, and more presents the ED with a chief complaint of abdominal pain and nausea.  Patient reports she has had abdominal pain since Tuesday.  He cannot describe if it is a burning, cramping, pressure, sharp.  He reports its "just a pain."  Reports his last bowel movement was Monday but it was a small 1.  On Sunday night he had melanotic stool per his report.  Patient reports an unknown number of episodes of coffee-ground emesis.  1 was witnessed in the ED and there is a picture of it in the media section of chart review.  Output from the NG tube appears to be bloody as well.  Patient reports nausea has improved with NG tube.  He denies any lightheadedness, chest pain, shortness of breath.  He denies any fever.  Patient reports he is never had a GI bleed before.  He reports a history of alcohol use, but cannot quantify it.  He reports no known history of liver disease.  He denies regular use of NSAIDs.  Patient has no other complaints at this time.   Patient does not smoke, does not drink any longer, and does not use illicit drugs.  He is not vaccinated for COVID or flu.  Patient is full code.

## 2022-07-14 NOTE — Anesthesia Preprocedure Evaluation (Signed)
Anesthesia Evaluation  Patient identified by MRN, date of birth, ID band Patient awake    Reviewed: Allergy & Precautions, H&P , NPO status , Patient's Chart, lab work & pertinent test results  Airway Mallampati: II  TM Distance: >3 FB Neck ROM: Full    Dental  (+) Edentulous Upper, Edentulous Lower   Pulmonary neg pulmonary ROS, Current Smoker and Patient abstained from smoking.   Pulmonary exam normal breath sounds clear to auscultation       Cardiovascular hypertension, Pt. on medications + CAD and + CABG  Normal cardiovascular exam Rhythm:Regular Rate:Normal     Neuro/Psych CVA, Residual Symptoms  negative psych ROS   GI/Hepatic ,GERD  Medicated,,(+)     substance abuse  cocaine use, Hepatitis -, C  Endo/Other  diabetes, Well Controlled, Type 2, Oral Hypoglycemic Agents    Renal/GU negative Renal ROS  negative genitourinary   Musculoskeletal negative musculoskeletal ROS (+)    Abdominal   Peds negative pediatric ROS (+)  Hematology  (+) Blood dyscrasia, anemia , HIV  Anesthesia Other Findings   Reproductive/Obstetrics negative OB ROS                              Anesthesia Physical Anesthesia Plan  ASA: 4  Anesthesia Plan: General   Post-op Pain Management: Minimal or no pain anticipated   Induction: Intravenous, Rapid sequence and Cricoid pressure planned  PONV Risk Score and Plan: 1  Airway Management Planned: Oral ETT  Additional Equipment:   Intra-op Plan:   Post-operative Plan: Extubation in OR and Possible Post-op intubation/ventilation  Informed Consent: I have reviewed the patients History and Physical, chart, labs and discussed the procedure including the risks, benefits and alternatives for the proposed anesthesia with the patient or authorized representative who has indicated his/her understanding and acceptance.     Dental advisory given  Plan  Discussed with: CRNA and Surgeon  Anesthesia Plan Comments:          Anesthesia Quick Evaluation

## 2022-07-14 NOTE — Care Management Important Message (Signed)
Important Message  Patient Details  Name: MACADE ROBARTS MRN: [id number] Date of Birth: 09/21/1950   Medicare Important Message Given:  Yes     Corey Harold 07/14/2022, 9:40 AM

## 2022-07-15 ENCOUNTER — Telehealth: Payer: Self-pay | Admitting: Internal Medicine

## 2022-07-15 DIAGNOSIS — E876 Hypokalemia: Secondary | ICD-10-CM | POA: Diagnosis not present

## 2022-07-15 DIAGNOSIS — Z7901 Long term (current) use of anticoagulants: Secondary | ICD-10-CM

## 2022-07-15 DIAGNOSIS — K922 Gastrointestinal hemorrhage, unspecified: Secondary | ICD-10-CM | POA: Diagnosis not present

## 2022-07-15 DIAGNOSIS — D62 Acute posthemorrhagic anemia: Secondary | ICD-10-CM | POA: Diagnosis not present

## 2022-07-15 DIAGNOSIS — K920 Hematemesis: Secondary | ICD-10-CM

## 2022-07-15 DIAGNOSIS — K2991 Gastroduodenitis, unspecified, with bleeding: Secondary | ICD-10-CM

## 2022-07-15 DIAGNOSIS — K219 Gastro-esophageal reflux disease without esophagitis: Secondary | ICD-10-CM | POA: Diagnosis not present

## 2022-07-15 LAB — CBC
HCT: 26.9 % — ABNORMAL LOW (ref 39.0–52.0)
Hemoglobin: 9 g/dL — ABNORMAL LOW (ref 13.0–17.0)
MCH: 30.5 pg (ref 26.0–34.0)
MCHC: 33.5 g/dL (ref 30.0–36.0)
MCV: 91.2 fL (ref 80.0–100.0)
Platelets: 132 10*3/uL — ABNORMAL LOW (ref 150–400)
RBC: 2.95 MIL/uL — ABNORMAL LOW (ref 4.22–5.81)
RDW: 14.5 % (ref 11.5–15.5)
WBC: 5 10*3/uL (ref 4.0–10.5)
nRBC: 0 % (ref 0.0–0.2)

## 2022-07-15 LAB — GLUCOSE, CAPILLARY
Glucose-Capillary: 106 mg/dL — ABNORMAL HIGH (ref 70–99)
Glucose-Capillary: 137 mg/dL — ABNORMAL HIGH (ref 70–99)
Glucose-Capillary: 93 mg/dL (ref 70–99)
Glucose-Capillary: 112 mg/dL — ABNORMAL HIGH (ref 70–99)

## 2022-07-15 LAB — BASIC METABOLIC PANEL
Anion gap: 10 (ref 5–15)
BUN: 12 mg/dL (ref 8–23)
CO2: 22 mmol/L (ref 22–32)
Calcium: 8.4 mg/dL — ABNORMAL LOW (ref 8.9–10.3)
Chloride: 105 mmol/L (ref 98–111)
Creatinine, Ser: 0.62 mg/dL (ref 0.61–1.24)
GFR, Estimated: >60 mL/min (ref >60)
Glucose, Bld: 99 mg/dL (ref 70–99)
Potassium: 3.4 mmol/L — ABNORMAL LOW (ref 3.5–5.1)
Sodium: 137 mmol/L (ref 135–145)

## 2022-07-15 LAB — MAGNESIUM: Magnesium: 2 mg/dL (ref 1.7–2.4)

## 2022-07-15 MED ORDER — APIXABAN 5 MG PO TABS: 5.0000 mg | ORAL_TABLET | Freq: Two times a day (BID) | ORAL | 0 refills | Status: AC

## 2022-07-15 MED ORDER — PANTOPRAZOLE SODIUM 40 MG PO TBEC: 40.0000 mg | DELAYED_RELEASE_TABLET | Freq: Two times a day (BID) | ORAL | Status: AC

## 2022-07-15 MED ORDER — GLIPIZIDE 5 MG PO TABS: 5.0000 mg | ORAL_TABLET | Freq: Every day | ORAL | Status: AC

## 2022-07-15 MED ORDER — POTASSIUM CHLORIDE CRYS ER 20 MEQ PO TBCR
40.0000 meq | EXTENDED_RELEASE_TABLET | Freq: Once | ORAL | Status: AC
  Administered 2022-07-15: 40 meq via ORAL
  Filled 2022-07-15: qty 2

## 2022-07-15 MED ORDER — PANTOPRAZOLE SODIUM 40 MG PO TBEC
40.0000 mg | DELAYED_RELEASE_TABLET | Freq: Two times a day (BID) | ORAL | Status: DC
  Administered 2022-07-16: 40 mg via ORAL
  Filled 2022-07-15: qty 1

## 2022-07-15 NOTE — Discharge Instructions (Addendum)
PATIENT CAN RESTART [facility]AN ON 07/17/22  PATIENT SHOULD TAKE PANTOPRAZOLE 40 MG TWICE DAILY  PLEASE CHECK CBC IN 1 WEEK PLEASE FOLLOW UP WITH [facility] IN 3-4 WEEKS   IMPORTANT INFORMATION: PAY CLOSE ATTENTION   PHYSICIAN DISCHARGE INSTRUCTIONS  Follow with Primary care provider  Patient, No Pcp Per  and other consultants as instructed by your Hospitalist Physician  SEEK MEDICAL CARE OR RETURN TO EMERGENCY ROOM IF SYMPTOMS COME BACK, WORSEN OR NEW PROBLEM DEVELOPS   Please note: You were cared for by a hospitalist during your hospital stay. Every effort will be made to forward records to your primary care provider.  You can request that your primary care provider send for your hospital records if they have not received them.  Once you are discharged, your primary care physician will handle any further medical issues. Please note that NO REFILLS for any discharge medications will be authorized once you are discharged, as it is imperative that you return to your primary care physician (or establish a relationship with a primary care physician if you do not have one) for your post hospital discharge needs so that they can reassess your need for medications and monitor your lab values.  Please get a complete blood count and chemistry panel checked by your Primary MD at your next visit, and again as instructed by your Primary MD.  Get Medicines reviewed and adjusted: Please take all your medications with you for your next visit with your Primary MD  Laboratory/radiological data: Please request your Primary MD to go over all hospital tests and procedure/radiological results at the follow up, please ask your primary care provider to get all Hospital records sent to his/her office.  In some cases, they will be blood work, cultures and biopsy results pending at the time of your discharge. Please request that your primary care provider follow up on these results.  If you are diabetic, please  bring your blood sugar readings with you to your follow up appointment with primary care.    Please call and make your follow up appointments as soon as possible.    Also Note the following: If you experience worsening of your admission symptoms, develop shortness of breath, life threatening emergency, suicidal or homicidal thoughts you must seek medical attention immediately by calling 911 or calling your MD immediately  if symptoms less severe.  You must read complete instructions/literature along with all the possible adverse reactions/side effects for all the Medicines you take and that have been prescribed to you. Take any new Medicines after you have completely understood and accpet all the possible adverse reactions/side effects.   Do not drive when taking Pain medications or sleeping medications (Benzodiazepines)  Do not take more than prescribed Pain, Sleep and Anxiety Medications. It is not advisable to combine anxiety,sleep and pain medications without talking with your primary care practitioner  Special Instructions: If you have smoked or chewed Tobacco  in the last 2 yrs please stop smoking, stop any regular Alcohol  and or any Recreational drug use.  Wear Seat belts while driving.  Do not drive if taking any narcotic, mind altering or controlled substances or recreational drugs or alcohol.

## 2022-07-15 NOTE — [place] FL2 (Signed)
[place] MEDICAID FL2 LEVEL OF CARE FORM     IDENTIFICATION  Patient Name: Anthony Ferguson Birthdate: [birth year] Sex: male Admission Date (Current Location): 07/12/2022  [place] and [place] Number:  Anthony Ferguson [id number] Facility and Address:  [facility],  [address] [place] [address]      Provider Number: [id number]  Attending Physician Name and Address:  Cleora Fleet, MD  Relative Name and Phone Number:  Meliton Rattan- [phone]    Current Level of Care: Hospital Recommended Level of Care: Skilled Nursing Facility Prior Approval Number:    Date Approved/Denied:   PASRR Number: [id number]  Discharge Plan: SNF    Current Diagnoses: Patient Active Problem List   Diagnosis Date Noted   Hematemesis 07/14/2022   ABLA (acute blood loss anemia) 07/14/2022   Superficial gastritis with hemorrhage 07/14/2022   Essential hypertension 07/13/2022   GERD (gastroesophageal reflux disease) 07/13/2022   Hyperlipidemia 07/13/2022   Hypokalemia 07/13/2022   GI bleed 07/12/2022   Acute ischemic left MCA stroke (HCC) 05/20/2021   Middle cerebral artery embolism, 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   Protein-calorie malnutrition, severe 05/20/2021   Erectile dysfunction 11/26/2018   Cocaine abuse (HCC) 04/14/2015   Normocytic anemia 04/14/2015   Thrombocytopenia (HCC) 04/14/2015   Human immunodeficiency virus (HIV) disease (HCC) 03/03/2006   Chronic hepatitis C virus infection (HCC) 03/03/2006   Type 2 diabetes mellitus (HCC) 03/03/2006   TOBACCO USER 03/03/2006   Coronary atherosclerosis 03/03/2006    Orientation RESPIRATION BLADDER Height & Weight     Self, Situation, Place  Normal Continent Weight: 150 lb 9.2 oz (68.3 kg) Height:  6\' 3"  (190.5 cm)  BEHAVIORAL SYMPTOMS/MOOD NEUROLOGICAL BOWEL NUTRITION STATUS      Continent Diet  AMBULATORY STATUS COMMUNICATION OF NEEDS Skin   Limited Assist Verbally Normal                       Personal Care Assistance  Level of Assistance  Bathing, Dressing Bathing Assistance: Limited assistance   Dressing Assistance: Limited assistance     Functional Limitations Info  Sight Sight Info: Adequate Hearing Info: Adequate      SPECIAL CARE FACTORS FREQUENCY                       Contractures Contractures Info: Not present    Additional Factors Info  Code Status Code Status Info: Full             Current Medications (07/15/2022):  This is the current hospital active medication list Current Facility-Administered Medications  Medication Dose Route Frequency Provider Last Rate Last Admin   acetaminophen (TYLENOL) tablet 650 mg  650 mg Oral Q6H PRN Zierle-Ghosh, Asia B, DO       Or   acetaminophen (TYLENOL) suppository 650 mg  650 mg Rectal Q6H PRN Zierle-Ghosh, Asia B, DO       hydrALAZINE (APRESOLINE) injection 10 mg  10 mg Intravenous Q8H PRN Zierle-Ghosh, Asia B, DO       insulin aspart (novoLOG) injection 0-15 Units  0-15 Units Subcutaneous TID WC Zierle-Ghosh, Asia B, DO   2 Units at 07/15/22 1259   insulin aspart (novoLOG) injection 0-5 Units  0-5 Units Subcutaneous QHS Zierle-Ghosh, Asia B, DO       morphine (PF) 2 MG/ML injection 1 mg  1 mg Intravenous Q4H PRN Johnson, Clanford L, MD       ondansetron (ZOFRAN) tablet 4 mg  4 mg Oral Q6H PRN Zierle-Ghosh,  Asia B, DO       Or   ondansetron (ZOFRAN) injection 4 mg  4 mg Intravenous Q6H PRN Zierle-Ghosh, Asia B, DO       pantoprozole (PROTONIX) 80 mg /NS 100 mL infusion  8 mg/hr Intravenous Continuous Rhunette Croft, Ankit, MD 10 mL/hr at 07/15/22 0320 8 mg/hr at 07/15/22 0320     Discharge Medications: Please see discharge summary for a list of discharge medications.  Relevant Imaging Results:  Relevant Lab Results:   Additional Information [id number]  Catalina Gravel, LCSW

## 2022-07-15 NOTE — TOC Progression Note (Signed)
Transition of Care [facility]) - Progression Note    Patient Details  Name: MONDRELL ANGLADE MRN: [id number] Date of Birth: 09-12-1950  Transition of Care [facility]) CM/SW Contact  Catalina Gravel, LCSW Phone Number: 07/15/2022, 4:24 PM  Clinical Narrative:    Pt care plan is to return to [facility] when DC ready.  [facility] can accept pt Sunday.  Pt in agreement with return to facility.  CSW attempted to contact family, niece number not good. Son number went to VM, mailbox full.  CSW contacted Debbie at [facility] can accept and will need new FL2 prior to DC  placement.  CSW completed FL2 for MD to co-sign. TOC to follow.      Barriers to Discharge: Continued Medical Work up  Expected Discharge Plan and Services                                               Social Determinants of Health (SDOH) Interventions SDOH Screenings   Depression (PHQ2-9): Low Risk  (01/25/2022)  Tobacco Use: High Risk (07/14/2022)    Readmission Risk Interventions     No data to display

## 2022-07-15 NOTE — Telephone Encounter (Signed)
Please arrange hospital follow-up visit in 3 to 4 weeks with app or Dr. Levon Hedger.  Thank you

## 2022-07-15 NOTE — Progress Notes (Addendum)
PROGRESS NOTE   Anthony Ferguson  ZOX:[id number] DOB: Feb 25, 1950 DOA: 07/12/2022 PCP: Patient, No Pcp Per   Chief Complaint  Patient presents with   Abdominal Pain   Emesis   Level of care: Telemetry  Brief Admission History:   72 y.o. male with medical history significant of HIV, hyperlipidemia, hypertension, type 2 diabetes mellitus, GERD, and more presents the ED with a chief complaint of abdominal pain and nausea.  Patient reports she has had abdominal pain since Tuesday.  He cannot describe if it is a burning, cramping, pressure, sharp.  He reports its "just a pain."  Reports his last bowel movement was Monday but it was a small 1.  On Sunday night he had melanotic stool per his report.  Patient reports an unknown number of episodes of coffee-ground emesis.  1 was witnessed in the ED and there is a picture of it in the media section of chart review.  Output from the NG tube appears to be bloody as well.  Patient reports nausea has improved with NG tube.  He denies any lightheadedness, chest pain, shortness of breath.  He denies any fever.  Patient reports he is never had a GI bleed before.  He reports a history of alcohol use, but cannot quantify it.  He reports no known history of liver disease.  He denies regular use of NSAIDs.  Patient has no other complaints at this time.   Patient does not smoke, does not drink any longer, and does not use illicit drugs.  He is not vaccinated for COVID or flu.  Patient is full code.   Assessment and Plan:  Upper GI bleed - presented with Coffee-ground emesis - melena seen in hospital - Nausea has improved since NG tube was placed - CT abdomen and pelvis showed moderate fluid distention of the stomach with fluid distended distal esophagus recommending NG tube and signs of chronic constipation - GI consulted and s/p EGD 5/24 with findings of hemorrhagic gastritis  - advance to soft diet 5/25  - IV pantoprazole infusion, transition to pantoprazole 40  mg BID  - Denies NSAID use - Reports history of alcohol use - No known liver disease - No NSAID use reported - Appreciate recommendations from GI, ok to DC --discussed with Child psychotherapist, can accept back to SNF on 5/26   Hypokalemia - IV replacement ordered and additional oral dose given 5/25   Hyperlipidemia - Continue statin  GERD (gastroesophageal reflux disease) - Continue pantoprazole per GI recs   Essential hypertension - holding Cardizem, Norvasc, hydralazine due to soft BPs in setting of GI bleed  Type 2 diabetes mellitus - Sliding scale coverage - CBG (last 3)  Recent Labs    07/13/22 1110 07/13/22 2136 07/14/22 0724  GLUCAP 95 100* 80    Human immunodeficiency virus (HIV) disease  - Continue Biktarvy   DVT prophylaxis: SCD Code Status: F Family Communication: *  Disposition: [facility]     Consultants:  GI Procedures:  EGD planned for 5/24 >> Antimicrobials:    Subjective: Pt has been tolerating liquid diet so far.   Objective: Vitals:   07/14/22 2027 07/14/22 2309 07/15/22 0500 07/15/22 1347  BP: 124/84 128/87 131/89 125/80  Pulse: 93 91 91 75  Resp: 18 18 20 16   Temp: 98.3 F (36.8 C) 99 F (37.2 C) 98.1 F (36.7 C) 98 F (36.7 C)  TempSrc: Oral Oral    SpO2: 100% 100% 97% 98%  Weight:  Height:        Intake/Output Summary (Last 24 hours) at 07/15/2022 1652 Last data filed at 07/15/2022 1200 Gross per 24 hour  Intake 545.28 ml  Output 1300 ml  Net -754.72 ml   Filed Weights   07/12/22 1532 07/13/22 0006 07/14/22 1353  Weight: 68 kg 68.3 kg 68.3 kg   Examination:  General exam: Appears calm and comfortable  Respiratory system: Clear to auscultation. Respiratory effort normal. Cardiovascular system: normal S1 & S2 heard. No JVD, murmurs, rubs, gallops or clicks. No pedal edema. Gastrointestinal system: Abdomen is nondistended, soft and nontender. No organomegaly or masses felt. Normal bowel sounds  heard. Central nervous system: Alert and oriented. No focal neurological deficits. Extremities: Symmetric 5 x 5 power. Skin: No rashes, lesions or ulcers. Psychiatry: Judgement and insight appear normal. Mood & affect appropriate.   Data Reviewed: I have personally reviewed following labs and imaging studies  CBC: Recent Labs  Lab 07/12/22 1615 07/12/22 2307 07/13/22 0418 07/13/22 1052 07/13/22 1711 07/13/22 2327 07/14/22 0439 07/15/22 0405  WBC 5.2 4.5 4.4  --   --   --  4.6 5.0  NEUTROABS 4.2  --  3.5  --   --   --   --   --   HGB 10.5* 9.5* 9.0* 9.1* 8.9* 8.8* 8.8* 9.0*  HCT 31.6* 28.7* 27.4* 28.0* 25.9* 26.3* 26.5* 26.9*  MCV 91.9 92.3 91.0  --   --   --  92.0 91.2  PLT 159 138* 134*  --   --   --  125* 132*    Basic Metabolic Panel: Recent Labs  Lab 07/12/22 1615 07/13/22 0418 07/14/22 0439 07/15/22 0405  NA 136 137 138 137  K 3.3* 3.3* 3.2* 3.4*  CL 102 105 108 105  CO2 24 23 23 22   GLUCOSE 196* 170* 92 99  BUN 17 21 13 12   CREATININE 0.77 0.62 0.69 0.62  CALCIUM 9.5 8.9 8.5* 8.4*  MG  --  1.7 1.8 2.0    CBG: Recent Labs  Lab 07/14/22 1606 07/14/22 2052 07/15/22 0728 07/15/22 1119 07/15/22 1627  GLUCAP 85 143* 106* 137* 93    No results found for this or any previous visit (from the past 240 hour(s)).   Radiology Studies: No results found.  Scheduled Meds:  insulin aspart  0-15 Units Subcutaneous TID WC   insulin aspart  0-5 Units Subcutaneous QHS   Continuous Infusions:  pantoprazole 8 mg/hr (07/15/22 0320)    LOS: 3 days   Time spent: 35 mins  Levonne Carreras Laural Benes, MD How to contact the [facility] Attending or Consulting provider 7A - 7P or covering provider during after hours 7P -7A, for this patient?  Check the care team in [facility] and look for a) attending/consulting TR[facility] provider listed and b) the [facility] team listed Log into [email or web address] and use [place]'s universal password to access. If you do not have the password, please contact the hospital  operator. Locate the [facility] provider you are looking for under [facility] and page to a number that you can be directly reached. If you still have difficulty reaching the provider, please page the [facility] (Director on Call) for the Hospitalists listed on amion for assistance.  07/15/2022, 4:52 PM

## 2022-07-15 NOTE — Progress Notes (Signed)
Subjective: Patient without complaints for me this morning.  Tolerating full liquid diet.  No nausea or vomiting.  No melena.  Hemoglobin stable.  Objective: Vital signs in last 24 hours: Temp:  [98.1 F (36.7 C)-99 F (37.2 C)] 98.1 F (36.7 C) (05/25 0500) Pulse Rate:  [91-101] 91 (05/25 0500) Resp:  [13-24] 20 (05/25 0500) BP: (124-143)/(77-91) 131/89 (05/25 0500) SpO2:  [97 %-100 %] 97 % (05/25 0500) Weight:  [68.3 kg] 68.3 kg (05/24 1353) Last BM Date : 07/14/22 General:   Alert and oriented, pleasant Head:  Normocephalic and atraumatic. Eyes:  No icterus, sclera clear. Conjuctiva pink.  Abdomen:  Bowel sounds present, soft, non-tender, non-distended. No HSM or hernias noted. No rebound or guarding. No masses appreciated  Msk:  Symmetrical without gross deformities. Normal posture. Extremities:  Without clubbing or edema. Neurologic:  Alert and  oriented x4;  grossly normal neurologically. Skin:  Warm and dry, intact without significant lesions.  Cervical Nodes:  No significant cervical adenopathy. Psych:  Alert and cooperative. Normal mood and affect.  Intake/Output from previous day: 05/24 0701 - 05/25 0700 In: 345.3 [I.V.:345.3] Out: 1400 [Urine:1300] Intake/Output this shift: Total I/O In: 200 [P.O.:200] Out: -   Lab Results: Recent Labs    07/13/22 0418 07/13/22 1052 07/13/22 2327 07/14/22 0439 07/15/22 0405  WBC 4.4  --   --  4.6 5.0  HGB 9.0*   < > 8.8* 8.8* 9.0*  HCT 27.4*   < > 26.3* 26.5* 26.9*  PLT 134*  --   --  125* 132*   < > = values in this interval not displayed.   BMET Recent Labs    07/13/22 0418 07/14/22 0439 07/15/22 0405  NA 137 138 137  K 3.3* 3.2* 3.4*  CL 105 108 105  CO2 23 23 22   GLUCOSE 170* 92 99  BUN 21 13 12   CREATININE 0.62 0.69 0.62  CALCIUM 8.9 8.5* 8.4*   LFT Recent Labs    07/12/22 1615 07/13/22 0418  PROT 8.2* 7.3  ALBUMIN 4.3 3.7  AST 21 16  ALT 13 13  ALKPHOS 59 45  BILITOT 0.9 1.0   PT/INR No  results for input(s): "LABPROT", "INR" in the last 72 hours. Hepatitis Panel No results for input(s): "HEPBSAG", "HCVAB", "HEPAIGM", "HEPBIGM" in the last 72 hours.   Studies/Results: No results found.  Assessment: *Upper GI bleed *Acute blood loss anemia *Chronic anticoagulation with Eliquis  Plan: Patient's status post EGD 07/14/2022 which showed hemorrhagic gastritis, superficial ulceration, ?component of NGT trauma.  Hemoglobin stable today.  Denies any nausea or vomiting.  No melena.  Advance to soft diet.  Continue on PPI 40 mg twice daily.  Hold Eliquis for 1 more day.  Repeat EGD in 4 to 6 weeks which I will arrange.  I will follow up on biopsies when resulted.   MiraLAX 1-2 times daily.  Repeat CBC in 1 week.  Hennie Duos. Marletta Lor, D.O. Gastroenterology and Hepatology [facility]   LOS: 3 days    07/15/2022, 1:09 PM

## 2022-07-15 NOTE — Progress Notes (Signed)
Patient alert and oriented x3, patient has slept on and off through the night. No complaints of nausea or vomiting. No complaints of pain.

## 2022-07-16 DIAGNOSIS — K219 Gastro-esophageal reflux disease without esophagitis: Secondary | ICD-10-CM | POA: Diagnosis not present

## 2022-07-16 DIAGNOSIS — B20 Human immunodeficiency virus [HIV] disease: Secondary | ICD-10-CM | POA: Diagnosis not present

## 2022-07-16 DIAGNOSIS — E785 Hyperlipidemia, unspecified: Secondary | ICD-10-CM | POA: Diagnosis not present

## 2022-07-16 DIAGNOSIS — K2971 Gastritis, unspecified, with bleeding: Secondary | ICD-10-CM | POA: Diagnosis not present

## 2022-07-16 LAB — GLUCOSE, CAPILLARY
Glucose-Capillary: 126 mg/dL — ABNORMAL HIGH (ref 70–99)
Glucose-Capillary: 148 mg/dL — ABNORMAL HIGH (ref 70–99)

## 2022-07-16 NOTE — Progress Notes (Signed)
Report has been given to nurse Amy at [facility].

## 2022-07-16 NOTE — TOC Transition Note (Signed)
Transition of Care [facility]) - CM/SW Discharge Note   Patient Details  Name: Anthony Ferguson MRN: [id number] Date of Birth: May 15, 1950  [facility] CM/SW Contact:  Catalina Gravel, LCSW Phone Number: 07/16/2022, 11:08 AM   Clinical Narrative:    Clovis Cao completed 5/25. CSW contacted facility and verified pt return. Room- B20-1 and call report number provided to RN via secure chat. [facility] contacted for transport and RN advised.  Attempt to reach son unsuccessful today and CSW attempted on 5/25, leaving a message. DC    Final next level of care: Skilled Nursing Facility Barriers to Discharge: No Barriers Identified   Patient Goals and CMS Choice      Discharge Placement                         Discharge Plan and Services Additional resources added to the After Visit Summary for                                       Social Determinants of Health (SDOH) Interventions SDOH Screenings   Food Insecurity: No Food Insecurity (07/15/2022)  Housing: Low Risk  (07/15/2022)  Transportation Needs: No Transportation Needs (07/15/2022)  Utilities: Not At Risk (07/15/2022)  Depression (PHQ2-9): Low Risk  (01/25/2022)  Tobacco Use: High Risk (07/14/2022)     Readmission Risk Interventions     No data to display

## 2022-07-16 NOTE — Discharge Summary (Signed)
Physician Discharge Summary  Anthony Ferguson ZOX:[id number] DOB: 12-04-1950 DOA: 07/12/2022  GI: [facility]   Admit date: 07/12/2022 Discharge date: 07/16/2022  Admitted From:  [facility]  Disposition:  [facility]   Recommendations for Outpatient Follow-up:  Follow up with [facility]  in 3 weeks Please obtain CBC in 1 week to monitor hemoglobin  Please follow up on the following pending results: GI biopsies from endoscopy Please monitor BP and could restart BP meds if needed   Home Health: SNF   Discharge Condition: STABLE   CODE STATUS: FULL DIET: soft foods recommended for at least 1 week then regular diet  Brief Hospitalization Summary: Please see all hospital notes, images, labs for full details of the hospitalization. ADMISSION PROVIDER  HPI:  72 y.o. male with medical history significant of HIV, hyperlipidemia, hypertension, type 2 diabetes mellitus, GERD, and more presents the ED with a chief complaint of abdominal pain and nausea.  Patient reports she has had abdominal pain since Tuesday.  He cannot describe if it is a burning, cramping, pressure, sharp.  He reports its "just a pain."  Reports his last bowel movement was Monday but it was a small 1.  On Sunday night he had melanotic stool per his report.  Patient reports an unknown number of episodes of coffee-ground emesis.  1 was witnessed in the ED and there is a picture of it in the media section of chart review.  Output from the NG tube appears to be bloody as well.  Patient reports nausea has improved with NG tube.  He denies any lightheadedness, chest pain, shortness of breath.  He denies any fever.  Patient reports he is never had a GI bleed before.  He reports a history of alcohol use, but cannot quantify it.  He reports no known history of liver disease.  He denies regular use of NSAIDs.  Patient has no other complaints at this time.   Patient does not smoke, does not drink any longer, and does not use  illicit drugs.  He is not vaccinated for COVID or flu.  Patient is full code.  HOSPITAL COURSE BY PROBLEM LIST  Upper GI bleed - presented with Coffee-ground emesis - melena seen in hospital - Nausea has improved AND NG tube was placed temporarily, removed during endoscopy - CT abdomen and pelvis showed moderate fluid distention of the stomach with fluid distended distal esophagus recommending NG tube and signs of chronic constipation - GI consulted and s/p EGD 5/24 with findings of hemorrhagic gastritis- see report  - advanced to soft diet 5/25  - treated with IV pantoprazole infusion, transitioned to pantoprazole 40 mg BID  - Denies NSAID use - Reports history of alcohol use but not recent  - No known liver disease - No NSAID use reported - Appreciate recommendations from GI, ok to DC --discussed with Child psychotherapist, can accept back to SNF on 5/26    Hypokalemia - IV replacement ordered and additional oral dose given 5/25 and repleted     Hyperlipidemia - Continue statin   GERD (gastroesophageal reflux disease) - Continue pantoprazole per GI recs    Essential hypertension - holding Cardizem, Norvasc, hydralazine due to soft BPs in setting of GI bleed   Type 2 diabetes mellitus - Sliding scale insulin coverage in hospital only  -CBG (last 3)  Recent Labs    07/15/22 1627 07/15/22 2146 07/16/22 0711  GLUCAP 93 112* 126*    Human immunodeficiency virus (HIV) disease  -  Continue Biktarvy     DVT prophylaxis: SCD Code Status: F Family Communication: none present Disposition: return to [facility] on 5/26      Consultants:  GI Procedures:  EGD planned for 5/24 >>  Discharge Diagnoses:  Principal Problem:   GI bleed Active Problems:   Human immunodeficiency virus (HIV) disease (HCC)   Type 2 diabetes mellitus (HCC)   Essential hypertension   GERD (gastroesophageal reflux disease)   Hyperlipidemia   Hypokalemia   Hematemesis   ABLA (acute blood loss  anemia)   Superficial gastritis with hemorrhage   Discharge Instructions:  Allergies as of 07/16/2022       Reactions   Hctz [hydrochlorothiazide] Other (See Comments)   "ran my sugar up"   Pork-derived Products Palpitations, Hypertension        Medication List     STOP taking these medications    amLODipine 10 MG tablet Commonly known as: NORVASC   Biofreeze 4 % Gel Generic drug: Menthol (Topical Analgesic)   hydrALAZINE 50 MG tablet Commonly known as: APRESOLINE   omeprazole 20 MG capsule Commonly known as: PRILOSEC   senna-docusate 8.6-50 MG tablet Commonly known as: Senokot-S       TAKE these medications    apixaban 5 MG Tabs tablet Commonly known as: ELIQUIS Take 1 tablet (5 mg total) by mouth 2 (two) times daily. Start taking on: Jul 17, 2022   bictegravir-emtricitabine-tenofovir AF 50-200-25 MG Tabs tablet Commonly known as: BIKTARVY Take 1 tablet by mouth daily.   chlorhexidine 0.12 % solution Commonly known as: PERIDEX 15 mLs by Mouth Rinse route 2 (two) times daily.   feeding supplement (GLUCERNA SHAKE) Liqd Take 237 mLs by mouth 3 (three) times daily between meals.   folic acid 1 MG tablet Commonly known as: FOLVITE Take 1 tablet (1 mg total) by mouth daily.   gabapentin 300 MG capsule Commonly known as: NEURONTIN Take 600 mg by mouth 3 (three) times daily.   glipiZIDE 5 MG tablet Commonly known as: GLUCOTROL Take 1 tablet (5 mg total) by mouth daily before breakfast. What changed:  medication strength how much to take when to take this   Glucagon Emergency 1 MG Kit Inject into the muscle.   glucose blood test strip Use as instructed   HYDROcodone-acetaminophen 5-325 MG tablet Commonly known as: NORCO/VICODIN Take 1 tablet by mouth 4 (four) times daily.   omega-3 acid ethyl esters 1 g capsule Commonly known as: LOVAZA Take 1 capsule by mouth 2 (two) times daily.   pantoprazole 40 MG tablet Commonly known as:  PROTONIX Take 1 tablet (40 mg total) by mouth 2 (two) times daily.   polyethylene glycol 17 g packet Commonly known as: MiraLax Take 17 g by mouth daily.   rosuvastatin 5 MG tablet Commonly known as: CRESTOR Take by mouth.   thiamine 100 MG tablet Commonly known as: VITAMIN B1 Take 1 tablet (100 mg total) by mouth daily.        Follow-up Information     [facility]. Schedule an appointment as soon as possible for a visit in 3 week(s).   Specialty: Gastroenterology Why: Hospital Follow Up Contact information: [address] [place] [address] [phone]               Allergies  Allergen Reactions   Hctz [Hydrochlorothiazide] Other (See Comments)    "ran my sugar up"   Pork-Derived Products Palpitations and Hypertension   Allergies as of 07/16/2022  Reactions   Hctz [hydrochlorothiazide] Other (See Comments)   "ran my sugar up"   Pork-derived Products Palpitations, Hypertension        Medication List     STOP taking these medications    amLODipine 10 MG tablet Commonly known as: NORVASC   Biofreeze 4 % Gel Generic drug: Menthol (Topical Analgesic)   hydrALAZINE 50 MG tablet Commonly known as: APRESOLINE   omeprazole 20 MG capsule Commonly known as: PRILOSEC   senna-docusate 8.6-50 MG tablet Commonly known as: Senokot-S       TAKE these medications    apixaban 5 MG Tabs tablet Commonly known as: ELIQUIS Take 1 tablet (5 mg total) by mouth 2 (two) times daily. Start taking on: Jul 17, 2022   bictegravir-emtricitabine-tenofovir AF 50-200-25 MG Tabs tablet Commonly known as: BIKTARVY Take 1 tablet by mouth daily.   chlorhexidine 0.12 % solution Commonly known as: PERIDEX 15 mLs by Mouth Rinse route 2 (two) times daily.   feeding supplement (GLUCERNA SHAKE) Liqd Take 237 mLs by mouth 3 (three) times daily between meals.   folic acid 1 MG tablet Commonly known as:  FOLVITE Take 1 tablet (1 mg total) by mouth daily.   gabapentin 300 MG capsule Commonly known as: NEURONTIN Take 600 mg by mouth 3 (three) times daily.   glipiZIDE 5 MG tablet Commonly known as: GLUCOTROL Take 1 tablet (5 mg total) by mouth daily before breakfast. What changed:  medication strength how much to take when to take this   Glucagon Emergency 1 MG Kit Inject into the muscle.   glucose blood test strip Use as instructed   HYDROcodone-acetaminophen 5-325 MG tablet Commonly known as: NORCO/VICODIN Take 1 tablet by mouth 4 (four) times daily.   omega-3 acid ethyl esters 1 g capsule Commonly known as: LOVAZA Take 1 capsule by mouth 2 (two) times daily.   pantoprazole 40 MG tablet Commonly known as: PROTONIX Take 1 tablet (40 mg total) by mouth 2 (two) times daily.   polyethylene glycol 17 g packet Commonly known as: MiraLax Take 17 g by mouth daily.   rosuvastatin 5 MG tablet Commonly known as: CRESTOR Take by mouth.   thiamine 100 MG tablet Commonly known as: VITAMIN B1 Take 1 tablet (100 mg total) by mouth daily.        Procedures/Studies: DG Chest Port 1 View  Result Date: 07/13/2022 CLINICAL DATA:  NG placement. EXAM: PORTABLE CHEST 1 VIEW COMPARISON:  Chest radiograph dated 10/10/2021. FINDINGS: Enteric tube with tip in the distal esophagus and side port in the mid esophagus. Recommend further advancing by additional 15 cm. Minimal left lung base atelectasis. No focal consolidation, pleural effusion, or pneumothorax. Mild cardiomegaly. Atherosclerotic calcification of the aorta. Median sternotomy wires and CABG vascular clips. No acute osseous pathology. IMPRESSION: Enteric tube with tip in the distal esophagus. Recommend further advancing by additional 15 cm. Electronically Signed   By: Elgie Collard M.D.   On: 07/13/2022 01:22   CT ABDOMEN PELVIS W CONTRAST  Result Date: 07/12/2022 CLINICAL DATA:  Low abdomen pain EXAM: CT ABDOMEN AND PELVIS  WITH CONTRAST TECHNIQUE: Multidetector CT imaging of the abdomen and pelvis was performed using the standard protocol following bolus administration of intravenous contrast. RADIATION DOSE REDUCTION: This exam was performed according to the departmental dose-optimization program which includes automated exposure control, adjustment of the mA and/or kV according to patient size and/or use of iterative reconstruction technique. CONTRAST:  80mL OMNIPAQUE IOHEXOL 300 MG/ML  SOLN COMPARISON:  Radiograph 07/20/2021  FINDINGS: Lower chest: Lung bases demonstrate no acute airspace disease. Coronary vascular calcification. Fluid-filled distal esophageal hernia. Hepatobiliary: Punctate calcification at the gallbladder fundus. No biliary dilatation. Pancreas: Unremarkable. No pancreatic ductal dilatation or surrounding inflammatory changes. Spleen: Normal in size without focal abnormality. Adrenals/Urinary Tract: Adrenal glands are unremarkable. Kidneys are normal, without renal calculi, focal lesion, or hydronephrosis. Bladder is unremarkable. Stomach/Bowel: Moderate fluid distension of the stomach. No dilated small bowel to suggest small bowel obstruction. Fluid within the colon. Generous sized appendix but with air throughout. Moderate feces in the distal colon with large retained feces at the rectum, rectal distension up to 9.1 cm by formed feces. Mild perirectal thickening without edema. Vascular/Lymphatic: Moderate aortic atherosclerosis. No aneurysm. No suspicious lymph nodes Reproductive: Prostate is unremarkable. Other: Negative for pelvic effusion or free air Musculoskeletal: No acute or suspicious osseous abnormality IMPRESSION: 1. Moderate fluid distension of the stomach with fluid distended distal esophagus. NG decompression as indicated. 2. No evidence for dilated small bowel to suggest bowel obstruction. Fluid within the colon. Large feces in the distal colon with marked rectal distension by formed stool with  mild perirectal wall thickening. 3. Punctate calcification at the gallbladder fundus, possible small stone. 4. Aortic atherosclerosis. Aortic Atherosclerosis (ICD10-I70.0). Electronically Signed   By: Jasmine Pang M.D.   On: 07/12/2022 19:54     Subjective: Pt without any complaints, tolerating soft diet  Discharge Exam: Vitals:   07/15/22 2034 07/16/22 0434  BP: 139/87 124/87  Pulse: 78 85  Resp: 16 18  Temp: 98.3 F (36.8 C) 98.2 F (36.8 C)  SpO2: 100% 100%   Vitals:   07/15/22 0500 07/15/22 1347 07/15/22 2034 07/16/22 0434  BP: 131/89 125/80 139/87 124/87  Pulse: 91 75 78 85  Resp: 20 16 16 18   Temp: 98.1 F (36.7 C) 98 F (36.7 C) 98.3 F (36.8 C) 98.2 F (36.8 C)  TempSrc:   Oral Oral  SpO2: 97% 98% 100% 100%  Weight:      Height:        General: Pt is alert, awake, not in acute distress Cardiovascular: RRR, S1/S2 +, no rubs, no gallops Respiratory: CTA bilaterally, no wheezing, no rhonchi Abdominal: Soft, NT, ND, bowel sounds + Extremities: no edema, no cyanosis   The results of significant diagnostics from this hospitalization (including imaging, microbiology, ancillary and laboratory) are listed below for reference.     Microbiology: No results found for this or any previous visit (from the past 240 hour(s)).   Labs: BNP (last 3 results) No results for input(s): "BNP" in the last 8760 hours. Basic Metabolic Panel: Recent Labs  Lab 07/12/22 1615 07/13/22 0418 07/14/22 0439 07/15/22 0405  NA 136 137 138 137  K 3.3* 3.3* 3.2* 3.4*  CL 102 105 108 105  CO2 24 23 23 22   GLUCOSE 196* 170* 92 99  BUN 17 21 13 12   CREATININE 0.77 0.62 0.69 0.62  CALCIUM 9.5 8.9 8.5* 8.4*  MG  --  1.7 1.8 2.0   Liver Function Tests: Recent Labs  Lab 07/12/22 1615 07/13/22 0418  AST 21 16  ALT 13 13  ALKPHOS 59 45  BILITOT 0.9 1.0  PROT 8.2* 7.3  ALBUMIN 4.3 3.7   Recent Labs  Lab 07/12/22 1615  LIPASE 23   No results for input(s): "AMMONIA" in the last  168 hours. CBC: Recent Labs  Lab 07/12/22 1615 07/12/22 2307 07/13/22 0418 07/13/22 1052 07/13/22 1711 07/13/22 2327 07/14/22 0439 07/15/22 0405  WBC 5.2  4.5 4.4  --   --   --  4.6 5.0  NEUTROABS 4.2  --  3.5  --   --   --   --   --   HGB 10.5* 9.5* 9.0* 9.1* 8.9* 8.8* 8.8* 9.0*  HCT 31.6* 28.7* 27.4* 28.0* 25.9* 26.3* 26.5* 26.9*  MCV 91.9 92.3 91.0  --   --   --  92.0 91.2  PLT 159 138* 134*  --   --   --  125* 132*   Cardiac Enzymes: No results for input(s): "CKTOTAL", "CKMB", "CKMBINDEX", "TROPONINI" in the last 168 hours. BNP: Invalid input(s): "POCBNP" CBG: Recent Labs  Lab 07/15/22 0728 07/15/22 1119 07/15/22 1627 07/15/22 2146 07/16/22 0711  GLUCAP 106* 137* 93 112* 126*   D-Dimer No results for input(s): "DDIMER" in the last 72 hours. Hgb A1c No results for input(s): "HGBA1C" in the last 72 hours. Lipid Profile No results for input(s): "CHOL", "HDL", "LDLCALC", "TRIG", "CHOLHDL", "LDLDIRECT" in the last 72 hours. Thyroid function studies No results for input(s): "TSH", "T4TOTAL", "T3FREE", "THYROIDAB" in the last 72 hours.  Invalid input(s): "FREET3" Anemia work up No results for input(s): "VITAMINB12", "FOLATE", "FERRITIN", "TIBC", "IRON", "RETICCTPCT" in the last 72 hours. Urinalysis    Component Value Date/Time   COLORURINE YELLOW 07/12/2022 1710   APPEARANCEUR CLEAR 07/12/2022 1710   LABSPEC 1.019 07/12/2022 1710   PHURINE 5.0 07/12/2022 1710   GLUCOSEU NEGATIVE 07/12/2022 1710   GLUCOSEU NEG mg/dL 16/11/9602 5409   HGBUR NEGATIVE 07/12/2022 1710   BILIRUBINUR NEGATIVE 07/12/2022 1710   KETONESUR NEGATIVE 07/12/2022 1710   PROTEINUR 30 (A) 07/12/2022 1710   UROBILINOGEN 0.2 10/24/2007 2102   NITRITE NEGATIVE 07/12/2022 1710   LEUKOCYTESUR NEGATIVE 07/12/2022 1710   Sepsis Labs Recent Labs  Lab 07/12/22 2307 07/13/22 0418 07/14/22 0439 07/15/22 0405  WBC 4.5 4.4 4.6 5.0   Microbiology No results found for this or any previous visit  (from the past 240 hour(s)).  Time coordinating discharge: 36 mins   SIGNED:  Standley Dakins, MD  [facility] 07/16/2022, 9:42 AM How to contact the [facility] Attending or Consulting provider 7A - 7P or covering provider during after hours 7P -7A, for this patient?  Check the care team in [facility] and look for a) attending/consulting [facility] provider listed and b) the [facility] team listed Log into [email or web address] and use [facility] universal password to access. If you do not have the password, please contact the hospital operator. Locate the [facility] provider you are looking for under [facility] and page to a number that you can be directly reached. If you still have difficulty reaching the provider, please page the [facility] (Director on Call) for the Hospitalists listed on amion for assistance.

## 2022-07-19 LAB — SURGICAL PATHOLOGY

## 2022-07-21 ENCOUNTER — Encounter (HOSPITAL_COMMUNITY): Payer: Self-pay | Admitting: Internal Medicine

## 2022-07-27 ENCOUNTER — Ambulatory Visit: Payer: Medicare (Managed Care) | Admitting: Internal Medicine

## 2022-08-08 ENCOUNTER — Inpatient Hospital Stay: Payer: Medicare (Managed Care) | Admitting: Gastroenterology

## 2022-08-10 ENCOUNTER — Other Ambulatory Visit: Payer: Self-pay

## 2022-08-10 ENCOUNTER — Encounter: Payer: Self-pay | Admitting: Infectious Disease

## 2022-08-10 ENCOUNTER — Ambulatory Visit (INDEPENDENT_AMBULATORY_CARE_PROVIDER_SITE_OTHER): Payer: Medicare (Managed Care) | Admitting: Infectious Disease

## 2022-08-10 VITALS — BP 129/81 | HR 74 | Temp 97.6°F | Resp 16

## 2022-08-10 DIAGNOSIS — B20 Human immunodeficiency virus [HIV] disease: Secondary | ICD-10-CM

## 2022-08-10 DIAGNOSIS — I25119 Atherosclerotic heart disease of native coronary artery with unspecified angina pectoris: Secondary | ICD-10-CM | POA: Diagnosis not present

## 2022-08-10 DIAGNOSIS — I6602 Occlusion and stenosis of left middle cerebral artery: Secondary | ICD-10-CM

## 2022-08-10 DIAGNOSIS — K2901 Acute gastritis with bleeding: Secondary | ICD-10-CM | POA: Diagnosis not present

## 2022-08-10 LAB — COMPLETE METABOLIC PANEL WITH GFR
AG Ratio: 1.3 (calc) (ref 1.0–2.5)
Chloride: 105 mmol/L (ref 98–110)
Glucose, Bld: 185 mg/dL — ABNORMAL HIGH (ref 65–99)
eGFR: 94 mL/min/{1.73_m2} (ref >=60)

## 2022-08-10 LAB — LIPID PANEL
HDL: 69 mg/dL (ref >=40)
LDL Cholesterol (Calc): 71 mg/dL (calc)
Non-HDL Cholesterol (Calc): 87 mg/dL (calc) (ref <130)
Total CHOL/HDL Ratio: 2.3 (calc) (ref <5.0)

## 2022-08-10 LAB — CBC WITH DIFFERENTIAL/PLATELET
Eosinophils Relative: 3.7 %
HCT: 32.1 % — ABNORMAL LOW (ref 38.5–50.0)
Hemoglobin: 10.4 g/dL — ABNORMAL LOW (ref 13.2–17.1)
MCHC: 32.4 g/dL (ref 32.0–36.0)
MCV: 91.7 fL (ref 80.0–100.0)
Neutrophils Relative %: 48.5 %
Platelets: 194 10*3/uL (ref 140–400)
WBC: 3.3 10*3/uL — ABNORMAL LOW (ref 3.8–10.8)

## 2022-08-10 MED ORDER — BICTEGRAVIR-EMTRICITAB-TENOFOV 50-200-25 MG PO TABS: 1.0000 | ORAL_TABLET | Freq: Every day | ORAL | 11 refills | Status: DC

## 2022-08-10 NOTE — Progress Notes (Signed)
Subjective:  Chief complaint here to follow-up for his HIV disease on Biktarvy  Patient ID: Anthony Ferguson, male    DOB: 1950-07-31, 72 y.o.   MRN: [id number]  HPI  Anthony Ferguson is a 72 year old Black man living with HIV has been previously followed by my partner Dr. Orvan Falconer.  He does have comorbid history of stroke with hemiparesis that is caused him to be wheelchair-bound and reside in a skilled nursing facility.  He also has type 2 diabetes mellitus coronary artery disease he was recently admitted to hospital for upper GI bleeding underwent endoscopy and had resolution of bleed.  He is continue to reside at skilled facility him taking his BIKTARVY as prescribed.    Past Medical History:  Diagnosis Date   HIV (human immunodeficiency virus infection) (HCC)    Hypercholesteremia    Hypertension    Type 2 diabetes mellitus (HCC)     Past Surgical History:  Procedure Laterality Date   BIOPSY  07/14/2022   Procedure: BIOPSY;  Surgeon: [name], DO;  Location: [facility];  Service: Endoscopy;;   CARDIAC SURGERY     CIRCUMCISION     CORONARY ARTERY BYPASS GRAFT     ESOPHAGOGASTRODUODENOSCOPY (EGD) WITH PROPOFOL N/A 07/14/2022   Procedure: ESOPHAGOGASTRODUODENOSCOPY (EGD) WITH PROPOFOL;  Surgeon: [name], DO;  Location: [facility];  Service: Endoscopy;  Laterality: N/A;   IR CT HEAD LTD  05/20/2021   IR PERCUTANEOUS ART THROMBECTOMY/INFUSION INTRACRANIAL INC DIAG ANGIO  05/20/2021   RADIOLOGY WITH ANESTHESIA N/A 05/19/2021   Procedure: IR WITH ANESTHESIA;  Surgeon: Julieanne Cotton, MD;  Location: [facility] OR;  Service: Radiology;  Laterality: N/A;    Family History  Problem Relation Age of Onset   Diabetes Mother    Heart failure Mother       Social History   Socioeconomic History   Marital status: Legally Separated    Spouse name: Not on file   Number of children: Not on file   Years of education: Not on file   Highest education level: Not on file   Occupational History   Not on file  Tobacco Use   Smoking status: Former    Packs/day: .3    Types: Cigarettes   Smokeless tobacco: Never   Tobacco comments:    Trying to slow down  Vaping Use   Vaping Use: Never used  Substance and Sexual Activity   Alcohol use: Yes    Alcohol/week: 5.0 standard drinks of alcohol    Types: 5 Cans of beer per week    Comment: occas   Drug use: Not Currently    Types: Cocaine    Comment: last use 07/22/16   Sexual activity: Not Currently    Partners: Female    Comment: condoms declined  Other Topics Concern   Not on file  Social History Narrative   Not on file   Social Determinants of Health   Financial Resource Strain: Not on file  Food Insecurity: No Food Insecurity (07/15/2022)   Hunger Vital Sign    Worried About Running Out of Food in the Last Year: Never true    Ran Out of Food in the Last Year: Never true  Transportation Needs: No Transportation Needs (07/15/2022)   PRAPARE - Administrator, Civil Service (Medical): No    Lack of Transportation (Non-Medical): No  Physical Activity: Not on file  Stress: Not on file  Social Connections: Not on file    Allergies  Allergen Reactions   Hctz [Hydrochlorothiazide] Other (See Comments)    "ran my sugar up"   Pork-Derived Products Palpitations and Hypertension     Current Outpatient Medications:    apixaban (ELIQUIS) 5 MG TABS tablet, Take 1 tablet (5 mg total) by mouth 2 (two) times daily., Disp: 60 tablet, Rfl: 0   bictegravir-emtricitabine-tenofovir AF (BIKTARVY) 50-200-25 MG TABS tablet, Take 1 tablet by mouth daily., Disp: 30 tablet, Rfl: 11   chlorhexidine (PERIDEX) 0.12 % solution, 15 mLs by Mouth Rinse route 2 (two) times daily., Disp: 120 mL, Rfl: 0   feeding supplement, GLUCERNA SHAKE, (GLUCERNA SHAKE) LIQD, Take 237 mLs by mouth 3 (three) times daily between meals., Disp: , Rfl: 0   folic acid (FOLVITE) 1 MG tablet, Take 1 tablet (1 mg total) by mouth daily.,  Disp: , Rfl:    gabapentin (NEURONTIN) 300 MG capsule, Take 600 mg by mouth 3 (three) times daily., Disp: , Rfl:    glipiZIDE (GLUCOTROL) 5 MG tablet, Take 1 tablet (5 mg total) by mouth daily before breakfast., Disp: , Rfl:    Glucagon, rDNA, (GLUCAGON EMERGENCY) 1 MG KIT, Inject into the muscle., Disp: , Rfl:    glucose blood test strip, Use as instructed, Disp: 100 each, Rfl: 12   HYDROcodone-acetaminophen (NORCO/VICODIN) 5-325 MG tablet, Take 1 tablet by mouth 4 (four) times daily., Disp: , Rfl:    omega-3 acid ethyl esters (LOVAZA) 1 g capsule, Take 1 capsule by mouth 2 (two) times daily., Disp: , Rfl:    pantoprazole (PROTONIX) 40 MG tablet, Take 1 tablet (40 mg total) by mouth 2 (two) times daily., Disp: , Rfl:    polyethylene glycol (MIRALAX) 17 g packet, Take 17 g by mouth daily., Disp: 14 each, Rfl: 0   rosuvastatin (CRESTOR) 5 MG tablet, Take by mouth., Disp: , Rfl:    thiamine 100 MG tablet, Take 1 tablet (100 mg total) by mouth daily., Disp: , Rfl:    Review of Systems  Constitutional:  Negative for chills and fever.  HENT:  Negative for congestion and sore throat.   Eyes:  Negative for photophobia.  Respiratory:  Negative for cough, shortness of breath and wheezing.   Cardiovascular:  Negative for chest pain, palpitations and leg swelling.  Gastrointestinal:  Positive for abdominal pain. Negative for blood in stool, constipation, diarrhea, nausea and vomiting.  Genitourinary:  Negative for dysuria, flank pain and hematuria.  Musculoskeletal:  Negative for back pain and myalgias.  Skin:  Negative for rash.  Neurological:  Negative for dizziness, weakness and headaches.  Hematological:  Does not bruise/bleed easily.  Psychiatric/Behavioral:  Negative for suicidal ideas.        Objective:   Physical Exam Constitutional:      Appearance: He is well-developed.  HENT:     Head: Normocephalic and atraumatic.  Eyes:     Conjunctiva/sclera: Conjunctivae normal.   Cardiovascular:     Rate and Rhythm: Normal rate and regular rhythm.  Pulmonary:     Effort: Pulmonary effort is normal. No respiratory distress.     Breath sounds: No wheezing.  Abdominal:     General: There is no distension.     Palpations: Abdomen is soft.  Musculoskeletal:        General: No tenderness. Normal range of motion.     Cervical back: Normal range of motion and neck supple.  Skin:    General: Skin is warm and dry.     Coloration: Skin is not pale.  Findings: No erythema or rash.  Neurological:     Mental Status: He is alert and oriented to person, place, and time.  Psychiatric:        Mood and Affect: Mood normal.        Thought Content: Thought content normal.        Judgment: Judgment normal.   Weakness on right side        Assessment & Plan:   .HIV disease:  I will add order HIV viral load CD4 count CBC with differential CMP, RPR GC and chlamydia and I will continue  Marrion Coy,   prescription   GIB: ressolved  CVA: emboolic and on apixaban.  Coronary disease will continue on Crestor  Underweight okay for him to have supplementation with protein shakes but these need to be spaced out from his BIKTARVY or else BIKTARVY should be given with a meal   I have personally spent 42 minutes involved in face-to-face and non-face-to-face activities for this patient on the day of the visit. Professional time spent includes the following activities: Preparing to see the patient (review of tests), Obtaining and/or reviewing separately obtained history (admission/discharge record), Performing a medically appropriate examination and/or evaluation , Ordering medications/tests/procedures, referring and communicating with other health care professionals, Documenting clinical information in the EMR, Independently interpreting results (not separately reported), Communicating results to the patient/family/caregiver, Counseling and educating the  patient/family/caregiver and Care coordination (not separately reported).

## 2022-08-11 LAB — T-HELPER CELLS (CD4) COUNT (NOT AT ARMC)
Absolute CD4: 670 cells/uL (ref 490–1740)
Total lymphocyte count: 1330 cells/uL (ref 850–3900)

## 2022-08-12 LAB — T-HELPER CELLS (CD4) COUNT (NOT AT ARMC): CD4 T Helper %: 50 % (ref 30–61)

## 2022-08-12 LAB — CBC WITH DIFFERENTIAL/PLATELET
Absolute Monocytes: 261 cells/uL (ref 200–950)
Basophils Absolute: 20 cells/uL (ref 0–200)
Basophils Relative: 0.6 %
Eosinophils Absolute: 122 cells/uL (ref 15–500)
Lymphs Abs: 1297 cells/uL (ref 850–3900)
MCH: 29.7 pg (ref 27.0–33.0)
MPV: 9.9 fL (ref 7.5–12.5)
Monocytes Relative: 7.9 %
Neutro Abs: 1601 cells/uL (ref 1500–7800)
RBC: 3.5 10*6/uL — ABNORMAL LOW (ref 4.20–5.80)
RDW: 13.7 % (ref 11.0–15.0)
Total Lymphocyte: 39.3 %

## 2022-08-12 LAB — RPR: RPR Ser Ql: NONREACTIVE

## 2022-08-12 LAB — COMPLETE METABOLIC PANEL WITH GFR
ALT: 10 U/L (ref 9–46)
AST: 14 U/L (ref 10–35)
Albumin: 4 g/dL (ref 3.6–5.1)
Alkaline phosphatase (APISO): 61 U/L (ref 35–144)
BUN: 11 mg/dL (ref 7–25)
CO2: 26 mmol/L (ref 20–32)
Calcium: 9.6 mg/dL (ref 8.6–10.3)
Creat: 0.79 mg/dL (ref 0.70–1.28)
Globulin: 3.1 g/dL (calc) (ref 1.9–3.7)
Potassium: 3.8 mmol/L (ref 3.5–5.3)
Sodium: 141 mmol/L (ref 135–146)
Total Bilirubin: 0.6 mg/dL (ref 0.2–1.2)
Total Protein: 7.1 g/dL (ref 6.1–8.1)

## 2022-08-12 LAB — HIV-1 RNA QUANT-NO REFLEX-BLD
HIV 1 RNA Quant: 25 Copies/mL — ABNORMAL HIGH
HIV-1 RNA Quant, Log: 1.39 Log cps/mL — ABNORMAL HIGH

## 2022-08-12 LAB — LIPID PANEL
Cholesterol: 156 mg/dL (ref <200)
Triglycerides: 81 mg/dL (ref <150)

## 2022-08-22 ENCOUNTER — Encounter: Payer: Self-pay | Admitting: Gastroenterology

## 2022-08-22 ENCOUNTER — Ambulatory Visit (INDEPENDENT_AMBULATORY_CARE_PROVIDER_SITE_OTHER): Payer: Medicare (Managed Care) | Admitting: Gastroenterology

## 2022-08-22 VITALS — BP 156/94 | HR 79 | Temp 97.4°F | Ht 75.0 in | Wt 156.4 lb

## 2022-08-22 DIAGNOSIS — K921 Melena: Secondary | ICD-10-CM

## 2022-08-22 NOTE — Patient Instructions (Signed)
Update CBC in six weeks. Complete stool ifobt and return to our office.

## 2022-08-22 NOTE — Progress Notes (Signed)
GI Office Note    Referring Provider: No ref. provider found Primary Care Physician:  Patient, No Pcp Per  Primary Gastroenterologist: Dr. Levon Hedger  Chief Complaint   Chief Complaint  Patient presents with   hospitalization followup    History of Present Illness   Anthony Ferguson is a 72 y.o. male presenting today for hospital follow up.   History of HIV (followed by Dr. Orvan Falconer with infectious disease), hepatitis C treated with Epclusa with documented SVR 2019, stroke, prior cocaine abuse, prior history of alcohol use but patient cannot quantify, hypertension, diabetes, CAD status post CABG, Afib chronically anticoagulated with Eliquis presenting hospital back in May 2024 with abdominal pain, nausea/vomiting, melena/hematemesis during recent admission.  CT abdomen pelvis with contrast 06/2022 showed moderate fluid distention of the stomach with fluid distended distal esophagus, no dilated small bowel to suggest small bowel obstruction. Moderate feces in the distal colon with large retained feces at the rectum, rectal distention up to 9.1 cm.   EGD 06/2022: -gastric tube in esophagus -friability and shallow gastric ulcerations noted in stomach -gastritis, reactive gastropathy, no h.pylori -plans to repeat EGD in 4-6 weeks.   Labs from 08/10/22 with hemoglobin of 10.4, up from 9 during hospitalization.  Platelets 194,000 up from 132,000.  Creatinine 0.79, BUN 11, albumin 4, total bilirubin 0.6, alk phos 61, AST 14, ALT 10.  HIV-1 RNA quant of 25 (previously could not be detected).  Today: patient without complaints. Denies abdominal pain. No vomiting. States BMs regular. Denies melena, brbpr.    Medications   Current Outpatient Medications  Medication Sig Dispense Refill   apixaban (ELIQUIS) 5 MG TABS tablet Take 1 tablet (5 mg total) by mouth 2 (two) times daily. 60 tablet 0   bictegravir-emtricitabine-tenofovir AF (BIKTARVY) 50-200-25 MG TABS tablet Take 1 tablet by  mouth daily. 30 tablet 11   chlorhexidine (PERIDEX) 0.12 % solution 15 mLs by Mouth Rinse route 2 (two) times daily. 120 mL 0   cholecalciferol (VITAMIN D3) 25 MCG (1000 UNIT) tablet Take 1,000 Units by mouth daily.     ferrous sulfate 325 (65 FE) MG EC tablet Take 325 mg by mouth daily with breakfast.     folic acid (FOLVITE) 1 MG tablet Take 1 tablet (1 mg total) by mouth daily.     gabapentin (NEURONTIN) 300 MG capsule Take 600 mg by mouth 3 (three) times daily.     glipiZIDE (GLUCOTROL) 5 MG tablet Take 1 tablet (5 mg total) by mouth daily before breakfast.     Glucagon, rDNA, (GLUCAGON EMERGENCY) 1 MG KIT Inject into the muscle.     glucose blood test strip Use as instructed 100 each 12   HYDROcodone-acetaminophen (NORCO/VICODIN) 5-325 MG tablet Take 1 tablet by mouth 4 (four) times daily.     omega-3 acid ethyl esters (LOVAZA) 1 g capsule Take 1 capsule by mouth 2 (two) times daily.     pantoprazole (PROTONIX) 40 MG tablet Take 1 tablet (40 mg total) by mouth 2 (two) times daily.     polyethylene glycol (MIRALAX) 17 g packet Take 17 g by mouth daily. 14 each 0   rosuvastatin (CRESTOR) 5 MG tablet Take by mouth.     sennosides-docusate sodium (SENOKOT-S) 8.6-50 MG tablet Take 1 tablet by mouth daily.     thiamine 100 MG tablet Take 1 tablet (100 mg total) by mouth daily.     No current facility-administered medications for this visit.    Allergies   Allergies  as of 08/22/2022 - Review Complete 08/22/2022  Allergen Reaction Noted   Hctz [hydrochlorothiazide] Other (See Comments) 11/02/2014   Pork-derived products Palpitations and Hypertension 07/23/2016         Past Medical History:  Diagnosis Date   HIV (human immunodeficiency virus infection) (HCC)    Hypercholesteremia    Hypertension    Type 2 diabetes mellitus (HCC)    Past Surgical History:  Procedure Laterality Date   BIOPSY  07/14/2022   Procedure: BIOPSY;  Surgeon: [name], DO;  [facility];   Service: Endoscopy;;   CARDIAC SURGERY     CIRCUMCISION     CORONARY ARTERY BYPASS GRAFT     ESOPHAGOGASTRODUODENOSCOPY (EGD) WITH PROPOFOL N/A 07/14/2022   Procedure: ESOPHAGOGASTRODUODENOSCOPY (EGD) WITH PROPOFOL;  Surgeon: [name], DO;  [facility];  Service: Endoscopy;  Laterality: N/A;   IR CT HEAD LTD  05/20/2021   IR PERCUTANEOUS ART THROMBECTOMY/INFUSION INTRACRANIAL INC DIAG ANGIO  05/20/2021   RADIOLOGY WITH ANESTHESIA N/A 05/19/2021   Procedure: IR WITH ANESTHESIA;  Surgeon: Julieanne Cotton, MD;  Location: [facility];  Service: Radiology;  Laterality: N/A;   Family History  Problem Relation Age of Onset   Diabetes Mother    Heart failure Mother    Social History   Tobacco Use   Smoking status: Former    Packs/day: .3    Types: Cigarettes   Smokeless tobacco: Never   Tobacco comments:    Trying to slow down  Vaping Use   Vaping Use: Never used  Substance Use Topics   Alcohol use: Yes    Alcohol/week: 5.0 standard drinks of alcohol    Types: 5 Cans of beer per week    Comment: occas   Drug use: Not Currently    Types: Cocaine    Comment: last use 07/22/16     Review of Systems   General: Negative for anorexia, weight loss, fever, chills, fatigue, weakness. ENT: Negative for hoarseness, difficulty swallowing , nasal congestion. CV: Negative for chest pain, angina, palpitations, dyspnea on exertion, peripheral edema.  Respiratory: Negative for dyspnea at rest, dyspnea on exertion, cough, sputum, wheezing.  GI: See history of present illness. GU:  Negative for dysuria, hematuria, urinary incontinence, urinary frequency, nocturnal urination.  Endo: Negative for unusual weight change.     Physical Exam   BP (!) 156/94 (BP Location: Left Arm, Patient Position: Sitting, Cuff Size: Normal)   Pulse 79   Temp (!) 97.4 F (36.3 C) (Oral)   Ht 6\' 3"  (1.905 m)   Wt 156 lb 6.4 oz (70.9 kg) Comment: facility reported 08/09/22  SpO2 98%   BMI 19.55 kg/m     General: Well-nourished, well-developed in no acute distress.  Eyes: No icterus. Mouth: Oropharyngeal mucosa moist and pink  Lungs: Clear to auscultation bilaterally.  Heart: Regular rate and rhythm, no murmurs rubs or gallops.  Abdomen: Bowel sounds are normal, nontender, nondistended, no hepatosplenomegaly or masses,  no abdominal bruits or hernia , no rebound or guarding.  Rectal: not performed  Extremities: No lower extremity edema. No clubbing or deformities. Neuro: Alert and oriented x 4   Skin: Warm and dry, no jaundice.   Psych: Alert and cooperative, normal mood and affect.  Labs   Lab Results  Component Value Date   CREATININE 0.79 08/10/2022   BUN 11 08/10/2022   NA 141 08/10/2022   K 3.8 08/10/2022   CL 105 08/10/2022   CO2 26 08/10/2022  Lab Results  Component Value Date   WBC 3.3 (L) 08/10/2022   HGB 10.4 (L) 08/10/2022   HCT 32.1 (L) 08/10/2022   MCV 91.7 08/10/2022   PLT 194 08/10/2022     No results found for: "VITAMINB12" No results found for: "FOLATE" Lab Results  Component Value Date   ALT 10 08/10/2022   AST 14 08/10/2022   GGT 61 08/10/2022   ALKPHOS 45 07/13/2022   BILITOT 0.6 08/10/2022    Imaging Studies   No results found.  Assessment   Melena/hematemesis: recent hospitalization as outlined. EGD 07/14/22 showed hemorrhagic gastritis, superficial ulceration, ?component of NGT trauma. Biopsies unremarkable. Plans for repeat EGD in 4-6 weeks. Clinically patient doing better. Hgb is up some. No recurrent n/v, melena.   Constipation: resolved.   PLAN   Update CBC in six weeks.  Plan for repeat EGD per plan outlined during recent hospitalization. ASA 3.  I have discussed the risks, alternatives, benefits with regards to but not limited to the risk of reaction to medication, bleeding, infection, perforation and the patient is agreeable to proceed. Written consent to be obtained. Hold Eliquis 48 hours.    Leanna Battles. Melvyn Neth, MHS,  PA-C [facility]  I have reviewed the note and agree with the APP's assessment as described in this progress note  Katrinka Blazing, MD Gastroenterology and Hepatology [facility]

## 2022-08-23 ENCOUNTER — Ambulatory Visit: Payer: Medicare (Managed Care) | Admitting: Gastroenterology

## 2022-08-23 ENCOUNTER — Other Ambulatory Visit: Payer: Self-pay

## 2022-08-23 DIAGNOSIS — K921 Melena: Secondary | ICD-10-CM

## 2022-08-23 LAB — IFOBT (OCCULT BLOOD): IFOBT: NEGATIVE

## 2022-08-26 ENCOUNTER — Encounter: Payer: Self-pay | Admitting: Gastroenterology

## 2022-12-14 ENCOUNTER — Ambulatory Visit: Payer: Medicare (Managed Care) | Admitting: Infectious Disease

## 2023-01-08 ENCOUNTER — Encounter: Payer: Self-pay | Admitting: Infectious Disease

## 2023-01-08 DIAGNOSIS — Z7185 Encounter for immunization safety counseling: Secondary | ICD-10-CM

## 2023-01-08 HISTORY — DX: Encounter for immunization safety counseling: Z71.85

## 2023-01-09 ENCOUNTER — Ambulatory Visit: Payer: Medicare (Managed Care) | Admitting: Infectious Disease

## 2023-01-09 DIAGNOSIS — Z7185 Encounter for immunization safety counseling: Secondary | ICD-10-CM

## 2023-01-09 DIAGNOSIS — E1165 Type 2 diabetes mellitus with hyperglycemia: Secondary | ICD-10-CM

## 2023-01-09 DIAGNOSIS — K2971 Gastritis, unspecified, with bleeding: Secondary | ICD-10-CM

## 2023-01-09 DIAGNOSIS — B20 Human immunodeficiency virus [HIV] disease: Secondary | ICD-10-CM

## 2023-01-09 DIAGNOSIS — K219 Gastro-esophageal reflux disease without esophagitis: Secondary | ICD-10-CM

## 2023-01-09 DIAGNOSIS — E785 Hyperlipidemia, unspecified: Secondary | ICD-10-CM

## 2023-02-01 ENCOUNTER — Other Ambulatory Visit: Payer: Self-pay | Admitting: Infectious Disease

## 2023-02-01 ENCOUNTER — Other Ambulatory Visit: Payer: Self-pay

## 2023-02-01 ENCOUNTER — Ambulatory Visit (INDEPENDENT_AMBULATORY_CARE_PROVIDER_SITE_OTHER): Payer: Medicare (Managed Care) | Admitting: Infectious Disease

## 2023-02-01 VITALS — BP 144/88 | HR 82 | Resp 16 | Ht 75.0 in

## 2023-02-01 DIAGNOSIS — E785 Hyperlipidemia, unspecified: Secondary | ICD-10-CM | POA: Diagnosis not present

## 2023-02-01 DIAGNOSIS — K2971 Gastritis, unspecified, with bleeding: Secondary | ICD-10-CM

## 2023-02-01 DIAGNOSIS — E1165 Type 2 diabetes mellitus with hyperglycemia: Secondary | ICD-10-CM

## 2023-02-01 DIAGNOSIS — B20 Human immunodeficiency virus [HIV] disease: Secondary | ICD-10-CM

## 2023-02-01 DIAGNOSIS — I10 Essential (primary) hypertension: Secondary | ICD-10-CM

## 2023-02-01 DIAGNOSIS — I6602 Occlusion and stenosis of left middle cerebral artery: Secondary | ICD-10-CM

## 2023-02-01 DIAGNOSIS — Z7185 Encounter for immunization safety counseling: Secondary | ICD-10-CM

## 2023-02-01 MED ORDER — BICTEGRAVIR-EMTRICITAB-TENOFOV 50-200-25 MG PO TABS: 1.0000 | ORAL_TABLET | Freq: Every day | ORAL | 11 refills | Status: DC

## 2023-02-01 NOTE — Progress Notes (Signed)
Chief complaint follow-up for HIV disease on medications Subjective:    Patient ID: Anthony Ferguson, male    DOB: 12-Dec-1950, 72 y.o.   MRN: [id number]  HPI  Discussed the use of AI scribe software for clinical note transcription with the patient, who gave verbal consent to proceed.  History of Present Illness   The patient, with a history of stroke, hypertension, diabetes, hyperlipidemia, HIV, and neuropathy, presents for a routine follow-up. They reside in a facility due to the stroke they had a couple of years ago. They are on amlodipine for hypertension, glipizide for diabetes, Crestor for hyperlipidemia, thiamine as a vitamin supplement, apixaban as a blood thinner post-stroke, gabapentin for neuropathy, and Biktarvy for HIV. They had a GI bleed in the past, but deny any recent episodes of bleeding or tarry stools. Their HIV is well controlled with a viral load of twenty five and a CD4 count of six seventy. Kidney and liver function are normal. They are unsure if they have received their COVID and flu vaccines.       Past Medical History:  Diagnosis Date   A-fib [facility])    CAD (coronary artery disease)    HIV (human immunodeficiency virus infection) (HCC)    Hypercholesteremia    Hypertension    Type 2 diabetes mellitus (HCC)    Vaccine counseling 01/08/2023    Past Surgical History:  Procedure Laterality Date   BIOPSY  07/14/2022   Procedure: BIOPSY;  Surgeon: [name], DO;  [facility];  Service: Endoscopy;;   CARDIAC SURGERY     CIRCUMCISION     CORONARY ARTERY BYPASS GRAFT     ESOPHAGOGASTRODUODENOSCOPY (EGD) WITH PROPOFOL N/A 07/14/2022   Procedure: ESOPHAGOGASTRODUODENOSCOPY (EGD) WITH PROPOFOL;  Surgeon: [name], DO;  [facility];  Service: Endoscopy;  Laterality: N/A;   IR CT HEAD LTD  05/20/2021   IR PERCUTANEOUS ART THROMBECTOMY/INFUSION INTRACRANIAL INC DIAG ANGIO  05/20/2021   RADIOLOGY WITH ANESTHESIA N/A 05/19/2021    Procedure: IR WITH ANESTHESIA;  Surgeon: Julieanne Cotton, MD;  Location: [facility];  Service: Radiology;  Laterality: N/A;    Family History  Problem Relation Age of Onset   Diabetes Mother    Heart failure Mother       Social History   Socioeconomic History   Marital status: Legally Separated    Spouse name: Not on file   Number of children: Not on file   Years of education: Not on file   Highest education level: Not on file  Occupational History   Not on file  Tobacco Use   Smoking status: Former    Current packs/day: 0.30    Types: Cigarettes   Smokeless tobacco: Never   Tobacco comments:    Trying to slow down  Vaping Use   Vaping status: Never Used  Substance and Sexual Activity   Alcohol use: Yes    Alcohol/week: 5.0 standard drinks of alcohol    Types: 5 Cans of beer per week    Comment: occas   Drug use: Not Currently    Types: Cocaine    Comment: last use 07/22/16   Sexual activity: Not Currently    Partners: Female    Comment: condoms declined  Other Topics Concern   Not on file  Social History Narrative   Not on file   Social Drivers of Health   Financial Resource Strain: Not on file  Food Insecurity: No Food Insecurity (07/15/2022)   Hunger Vital  Sign    Worried About Programme researcher, broadcasting/film/video in the Last Year: Never true    Ran Out of Food in the Last Year: Never true  Transportation Needs: No Transportation Needs (07/15/2022)   PRAPARE - Administrator, Civil Service (Medical): No    Lack of Transportation (Non-Medical): No  Physical Activity: Not on file  Stress: Not on file  Social Connections: Not on file    Allergies  Allergen Reactions   Hctz [Hydrochlorothiazide] Other (See Comments)    "ran my sugar up"   Pork-Derived Products Palpitations and Hypertension     Current Outpatient Medications:    amLODipine (NORVASC) 10 MG tablet, SMARTSIG:1.0 Tablet(s) By Mouth Daily, Disp: , Rfl:    apixaban (ELIQUIS) 5 MG TABS tablet, Take 1  tablet (5 mg total) by mouth 2 (two) times daily., Disp: 60 tablet, Rfl: 0   bictegravir-emtricitabine-tenofovir AF (BIKTARVY) 50-200-25 MG TABS tablet, Take 1 tablet by mouth daily., Disp: 30 tablet, Rfl: 11   chlorhexidine (PERIDEX) 0.12 % solution, 15 mLs by Mouth Rinse route 2 (two) times daily., Disp: 120 mL, Rfl: 0   cholecalciferol (VITAMIN D3) 25 MCG (1000 UNIT) tablet, Take 1,000 Units by mouth daily., Disp: , Rfl:    ferrous sulfate 325 (65 FE) MG EC tablet, Take 325 mg by mouth daily with breakfast., Disp: , Rfl:    folic acid (FOLVITE) 1 MG tablet, Take 1 tablet (1 mg total) by mouth daily., Disp: , Rfl:    gabapentin (NEURONTIN) 300 MG capsule, Take 600 mg by mouth 3 (three) times daily., Disp: , Rfl:    glipiZIDE (GLUCOTROL) 5 MG tablet, Take 1 tablet (5 mg total) by mouth daily before breakfast., Disp: , Rfl:    Glucagon, rDNA, (GLUCAGON EMERGENCY) 1 MG KIT, Inject into the muscle., Disp: , Rfl:    glucose blood test strip, Use as instructed, Disp: 100 each, Rfl: 12   HYDROcodone-acetaminophen (NORCO/VICODIN) 5-325 MG tablet, Take 1 tablet by mouth 4 (four) times daily., Disp: , Rfl:    omega-3 acid ethyl esters (LOVAZA) 1 g capsule, Take 1 capsule by mouth 2 (two) times daily., Disp: , Rfl:    pantoprazole (PROTONIX) 40 MG tablet, Take 1 tablet (40 mg total) by mouth 2 (two) times daily., Disp: , Rfl:    polyethylene glycol (MIRALAX) 17 g packet, Take 17 g by mouth daily., Disp: 14 each, Rfl: 0   rosuvastatin (CRESTOR) 5 MG tablet, Take by mouth., Disp: , Rfl:    sennosides-docusate sodium (SENOKOT-S) 8.6-50 MG tablet, Take 1 tablet by mouth daily., Disp: , Rfl:    thiamine 100 MG tablet, Take 1 tablet (100 mg total) by mouth daily., Disp: , Rfl:     Review of Systems  Constitutional:  Negative for activity change, appetite change, chills, diaphoresis, fatigue, fever and unexpected weight change.  HENT:  Negative for congestion, rhinorrhea, sinus pressure, sneezing, sore throat  and trouble swallowing.   Eyes:  Negative for photophobia and visual disturbance.  Respiratory:  Negative for cough, chest tightness, shortness of breath, wheezing and stridor.   Cardiovascular:  Negative for chest pain, palpitations and leg swelling.  Gastrointestinal:  Negative for abdominal distention, abdominal pain, anal bleeding, blood in stool, constipation, diarrhea, nausea and vomiting.  Genitourinary:  Negative for difficulty urinating, dysuria, flank pain and hematuria.  Musculoskeletal:  Negative for arthralgias, back pain, gait problem, joint swelling and myalgias.  Skin:  Negative for color change, pallor, rash and wound.  Neurological:  Negative for dizziness, tremors and light-headedness.  Hematological:  Negative for adenopathy. Does not bruise/bleed easily.  Psychiatric/Behavioral:  Negative for agitation, behavioral problems, confusion, decreased concentration, dysphoric mood and sleep disturbance.        Objective:   Physical Exam Constitutional:      Appearance: He is well-developed.  HENT:     Head: Normocephalic and atraumatic.  Eyes:     Conjunctiva/sclera: Conjunctivae normal.  Cardiovascular:     Rate and Rhythm: Normal rate and regular rhythm.  Pulmonary:     Effort: Pulmonary effort is normal. No respiratory distress.     Breath sounds: No wheezing.  Abdominal:     General: There is no distension.     Palpations: Abdomen is soft.  Musculoskeletal:        General: No tenderness. Normal range of motion.     Cervical back: Normal range of motion and neck supple.  Skin:    General: Skin is warm and dry.     Coloration: Skin is not pale.     Findings: No erythema or rash.  Neurological:     General: No focal deficit present.     Mental Status: He is alert and oriented to person, place, and time.  Psychiatric:        Mood and Affect: Mood normal.        Behavior: Behavior normal.        Thought Content: Thought content normal.        Judgment:  Judgment normal.           Assessment & Plan:   Assessment and Plan    HIV Viral load undetectable and CD4 count 670 on Biktarvy. -Continue Biktarvy. -Repeat labs today including VL, CD4  Hypertension On Amlodipine. -Continue Amlodipine.  Diabetes On Glipizide. -Continue Glipizide.  Hyperlipidemia On Crestor. -Continue Crestor.  Neuropathy On Gabapentin. -Continue Gabapentin.  History of Stroke On Apixaban. -Continue Apixaban.  History of GI bleed No recent episodes of bleeding or tarry stools. -Monitor for any signs of GI bleeding.  General Health Maintenance -Check if patient has received flu and COVID vaccines. Administer if not already given. -Plan for follow-up in approximately six months.

## 2023-02-02 LAB — T-HELPER CELLS (CD4) COUNT (NOT AT ARMC)
CD4 % Helper T Cell: 50 % (ref 33–65)
CD4 T Cell Abs: 567 /uL (ref 400–1790)

## 2023-02-04 LAB — CBC WITH DIFFERENTIAL/PLATELET
Absolute Lymphocytes: 1193 {cells}/uL (ref 850–3900)
Absolute Monocytes: 464 {cells}/uL (ref 200–950)
Basophils Absolute: 31 {cells}/uL (ref 0–200)
Basophils Relative: 0.8 %
Eosinophils Absolute: 82 {cells}/uL (ref 15–500)
Eosinophils Relative: 2.1 %
HCT: 37.9 % — ABNORMAL LOW (ref 38.5–50.0)
Hemoglobin: 12.6 g/dL — ABNORMAL LOW (ref 13.2–17.1)
MCH: 30.5 pg (ref 27.0–33.0)
MCHC: 33.2 g/dL (ref 32.0–36.0)
MCV: 91.8 fL (ref 80.0–100.0)
MPV: 10.6 fL (ref 7.5–12.5)
Monocytes Relative: 11.9 %
Neutro Abs: 2129 {cells}/uL (ref 1500–7800)
Neutrophils Relative %: 54.6 %
Platelets: 147 10*3/uL (ref 140–400)
RBC: 4.13 10*6/uL — ABNORMAL LOW (ref 4.20–5.80)
RDW: 13.1 % (ref 11.0–15.0)
Total Lymphocyte: 30.6 %
WBC: 3.9 10*3/uL (ref 3.8–10.8)

## 2023-02-04 LAB — HIV-1 RNA QUANT-NO REFLEX-BLD
HIV 1 RNA Quant: NOT DETECTED {copies}/mL
HIV-1 RNA Quant, Log: NOT DETECTED {Log}

## 2023-02-04 LAB — RPR: RPR Ser Ql: NONREACTIVE

## 2023-02-04 LAB — LIPID PANEL
Cholesterol: 136 mg/dL (ref <200)
HDL: 67 mg/dL (ref >=40)
LDL Cholesterol (Calc): 53 mg/dL
Non-HDL Cholesterol (Calc): 69 mg/dL (ref <130)
Total CHOL/HDL Ratio: 2 (calc) (ref <5.0)
Triglycerides: 84 mg/dL (ref <150)

## 2023-02-04 LAB — COMPLETE METABOLIC PANEL WITH GFR
AG Ratio: 1.3 (calc) (ref 1.0–2.5)
ALT: 10 U/L (ref 9–46)
AST: 13 U/L (ref 10–35)
Albumin: 4.5 g/dL (ref 3.6–5.1)
Alkaline phosphatase (APISO): 77 U/L (ref 35–144)
BUN: 15 mg/dL (ref 7–25)
CO2: 26 mmol/L (ref 20–32)
Calcium: 10.2 mg/dL (ref 8.6–10.3)
Chloride: 102 mmol/L (ref 98–110)
Creat: 0.84 mg/dL (ref 0.70–1.28)
Globulin: 3.5 g/dL (ref 1.9–3.7)
Glucose, Bld: 329 mg/dL — ABNORMAL HIGH (ref 65–99)
Potassium: 3.7 mmol/L (ref 3.5–5.3)
Sodium: 138 mmol/L (ref 135–146)
Total Bilirubin: 0.9 mg/dL (ref 0.2–1.2)
Total Protein: 8 g/dL (ref 6.1–8.1)
eGFR: 92 mL/min/{1.73_m2} (ref >=60)

## 2023-07-07 NOTE — Progress Notes (Deleted)
 Subjective:   Chief complaint: follow-up for HIV disease on medications   Patient ID: Anthony Ferguson, male    DOB: 1950-06-12, 73 y.o.   MRN: [id number]  HPI   Past Medical History:  Diagnosis Date   A-fib [facility]    CAD (coronary artery disease)    HIV (human immunodeficiency virus infection) (HCC)    Hypercholesteremia    Hypertension    Type 2 diabetes mellitus (HCC)    Vaccine counseling 01/08/2023    Past Surgical History:  Procedure Laterality Date   BIOPSY  07/14/2022   Procedure: BIOPSY;  Surgeon: Vinetta Greening, DO;  [facility];  Service: Endoscopy;;   CARDIAC SURGERY     CIRCUMCISION     CORONARY ARTERY BYPASS GRAFT     ESOPHAGOGASTRODUODENOSCOPY (EGD) WITH PROPOFOL  N/A 07/14/2022   Procedure: ESOPHAGOGASTRODUODENOSCOPY (EGD) WITH PROPOFOL ;  Surgeon: Vinetta Greening, DO;  [facility];  Service: Endoscopy;  Laterality: N/A;   IR CT HEAD LTD  05/20/2021   IR PERCUTANEOUS ART THROMBECTOMY/INFUSION INTRACRANIAL INC DIAG ANGIO  05/20/2021   RADIOLOGY WITH ANESTHESIA N/A 05/19/2021   Procedure: IR WITH ANESTHESIA;  Surgeon: Luellen Sages, MD;  Location[facility];  Service: Radiology;  Laterality: N/A;    Family History  Problem Relation Age of Onset   Diabetes Mother    Heart failure Mother       Social History   Socioeconomic History   Marital status: Legally Separated    Spouse name: Not on file   Number of children: Not on file   Years of education: Not on file   Highest education level: Not on file  Occupational History   Not on file  Tobacco Use   Smoking status: Former    Current packs/day: 0.30    Types: Cigarettes   Smokeless tobacco: Never   Tobacco comments:    Trying to slow down  Vaping Use   Vaping status: Never Used  Substance and Sexual Activity   Alcohol use: Yes    Alcohol/week: 5.0 standard drinks of alcohol    Types: 5 Cans of beer per week    Comment: occas   Drug use: Not Currently    Types:  Cocaine    Comment: last use 07/22/16   Sexual activity: Not Currently    Partners: Female    Comment: condoms declined  Other Topics Concern   Not on file  Social History Narrative   Not on file   Social Drivers of Health   Financial Resource Strain: Not on file  Food Insecurity: No Food Insecurity (07/15/2022)   Hunger Vital Sign    Worried About Running Out of Food in the Last Year: Never true    Ran Out of Food in the Last Year: Never true  Transportation Needs: No Transportation Needs (07/15/2022)   PRAPARE - Administrator, Civil Service (Medical): No    Lack of Transportation (Non-Medical): No  Physical Activity: Not on file  Stress: Not on file  Social Connections: Not on file    Allergies  Allergen Reactions   Hctz [Hydrochlorothiazide ] Other (See Comments)    "ran my sugar up"   Pork-Derived Products Palpitations and Hypertension     Current Outpatient Medications:    amLODipine  (NORVASC ) 10 MG tablet, SMARTSIG:1.0 Tablet(s) By Mouth Daily, Disp: , Rfl:    apixaban  (ELIQUIS ) 5 MG TABS tablet, Take 1 tablet (5 mg total) by mouth 2 (two) times daily., Disp: 60 tablet,  Rfl: 0   bictegravir-emtricitabine -tenofovir  AF (BIKTARVY ) 50-200-25 MG TABS tablet, Take 1 tablet by mouth daily., Disp: 30 tablet, Rfl: 11   chlorhexidine  (PERIDEX ) 0.12 % solution, 15 mLs by Mouth Rinse route 2 (two) times daily., Disp: 120 mL, Rfl: 0   cholecalciferol (VITAMIN D3) 25 MCG (1000 UNIT) tablet, Take 1,000 Units by mouth daily., Disp: , Rfl:    ferrous sulfate 325 (65 FE) MG EC tablet, Take 325 mg by mouth daily with breakfast., Disp: , Rfl:    folic acid  (FOLVITE ) 1 MG tablet, Take 1 tablet (1 mg total) by mouth daily., Disp: , Rfl:    gabapentin (NEURONTIN) 300 MG capsule, Take 600 mg by mouth 3 (three) times daily., Disp: , Rfl:    glipiZIDE  (GLUCOTROL ) 5 MG tablet, Take 1 tablet (5 mg total) by mouth daily before breakfast., Disp: , Rfl:    Glucagon, rDNA, (GLUCAGON  EMERGENCY) 1 MG KIT, Inject into the muscle., Disp: , Rfl:    glucose blood test strip, Use as instructed, Disp: 100 each, Rfl: 12   HYDROcodone -acetaminophen  (NORCO/VICODIN) 5-325 MG tablet, Take 1 tablet by mouth 4 (four) times daily., Disp: , Rfl:    omega-3 acid ethyl esters (LOVAZA) 1 g capsule, Take 1 capsule by mouth 2 (two) times daily., Disp: , Rfl:    pantoprazole  (PROTONIX ) 40 MG tablet, Take 1 tablet (40 mg total) by mouth 2 (two) times daily., Disp: , Rfl:    polyethylene glycol (MIRALAX ) 17 g packet, Take 17 g by mouth daily., Disp: 14 each, Rfl: 0   rosuvastatin (CRESTOR) 5 MG tablet, Take by mouth., Disp: , Rfl:    sennosides-docusate sodium  (SENOKOT-S) 8.6-50 MG tablet, Take 1 tablet by mouth daily., Disp: , Rfl:    thiamine  100 MG tablet, Take 1 tablet (100 mg total) by mouth daily., Disp: , Rfl:    Review of Systems     Objective:   Physical Exam        Assessment & Plan:

## 2023-07-09 ENCOUNTER — Ambulatory Visit: Payer: Self-pay | Admitting: Infectious Disease

## 2023-07-09 DIAGNOSIS — I63512 Cerebral infarction due to unspecified occlusion or stenosis of left middle cerebral artery: Secondary | ICD-10-CM

## 2023-07-09 DIAGNOSIS — B20 Human immunodeficiency virus [HIV] disease: Secondary | ICD-10-CM

## 2023-07-09 DIAGNOSIS — E1165 Type 2 diabetes mellitus with hyperglycemia: Secondary | ICD-10-CM

## 2023-07-09 DIAGNOSIS — E785 Hyperlipidemia, unspecified: Secondary | ICD-10-CM

## 2023-07-20 NOTE — Progress Notes (Signed)
 The ASCVD Risk score (Arnett DK, et al., 2019) failed to calculate for the following reasons:   Risk score cannot be calculated because patient has a medical history suggesting prior/existing ASCVD  Arlon Bergamo, BSN, RN

## 2023-09-10 NOTE — Progress Notes (Signed)
 Chief complaint: follow-up for HIV disease on medications  Subjective:    Patient ID: Anthony Ferguson, male    DOB: [birth year], 73 y.o.   MRN: [id number]  HPI  Discussed the use of AI scribe software for clinical note transcription with the patient, who gave verbal consent to proceed.  History of Present Illness   Anthony Ferguson is a 73 year old male with HIV who presents for follow-up care.  He resides in a nursing home and has mobility limitations due to a previous stroke. He is living with HIV and is prescribed Biktarvy , which he takes with food to avoid interactions with supplements like iron. His medication regimen includes amlodipine , metformin, Crestor, gabapentin, Eliquis , torsemide, sertraline, vitamin D, iron sulfate, folic acid , melatonin, Miralax , thiamine , hydrocodone , glucagon, Protonix , and senna. Monitoring for potential drug interactions and adherence to his antiretroviral therapy is crucial.       Past Medical History:  Diagnosis Date   A-fib [facility])    CAD (coronary artery disease)    HIV (human immunodeficiency virus infection) (HCC)    Hypercholesteremia    Hypertension    Type 2 diabetes mellitus (HCC)    Vaccine counseling 01/08/2023    Past Surgical History:  Procedure Laterality Date   BIOPSY  07/14/2022   Procedure: BIOPSY;  Surgeon: Cindie Carlin POUR, DO;  [facility];  Service: Endoscopy;;   CARDIAC SURGERY     CIRCUMCISION     CORONARY ARTERY BYPASS GRAFT     ESOPHAGOGASTRODUODENOSCOPY (EGD) WITH PROPOFOL  N/A 07/14/2022   Procedure: ESOPHAGOGASTRODUODENOSCOPY (EGD) WITH PROPOFOL ;  Surgeon: Cindie Carlin POUR, DO;  [facility];  Service: Endoscopy;  Laterality: N/A;   IR CT HEAD LTD  05/20/2021   IR PERCUTANEOUS ART THROMBECTOMY/INFUSION INTRACRANIAL INC DIAG ANGIO  05/20/2021   RADIOLOGY WITH ANESTHESIA N/A 05/19/2021   Procedure: IR WITH ANESTHESIA;  Surgeon: Dolphus Carrion, MD;  Location: [facility] OR;  Service: Radiology;   Laterality: N/A;    Family History  Problem Relation Age of Onset   Diabetes Mother    Heart failure Mother       Social History   Socioeconomic History   Marital status: Legally Separated    Spouse name: Not on file   Number of children: Not on file   Years of education: Not on file   Highest education level: Not on file  Occupational History   Not on file  Tobacco Use   Smoking status: Former    Current packs/day: 0.30    Types: Cigarettes   Smokeless tobacco: Never   Tobacco comments:    Trying to slow down  Vaping Use   Vaping status: Never Used  Substance and Sexual Activity   Alcohol use: Yes    Alcohol/week: 5.0 standard drinks of alcohol    Types: 5 Cans of beer per week    Comment: occas   Drug use: Not Currently    Types: Cocaine    Comment: last use 07/22/16   Sexual activity: Not Currently    Partners: Female    Comment: condoms declined  Other Topics Concern   Not on file  Social History Narrative   Not on file   Social Drivers of Health   Financial Resource Strain: Not on file  Food Insecurity: No Food Insecurity (07/15/2022)   Hunger Vital Sign    Worried About Running Out of Food in the Last Year: Never true    Ran Out of Food in the Last  Year: Never true  Transportation Needs: No Transportation Needs (07/15/2022)   PRAPARE - Administrator, Civil Service (Medical): No    Lack of Transportation (Non-Medical): No  Physical Activity: Not on file  Stress: Not on file  Social Connections: Not on file    Allergies  Allergen Reactions   Hctz [Hydrochlorothiazide ] Other (See Comments)    ran my sugar up   Pork-Derived Products Palpitations and Hypertension     Current Outpatient Medications:    amLODipine  (NORVASC ) 10 MG tablet, SMARTSIG:1.0 Tablet(s) By Mouth Daily, Disp: , Rfl:    apixaban  (ELIQUIS ) 5 MG TABS tablet, Take 1 tablet (5 mg total) by mouth 2 (two) times daily., Disp: 60 tablet, Rfl: 0    bictegravir-emtricitabine -tenofovir  AF (BIKTARVY ) 50-200-25 MG TABS tablet, Take 1 tablet by mouth daily., Disp: 30 tablet, Rfl: 11   chlorhexidine  (PERIDEX ) 0.12 % solution, 15 mLs by Mouth Rinse route 2 (two) times daily., Disp: 120 mL, Rfl: 0   cholecalciferol (VITAMIN D3) 25 MCG (1000 UNIT) tablet, Take 1,000 Units by mouth daily., Disp: , Rfl:    ferrous sulfate 325 (65 FE) MG EC tablet, Take 325 mg by mouth daily with breakfast., Disp: , Rfl:    folic acid  (FOLVITE ) 1 MG tablet, Take 1 tablet (1 mg total) by mouth daily., Disp: , Rfl:    gabapentin (NEURONTIN) 300 MG capsule, Take 600 mg by mouth 3 (three) times daily., Disp: , Rfl:    glipiZIDE  (GLUCOTROL ) 5 MG tablet, Take 1 tablet (5 mg total) by mouth daily before breakfast., Disp: , Rfl:    Glucagon, rDNA, (GLUCAGON EMERGENCY) 1 MG KIT, Inject into the muscle., Disp: , Rfl:    glucose blood test strip, Use as instructed, Disp: 100 each, Rfl: 12   HYDROcodone -acetaminophen  (NORCO/VICODIN) 5-325 MG tablet, Take 1 tablet by mouth 4 (four) times daily., Disp: , Rfl:    omega-3 acid ethyl esters (LOVAZA) 1 g capsule, Take 1 capsule by mouth 2 (two) times daily., Disp: , Rfl:    pantoprazole  (PROTONIX ) 40 MG tablet, Take 1 tablet (40 mg total) by mouth 2 (two) times daily., Disp: , Rfl:    polyethylene glycol (MIRALAX ) 17 g packet, Take 17 g by mouth daily., Disp: 14 each, Rfl: 0   rosuvastatin (CRESTOR) 5 MG tablet, Take by mouth., Disp: , Rfl:    sennosides-docusate sodium  (SENOKOT-S) 8.6-50 MG tablet, Take 1 tablet by mouth daily., Disp: , Rfl:    thiamine  100 MG tablet, Take 1 tablet (100 mg total) by mouth daily., Disp: , Rfl:    Review of Systems  Unable to perform ROS: Other       Objective:   Physical Exam Constitutional:      Appearance: He is well-developed.  HENT:     Head: Normocephalic and atraumatic.  Eyes:     Conjunctiva/sclera: Conjunctivae normal.  Cardiovascular:     Rate and Rhythm: Normal rate and regular  rhythm.  Pulmonary:     Effort: Pulmonary effort is normal. No respiratory distress.     Breath sounds: No wheezing.  Abdominal:     General: There is no distension.     Palpations: Abdomen is soft.  Musculoskeletal:        General: No tenderness. Normal range of motion.     Cervical back: Normal range of motion and neck supple.  Skin:    General: Skin is warm and dry.     Coloration: Skin is not pale.  Findings: No erythema or rash.  Neurological:     Mental Status: He is alert and oriented to person, place, and time.  Psychiatric:        Speech: Speech is delayed.        Behavior: Behavior normal.        Thought Content: Thought content normal.        Cognition and Memory: He exhibits impaired recent memory and impaired remote memory.        Judgment: Judgment normal.           Assessment & Plan:   Assessment and Plan    HIV infection HIV managed with Biktarvy . - Ensure Biktarvy  taken with food to avoid interaction with iron supplements. - Order blood work including HIV RNA, CD4 CMP CBC RPR  Sequelae of stroke Residual mobility issues post-stroke, requiring wheelchair use.  Type 2 diabetes mellitus Managed with metformin.  Neuropathy on gabapentin  Hypertension Managed with amlodipine .  Hyperlipidemia on crestor  hx of CVA on anti HTNsives and also Eliquis   Depression Managed with sertraline.      Hx of GI bleed: on protonix   Anemia: on Iron--note he needs to take this at different part of the day vs his Biktarvy  UNLESS HE ALSO EATS A MEAL WITH BIKTARVY  TO AVOID CHELATION OF BICTEGRAVIR BY IRON  Vaccine counseling: should have Shingrix

## 2023-09-11 ENCOUNTER — Other Ambulatory Visit: Payer: Self-pay

## 2023-09-11 ENCOUNTER — Ambulatory Visit (INDEPENDENT_AMBULATORY_CARE_PROVIDER_SITE_OTHER): Admitting: Infectious Disease

## 2023-09-11 ENCOUNTER — Encounter: Payer: Self-pay | Admitting: Infectious Disease

## 2023-09-11 ENCOUNTER — Other Ambulatory Visit (HOSPITAL_COMMUNITY)
Admission: RE | Admit: 2023-09-11 | Discharge: 2023-09-11 | Disposition: A | Discharge status: Home / Self Care | Source: Ambulatory Visit | Attending: Infectious Disease | Admitting: Infectious Disease

## 2023-09-11 VITALS — BP 131/81 | HR 68 | Temp 97.6°F

## 2023-09-11 DIAGNOSIS — E1165 Type 2 diabetes mellitus with hyperglycemia: Secondary | ICD-10-CM

## 2023-09-11 DIAGNOSIS — D62 Acute posthemorrhagic anemia: Secondary | ICD-10-CM | POA: Diagnosis not present

## 2023-09-11 DIAGNOSIS — K2971 Gastritis, unspecified, with bleeding: Secondary | ICD-10-CM

## 2023-09-11 DIAGNOSIS — E785 Hyperlipidemia, unspecified: Secondary | ICD-10-CM | POA: Diagnosis not present

## 2023-09-11 DIAGNOSIS — I639 Cerebral infarction, unspecified: Secondary | ICD-10-CM

## 2023-09-11 DIAGNOSIS — B20 Human immunodeficiency virus [HIV] disease: Secondary | ICD-10-CM

## 2023-09-11 DIAGNOSIS — G629 Polyneuropathy, unspecified: Secondary | ICD-10-CM

## 2023-09-11 DIAGNOSIS — Z7185 Encounter for immunization safety counseling: Secondary | ICD-10-CM

## 2023-09-11 DIAGNOSIS — I25119 Atherosclerotic heart disease of native coronary artery with unspecified angina pectoris: Secondary | ICD-10-CM

## 2023-09-11 DIAGNOSIS — D509 Iron deficiency anemia, unspecified: Secondary | ICD-10-CM

## 2023-09-11 MED ORDER — BICTEGRAVIR-EMTRICITAB-TENOFOV 50-200-25 MG PO TABS: 1.0000 | ORAL_TABLET | Freq: Every day | ORAL | 11 refills | Status: AC

## 2023-09-12 LAB — URINE CYTOLOGY ANCILLARY ONLY
Chlamydia: NEGATIVE
Comment: NEGATIVE
Comment: NORMAL
Neisseria Gonorrhea: NEGATIVE

## 2023-09-13 LAB — COMPLETE METABOLIC PANEL WITHOUT GFR
AG Ratio: 1.6 (calc) (ref 1.0–2.5)
ALT: 8 U/L — ABNORMAL LOW (ref 9–46)
AST: 16 U/L (ref 10–35)
Albumin: 4.9 g/dL (ref 3.6–5.1)
Alkaline phosphatase (APISO): 58 U/L (ref 35–144)
BUN: 17 mg/dL (ref 7–25)
CO2: 29 mmol/L (ref 20–32)
Calcium: 9.9 mg/dL (ref 8.6–10.3)
Chloride: 101 mmol/L (ref 98–110)
Creat: 1 mg/dL (ref 0.70–1.28)
Globulin: 3.1 g/dL (ref 1.9–3.7)
Glucose, Bld: 185 mg/dL — ABNORMAL HIGH (ref 65–99)
Potassium: 3.7 mmol/L (ref 3.5–5.3)
Sodium: 141 mmol/L (ref 135–146)
Total Bilirubin: 0.9 mg/dL (ref 0.2–1.2)
Total Protein: 8 g/dL (ref 6.1–8.1)

## 2023-09-13 LAB — CBC WITH DIFFERENTIAL/PLATELET
Absolute Lymphocytes: 1445 {cells}/uL (ref 850–3900)
Absolute Monocytes: 462 {cells}/uL (ref 200–950)
Basophils Absolute: 20 {cells}/uL (ref 0–200)
Basophils Relative: 0.6 %
Eosinophils Absolute: 59 {cells}/uL (ref 15–500)
Eosinophils Relative: 1.8 %
HCT: 33.5 % — ABNORMAL LOW (ref 38.5–50.0)
Hemoglobin: 11 g/dL — ABNORMAL LOW (ref 13.2–17.1)
MCH: 31.3 pg (ref 27.0–33.0)
MCHC: 32.8 g/dL (ref 32.0–36.0)
MCV: 95.2 fL (ref 80.0–100.0)
MPV: 11 fL (ref 7.5–12.5)
Monocytes Relative: 14 %
Neutro Abs: 1313 {cells}/uL — ABNORMAL LOW (ref 1500–7800)
Neutrophils Relative %: 39.8 %
Platelets: 156 Thousand/uL (ref 140–400)
RBC: 3.52 Million/uL — ABNORMAL LOW (ref 4.20–5.80)
RDW: 14.4 % (ref 11.0–15.0)
Total Lymphocyte: 43.8 %
WBC: 3.3 Thousand/uL — ABNORMAL LOW (ref 3.8–10.8)

## 2023-09-13 LAB — HIV-1 RNA QUANT-NO REFLEX-BLD
HIV 1 RNA Quant: NOT DETECTED {copies}/mL
HIV-1 RNA Quant, Log: NOT DETECTED {Log_copies}/mL

## 2023-09-13 LAB — RPR: RPR Ser Ql: NONREACTIVE

## 2023-09-13 LAB — LIPID PANEL
Cholesterol: 157 mg/dL (ref <200)
HDL: 71 mg/dL (ref >=40)
LDL Cholesterol (Calc): 68 mg/dL
Non-HDL Cholesterol (Calc): 86 mg/dL (ref <130)
Total CHOL/HDL Ratio: 2.2 (calc) (ref <5.0)
Triglycerides: 94 mg/dL (ref <150)

## 2023-09-13 LAB — T-HELPER CELLS (CD4) COUNT (NOT AT ARMC)
CD4 % Helper T Cell: 37 % (ref 33–65)
CD4 T Cell Abs: 506 /uL (ref 400–1790)

## 2024-02-21 ENCOUNTER — Encounter: Payer: Self-pay | Admitting: Gastroenterology

## 2024-03-11 ENCOUNTER — Ambulatory Visit: Admitting: Infectious Disease

## 2024-03-20 ENCOUNTER — Emergency Department (HOSPITAL_COMMUNITY)

## 2024-03-20 ENCOUNTER — Emergency Department (HOSPITAL_COMMUNITY)
Admission: EM | Admit: 2024-03-20 | Discharge: 2024-03-20 | Disposition: A | Discharge status: Home / Self Care | Attending: Emergency Medicine | Admitting: Emergency Medicine

## 2024-03-20 ENCOUNTER — Encounter (HOSPITAL_COMMUNITY): Payer: Self-pay

## 2024-03-20 ENCOUNTER — Other Ambulatory Visit: Payer: Self-pay

## 2024-03-20 DIAGNOSIS — Z21 Asymptomatic human immunodeficiency virus [HIV] infection status: Secondary | ICD-10-CM | POA: Diagnosis not present

## 2024-03-20 DIAGNOSIS — S0990XA Unspecified injury of head, initial encounter: Secondary | ICD-10-CM | POA: Insufficient documentation

## 2024-03-20 DIAGNOSIS — E119 Type 2 diabetes mellitus without complications: Secondary | ICD-10-CM | POA: Insufficient documentation

## 2024-03-20 DIAGNOSIS — Z87891 Personal history of nicotine dependence: Secondary | ICD-10-CM | POA: Insufficient documentation

## 2024-03-20 DIAGNOSIS — W06XXXA Fall from bed, initial encounter: Secondary | ICD-10-CM | POA: Diagnosis not present

## 2024-03-20 DIAGNOSIS — I10 Essential (primary) hypertension: Secondary | ICD-10-CM | POA: Insufficient documentation

## 2024-03-20 DIAGNOSIS — I2510 Atherosclerotic heart disease of native coronary artery without angina pectoris: Secondary | ICD-10-CM | POA: Diagnosis not present

## 2024-03-20 DIAGNOSIS — M542 Cervicalgia: Secondary | ICD-10-CM | POA: Insufficient documentation

## 2024-03-20 DIAGNOSIS — M545 Low back pain, unspecified: Secondary | ICD-10-CM | POA: Insufficient documentation

## 2024-03-20 DIAGNOSIS — W19XXXA Unspecified fall, initial encounter: Secondary | ICD-10-CM

## 2024-03-20 MED ORDER — ACETAMINOPHEN 500 MG PO TABS
1000.0000 mg | ORAL_TABLET | Freq: Once | ORAL | Status: AC
  Administered 2024-03-20: 1000 mg via ORAL
  Filled 2024-03-20: qty 2

## 2024-03-20 NOTE — ED Notes (Signed)
 Attempted emergency contact number 1 and 2 and not able to leave Voicemail.

## 2024-03-20 NOTE — ED Notes (Signed)
 [facility] called to transport patient back to facility. Nurse made aware.

## 2024-03-20 NOTE — ED Provider Notes (Signed)
 " [facility] Emergency Department Provider Note MRN:  [id number]  Arrival date & time: 03/20/24     Chief Complaint   Fall   History of Present Illness   Anthony Ferguson is a 74 y.o. year-old male with a history of A-fib, CAD, HIV presenting to the ED with chief complaint of fall.  Slid off of the bed.  Hit his head.  Takes blood thinners.  Mild neck pain, mild right lower back pain.  Stubbed his right toe as well.  Denies chest pain or shortness of breath, no abdominal pain.  Review of Systems  A thorough review of systems was obtained and all systems are negative except as noted in the HPI and PMH.   Patient's Health History    Past Medical History:  Diagnosis Date   A-fib [facility])    CAD (coronary artery disease)    HIV (human immunodeficiency virus infection) (HCC)    Hypercholesteremia    Hypertension    Type 2 diabetes mellitus (HCC)    Vaccine counseling 01/08/2023    Past Surgical History:  Procedure Laterality Date   BIOPSY  07/14/2022   Procedure: BIOPSY;  Surgeon: Cindie Carlin POUR, DO;  [facility];  Service: Endoscopy;;   CARDIAC SURGERY     CIRCUMCISION     CORONARY ARTERY BYPASS GRAFT     ESOPHAGOGASTRODUODENOSCOPY (EGD) WITH PROPOFOL  N/A 07/14/2022   Procedure: ESOPHAGOGASTRODUODENOSCOPY (EGD) WITH PROPOFOL ;  Surgeon: Cindie Carlin POUR, DO;  [facility];  Service: Endoscopy;  Laterality: N/A;   IR CT HEAD LTD  05/20/2021   IR PERCUTANEOUS ART THROMBECTOMY/INFUSION INTRACRANIAL INC DIAG ANGIO  05/20/2021   RADIOLOGY WITH ANESTHESIA N/A 05/19/2021   Procedure: IR WITH ANESTHESIA;  Surgeon: Dolphus Carrion, MD;  Location: [facility];  Service: Radiology;  Laterality: N/A;    Family History  Problem Relation Age of Onset   Diabetes Mother    Heart failure Mother     Social History   Socioeconomic History   Marital status: Legally Separated    Spouse name: Not on file   Number of children: Not on file   Years of  education: Not on file   Highest education level: Not on file  Occupational History   Not on file  Tobacco Use   Smoking status: Former    Current packs/day: 0.30    Types: Cigarettes   Smokeless tobacco: Never   Tobacco comments:    Trying to slow down  Vaping Use   Vaping status: Never Used  Substance and Sexual Activity   Alcohol use: Yes    Alcohol/week: 5.0 standard drinks of alcohol    Types: 5 Cans of beer per week    Comment: occas   Drug use: Not Currently    Types: Cocaine    Comment: last use 07/22/16   Sexual activity: Not Currently    Partners: Female    Comment: condoms declined  Other Topics Concern   Not on file  Social History Narrative   Not on file   Social Drivers of Health   Tobacco Use: Medium Risk (03/20/2024)   Patient History    Smoking Tobacco Use: Former    Smokeless Tobacco Use: Never    Passive Exposure: Not on Actuary Strain: Not on file  Food Insecurity: No Food Insecurity (07/15/2022)   Hunger Vital Sign    Worried About Running Out of Food in the Last Year: Never true    Ran  Out of Food in the Last Year: Never true  Transportation Needs: No Transportation Needs (07/15/2022)   PRAPARE - Administrator, Civil Service (Medical): No    Lack of Transportation (Non-Medical): No  Physical Activity: Not on file  Stress: Not on file  Social Connections: Not on file  Intimate Partner Violence: Not At Risk (07/15/2022)   Humiliation, Afraid, Rape, and Kick questionnaire    Fear of Current or Ex-Partner: No    Emotionally Abused: No    Physically Abused: No    Sexually Abused: No  Depression (PHQ2-9): Low Risk (02/01/2023)   Depression (PHQ2-9)    PHQ-2 Score: 0  Alcohol Screen: Not on file  Housing: Low Risk (07/15/2022)   Housing    Last Housing Risk Score: 0  Utilities: Not At Risk (07/15/2022)   [facility] Utilities    Threatened with loss of utilities: No  Health Literacy: Not on file     Physical Exam    Vitals:   03/20/24 0630 03/20/24 0645  BP: (!) 140/84 139/83  Pulse: 72 72  Resp:  14  Temp:    SpO2: 98% 97%    CONSTITUTIONAL: Chronically ill-appearing, NAD NEURO/PSYCH:  Alert and oriented x 3, no focal deficits EYES:  eyes equal and reactive ENT/NECK:  no LAD, no JVD CARDIO: Regular rate, well-perfused, normal S1 and S2 PULM:  CTAB no wheezing or rhonchi GI/GU:  non-distended, non-tender MSK/SPINE:  No gross deformities, no edema SKIN:  no rash, atraumatic   *Additional and/or pertinent findings included in MDM below  Diagnostic and Interventional Summary    EKG Interpretation Date/Time:    Ventricular Rate:    PR Interval:    QRS Duration:    QT Interval:    QTC Calculation:   R Axis:      Text Interpretation:         Labs Reviewed - No data to display  DG Pelvis 1-2 Views  Final Result    DG Lumbar Spine Complete  Final Result    DG Toe Great Right  Final Result    CT HEAD WO CONTRAST ( )  Final Result    CT CERVICAL SPINE WO CONTRAST  Final Result      Medications  acetaminophen  (TYLENOL ) tablet 1,000 mg (1,000 mg Oral Given 03/20/24 9386)     Procedures  /  Critical Care Procedures  ED Course and Medical Decision Making  Initial Impression and Ddx Anticoagulated, CT to exclude intracranial bleeding.  Largely nontraumatic on exam.  Past medical/surgical history that increases complexity of ED encounter: A-fib  Interpretation of Diagnostics No significant traumatic injuries on CT/x-ray  Patient Reassessment and Ultimate Disposition/Management     Patient doing well on reassessment, no indication for further testing or admission, plan is for discharge.  Patient management required discussion with the following services or consulting groups:  None  Complexity of Problems Addressed Acute illness or injury that poses threat of life of bodily function  Additional Data Reviewed and Analyzed Further history obtained from: Prior  labs/imaging results  Additional Factors Impacting ED Encounter Risk Consideration of hospitalization  Ozell HERO. Theadore, MD [facility] Emergency Medicine [facility] [email or web address]  Final Clinical Impressions(s) / ED Diagnoses     ICD-10-CM   1. Fall, initial encounter  W[facility]       ED Discharge Orders     None        Discharge Instructions Discussed with and Provided to Patient:  Discharge Instructions      You were evaluated in the Emergency Department and after careful evaluation, we did not find any emergent condition requiring admission or further testing in the hospital.  Your exam/testing today was overall reassuring.  No significant injuries.  Can use Tylenol  at home for lingering discomfort.  Follow-up with your primary care doctor.  Please return to the Emergency Department if you experience any worsening of your condition.  Thank you for allowing us  to be a part of your care.        Theadore Ozell HERO, MD 03/20/24 [phone]  "

## 2024-03-20 NOTE — ED Triage Notes (Signed)
 [facility] from [facility] for unwitnessed fall. Patient slid out of the bed when trying to go to the bathroom. Staff picked him up and placed him back in the bed.  Has c collar on.  Initially c/o head and back pain but now his only complaint is toe pain from where it got stubbed. On eliquis  A&Ox4

## 2024-03-20 NOTE — Discharge Instructions (Signed)
 You were evaluated in the Emergency Department and after careful evaluation, we did not find any emergent condition requiring admission or further testing in the hospital.  Your exam/testing today was overall reassuring.  No significant injuries.  Can use Tylenol  at home for lingering discomfort.  Follow-up with your primary care doctor.  Please return to the Emergency Department if you experience any worsening of your condition.  Thank you for allowing us  to be a part of your care.
